# Patient Record
Sex: Female | Born: 1995
Health system: Southern US, Community
[De-identification: ages and names within clinical notes are randomized; demographics above are authoritative.]

## PROBLEM LIST (undated history)

## (undated) DIAGNOSIS — O24419 Gestational diabetes mellitus in pregnancy, unspecified control: Secondary | ICD-10-CM

## (undated) DIAGNOSIS — E7800 Pure hypercholesterolemia, unspecified: Secondary | ICD-10-CM

## (undated) DIAGNOSIS — I499 Cardiac arrhythmia, unspecified: Secondary | ICD-10-CM

## (undated) DIAGNOSIS — F419 Anxiety disorder, unspecified: Secondary | ICD-10-CM

## (undated) DIAGNOSIS — Z86718 Personal history of other venous thrombosis and embolism: Secondary | ICD-10-CM

## (undated) DIAGNOSIS — E559 Vitamin D deficiency, unspecified: Secondary | ICD-10-CM

## (undated) HISTORY — DX: Personal history of other venous thrombosis and embolism: Z86.718

## (undated) HISTORY — DX: Vitamin D deficiency, unspecified: E55.9

## (undated) HISTORY — DX: Pure hypercholesterolemia, unspecified: E78.00

## (undated) HISTORY — PX: NO PAST SURGERIES: SHX2092

---
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
## 2015-06-24 ENCOUNTER — Emergency Department: Payer: BLUE CROSS/BLUE SHIELD

## 2015-06-24 ENCOUNTER — Encounter: Payer: Self-pay | Admitting: Medical Oncology

## 2015-06-24 ENCOUNTER — Emergency Department
Admission: EM | Admit: 2015-06-24 | Discharge: 2015-06-24 | Disposition: A | Discharge status: Home / Self Care | Payer: BLUE CROSS/BLUE SHIELD | Attending: Emergency Medicine | Admitting: Emergency Medicine

## 2015-06-24 DIAGNOSIS — R0789 Other chest pain: Secondary | ICD-10-CM | POA: Diagnosis present

## 2015-06-24 DIAGNOSIS — F419 Anxiety disorder, unspecified: Secondary | ICD-10-CM | POA: Insufficient documentation

## 2015-06-24 HISTORY — DX: Anxiety disorder, unspecified: F41.9

## 2015-06-24 LAB — BASIC METABOLIC PANEL
Anion gap: 8 (ref 5–15)
BUN: 13 mg/dL (ref 6–20)
CALCIUM: 9.5 mg/dL (ref 8.9–10.3)
CO2: 25 mmol/L (ref 22–32)
CREATININE: 0.67 mg/dL (ref 0.44–1.00)
Chloride: 106 mmol/L (ref 101–111)
GFR calc Af Amer: >60 mL/min (ref >60)
GFR calc non Af Amer: >60 mL/min (ref >60)
GLUCOSE: 98 mg/dL (ref 65–99)
Potassium: 4 mmol/L (ref 3.5–5.1)
Sodium: 139 mmol/L (ref 135–145)

## 2015-06-24 LAB — CBC WITH DIFFERENTIAL/PLATELET
BASOS PCT: 1 %
Basophils Absolute: 0.1 10*3/uL (ref 0–0.1)
EOS ABS: 0 10*3/uL (ref 0–0.7)
Eosinophils Relative: 0 %
HEMATOCRIT: 38.5 % (ref 35.0–47.0)
Hemoglobin: 13.2 g/dL (ref 12.0–16.0)
Lymphocytes Relative: 12 %
Lymphs Abs: 1.2 10*3/uL (ref 1.0–3.6)
MCH: 30.1 pg (ref 26.0–34.0)
MCHC: 34.2 g/dL (ref 32.0–36.0)
MCV: 88.1 fL (ref 80.0–100.0)
MONO ABS: 0.7 10*3/uL (ref 0.2–0.9)
MONOS PCT: 7 %
NEUTROS ABS: 8.4 10*3/uL — AB (ref 1.4–6.5)
Neutrophils Relative %: 80 %
Platelets: 271 10*3/uL (ref 150–440)
RBC: 4.37 MIL/uL (ref 3.80–5.20)
RDW: 13.2 % (ref 11.5–14.5)
WBC: 10.5 10*3/uL (ref 3.6–11.0)

## 2015-06-24 LAB — TSH: TSH: 3.573 u[IU]/mL (ref 0.350–4.500)

## 2015-06-24 LAB — TROPONIN I: Troponin I: <0.03 ng/mL (ref <0.031)

## 2015-06-24 LAB — FIBRIN DERIVATIVES D-DIMER (ARMC ONLY): Fibrin derivatives D-dimer (ARMC): 207 (ref 0–499)

## 2015-06-24 MED ORDER — LORAZEPAM 2 MG/ML IJ SOLN
INTRAMUSCULAR | Status: AC
  Filled 2015-06-24: qty 1

## 2015-06-24 MED ORDER — LORAZEPAM 1 MG PO TABS
1.0000 mg | ORAL_TABLET | Freq: Once | ORAL | Status: AC
  Administered 2015-06-24: 1 mg via ORAL
  Filled 2015-06-24: qty 1

## 2015-06-24 MED ORDER — LORAZEPAM 1 MG PO TABS: 1.0000 mg | ORAL_TABLET | Freq: Three times a day (TID) | ORAL | Status: AC | PRN

## 2015-06-24 NOTE — ED Notes (Signed)
3 RN's attempted to draw blood from pt, unsuccessful, lab notified and asked to come to the bedside from lab draw

## 2015-06-24 NOTE — ED Notes (Signed)
Pt states she started zoloft yesterday, states a lot of anxiety with planning a wedding, a new job, and trying to buy a house, states some SOB, dizziness, and trouble swallowing, pt awake and alert in no acute distress, speaking in full clear sentances

## 2015-06-24 NOTE — ED Provider Notes (Signed)
6:06 PM on 06/24/2015 -----------------------------------------   I have reviewed the triage notes  Chief Complaint: Shortness of Breath and Chest Pain   History of Present Illness: Stephanie Johns is a 19 y.o. female who presents with feelings of anxiety while she was driving today. Patient states she started breathing very fast felt lightheaded, difficulty swallowing she's been seen by her primary physician is started on Zoloft and Nexium for possible reflux. Patient states she's only had 1 dose of each of the medications at this time. She denies any pulmonary emboli risk factors such as recent travel, birth control pills, history of deep venous thrombosis, etc. Patient denies any family history of early cardiovascular disease. She states she had some mild chest discomfort over the past week intermittently, but denies any persistent pain at this time.   Past Medical History  Diagnosis Date  . Anxiety     There are no active problems to display for this patient.   History reviewed. No pertinent past surgical history.  History reviewed. No pertinent past surgical history.  No current outpatient prescriptions on file.  Allergies:  Review of patient's allergies indicates no known allergies.  Family History: No family history on file.  Social History: Social History  Substance Use Topics  . Smoking status: Never Smoker   . Smokeless tobacco: None  . Alcohol Use: No     Review of Systems:   10 point review of systems was performed and was otherwise negative:  Constitutional: No fever Eyes: No visual disturbances ENT: No sore throat, ear pain Cardiac: No chest pain Respiratory: No shortness of breath, wheezing, or stridor Abdomen: No abdominal pain, no vomiting, No diarrhea Endocrine: No weight loss, No night sweats Extremities: No peripheral edema, cyanosis Skin: No rashes, easy bruising Neurologic: No focal weakness, trouble with speech or  swollowing Urologic: No dysuria, Hematuria, or urinary frequency   Physical Exam:  ED Triage Vitals  Enc Vitals Group     BP 06/24/15 1719 140/84 mmHg     Pulse Rate 06/24/15 1719 96     Resp 06/24/15 1719 20     Temp 06/24/15 1719 98.4 F (36.9 C)     Temp Source 06/24/15 1719 Oral     SpO2 06/24/15 1719 98 %     Weight 06/24/15 1719 205 lb (92.987 kg)     Height 06/24/15 1719 5\' 7"  (1.702 m)     Head Cir --      Peak Flow --      Pain Score 06/24/15 1720 0     Pain Loc --      Pain Edu? --      Excl. in GC? --     General: Awake , Alert , and Oriented times 3; GCS 15 Head: Normal cephalic , atraumatic Eyes: Pupils equal , round, reactive to light Nose/Throat: No nasal drainage, patent upper airway without erythema or exudate.  Neck: Supple, Full range of motion, No anterior adenopathy or palpable thyroid masses Lungs: Clear to ascultation without wheezes , rhonchi, or rales Heart: Regular rate, regular rhythm without murmurs , gallops , or rubs Abdomen: Soft, non tender without rebound, guarding , or rigidity; bowel sounds positive and symmetric in all 4 quadrants. No organomegaly .        Extremities: 2 plus symmetric pulses. No edema, clubbing or cyanosis Neurologic: normal ambulation, Motor symmetric without deficits, sensory intact Skin: warm, dry, no rashes   Labs:   All laboratory work was reviewed including  any pertinent negatives or positives listed below:  Labs Reviewed  BASIC METABOLIC PANEL  CBC WITH DIFFERENTIAL/PLATELET  TROPONIN I  TSH  FIBRIN DERIVATIVES D-DIMER ([facility] ONLY)   laboratory work was reviewed with no significant findings  EKG:  ED ECG REPORT I, Jennye Moccasin, the attending physician, personally viewed and interpreted this ECG.  Date: 06/24/2015 EKG Time: 1723 Rate: 79 Rhythm: normal sinus rhythm QRS Axis: normal Intervals: normal ST/T Wave abnormalities: normal Conduction Disutrbances: none Narrative Interpretation:  unremarkable Normal EKG   Radiology:  Narrative:    CLINICAL DATA: One week history of mid to right-sided chest pain.  EXAM: CHEST 2 VIEW  COMPARISON: None.  FINDINGS: The heart size and mediastinal contours are within normal limits. Both lungs are clear. The visualized skeletal structures are unremarkable.  IMPRESSION: Normal chest x-ray.           I personally reviewed the radiologic studies     ED Course:  Patient's stay here was uneventful and did not appear to have any clinical or objective findings of a immediate life threat causing her chest discomfort and dyspnea. Patient I felt most likely was having anxiety/panic attacks. Patient is currently on Zoloft and also Nexium for possible reflux and I added Ativan for the time being until her Zoloft becomes therapeutic. She was also given bedside consultation concerning abilities to calm herself down on her own without medication. She was advised to return here if she had any new concerns or any suicidal thoughts, homicidal thoughts, hallucinations.  Assessment:  Anxiety/panic attacks     Plan:  Outpatient management Patient was advised to return immediately if condition worsens. Patient was advised to follow up with her primary care physician or other specialized physicians involved and in their current assessment.             Jennye Moccasin, MD 06/24/15 2020

## 2015-06-24 NOTE — Discharge Instructions (Signed)
Panic Attacks Panic attacks are sudden, short feelings of great fear or discomfort. You may have them for no reason when you are relaxed, when you are uneasy (anxious), or when you are sleeping.  HOME CARE  Take all your medicines as told.  Check with your doctor before starting new medicines.  Keep all doctor visits. GET HELP IF:  You are not able to take your medicines as told.  Your symptoms do not get better.  Your symptoms get worse. GET HELP RIGHT AWAY IF:  Your attacks seem different than your normal attacks.  You have thoughts about hurting yourself or others.  You take panic attack medicine and you have a side effect. MAKE SURE YOU:  Understand these instructions.  Will watch your condition.  Will get help right away if you are not doing well or get worse.   This information is not intended to replace advice given to you by your health care provider. Make sure you discuss any questions you have with your health care provider.   Document Released: 09/01/2010 Document Revised: 05/20/2013 Document Reviewed: 03/13/2013 Elsevier Interactive Patient Education [employer]2016 [employer].  Please return immediately if condition worsens. Please contact her primary physician or the physician you were given for referral. If you have any specialist physicians involved in her treatment and plan please also contact them. Thank you for using [facility].

## 2015-06-24 NOTE — ED Notes (Signed)
Labs drawn by lab.

## 2015-06-24 NOTE — ED Notes (Signed)
Pt tearful in triage with reports that she has been having chest pain and sob x 1 week, went to PCP a few days ago and was started on zoloft. Pt reports that she feels anxious.

## 2016-01-06 ENCOUNTER — Encounter: Payer: Self-pay | Admitting: Emergency Medicine

## 2016-01-06 ENCOUNTER — Emergency Department: Payer: BLUE CROSS/BLUE SHIELD

## 2016-01-06 ENCOUNTER — Emergency Department
Admission: EM | Admit: 2016-01-06 | Discharge: 2016-01-06 | Disposition: A | Discharge status: Home / Self Care | Payer: BLUE CROSS/BLUE SHIELD | Attending: Emergency Medicine | Admitting: Emergency Medicine

## 2016-01-06 DIAGNOSIS — Z79899 Other long term (current) drug therapy: Secondary | ICD-10-CM | POA: Insufficient documentation

## 2016-01-06 DIAGNOSIS — O209 Hemorrhage in early pregnancy, unspecified: Secondary | ICD-10-CM | POA: Diagnosis present

## 2016-01-06 DIAGNOSIS — Z3A01 Less than 8 weeks gestation of pregnancy: Secondary | ICD-10-CM | POA: Diagnosis not present

## 2016-01-06 DIAGNOSIS — O200 Threatened abortion: Secondary | ICD-10-CM | POA: Diagnosis not present

## 2016-01-06 DIAGNOSIS — R58 Hemorrhage, not elsewhere classified: Secondary | ICD-10-CM

## 2016-01-06 LAB — CBC WITH DIFFERENTIAL/PLATELET
BASOS ABS: 0 10*3/uL (ref 0–0.1)
Basophils Relative: 1 %
Eosinophils Absolute: 0 10*3/uL (ref 0–0.7)
Eosinophils Relative: 0 %
HEMATOCRIT: 36.9 % (ref 35.0–47.0)
HEMOGLOBIN: 12.6 g/dL (ref 12.0–16.0)
Lymphs Abs: 1.8 10*3/uL (ref 1.0–3.6)
MCH: 29.5 pg (ref 26.0–34.0)
MCHC: 34.1 g/dL (ref 32.0–36.0)
MCV: 86.7 fL (ref 80.0–100.0)
MONO ABS: 0.8 10*3/uL (ref 0.2–0.9)
Monocytes Relative: 10 %
NEUTROS ABS: 5.4 10*3/uL (ref 1.4–6.5)
Platelets: 276 10*3/uL (ref 150–440)
RBC: 4.26 MIL/uL (ref 3.80–5.20)
RDW: 13.6 % (ref 11.5–14.5)
WBC: 8 10*3/uL (ref 3.6–11.0)

## 2016-01-06 LAB — HCG, QUANTITATIVE, PREGNANCY: HCG, BETA CHAIN, QUANT, S: 4 m[IU]/mL (ref <5)

## 2016-01-06 NOTE — ED Provider Notes (Signed)
[facility] Emergency Department Provider Note  ____________________________________________  Time seen: Approximately 3:32 PM  I have reviewed the triage vital signs and the nursing notes.   HISTORY  Chief Complaint Vaginal Bleeding    HPI Stephanie Johns is a 20 y.o. female who presents to the emergency department for evaluation of vaginal bleeding. She reports that she has had a positive pregnancy test. Her last menstrual cycle was 11/13/2015. She states that she has been spotting for the past 2 weeks with a cramping in her lower abdomen. Today she's had an increase in bleeding.She was started on Xarelto 3 months ago due to DVT. PCP consulted with Dr. Jean RosenthalJackson and she was started on Lovenox and stopped the Xarelto. Results from labs drawn earlier were HCG of 13.1.   Past Medical History  Diagnosis Date  . Anxiety     There are no active problems to display for this patient.   History reviewed. No pertinent past surgical history.  Current Outpatient Rx  Name  Route  Sig  Dispense  Refill  . enoxaparin (LOVENOX) 100 MG/ML injection   Subcutaneous   Inject 100 mg into the skin.         Marland Kitchen. LORazepam (ATIVAN) 1 MG tablet   Oral   Take 1 tablet (1 mg total) by mouth every 8 (eight) hours as needed for anxiety.   30 tablet   0     Allergies Review of patient's allergies indicates no known allergies.  No family history on file.  Social History Social History  Substance Use Topics  . Smoking status: Never Smoker   . Smokeless tobacco: None  . Alcohol Use: No    Review of Systems Constitutional: Negative for fever. Respiratory: Negative for shortness of breath or cough. Gastrointestinal: Positive for abdominal pain; negative for nausea , negative for vomiting. Genitourinary: Negative for dysuria , positive for vaginal bleeding. Musculoskeletal: Negative for back pain. Skin:  Atraumatic. ____________________________________________   PHYSICAL EXAM:  VITAL SIGNS: ED Triage Vitals  Enc Vitals Group     BP 01/06/16 1411 135/78 mmHg     Pulse Rate 01/06/16 1411 103     Resp 01/06/16 1411 18     Temp 01/06/16 1411 98.4 F (36.9 C)     Temp Source 01/06/16 1411 Oral     SpO2 01/06/16 1411 100 %     Weight 01/06/16 1411 220 lb (99.791 kg)     Height 01/06/16 1411 5\' 7"  (1.702 m)     Head Cir --      Peak Flow --      Pain Score 01/06/16 1412 5     Pain Loc --      Pain Edu? --      Excl. in GC? --     Constitutional: Alert and oriented. Well appearing and in no acute distress. Eyes: Conjunctivae are normal. PERRL. EOMI. Head: Atraumatic. Nose: No congestion/rhinnorhea. Mouth/Throat: Mucous membranes are moist. Respiratory: Normal respiratory effort.  No retractions. Gastrointestinal: Soft, no guarding or rebound tenderness. Mild suprapubic tenderness on exam. Genitourinary: Pelvic exam: Deferred Musculoskeletal: No extremity tenderness nor edema.  Neurologic:  Normal speech and language. No gross focal neurologic deficits are appreciated. Speech is normal. No gait instability. Skin:  Skin is warm, dry and intact. No rash noted. Psychiatric: Mood and affect are normal. Speech and behavior are normal.  ____________________________________________   LABS (all labs ordered are listed, but only abnormal results are displayed)  Labs Reviewed  HCG, QUANTITATIVE,  PREGNANCY  CBC WITH DIFFERENTIAL/PLATELET   ____________________________________________  RADIOLOGY  No IUP or ectopic pregnancy identified today. Per radiology. ____________________________________________   PROCEDURES  Procedure(s) performed: None  ____________________________________________   INITIAL IMPRESSION / ASSESSMENT AND PLAN / ED COURSE  Pertinent labs & imaging results that were available during my care of the patient were reviewed by me and considered in my medical  decision making (see chart for details).  She was advised to follow up with Dr. Jean Rosenthal. She was advised that she will need to have a repeat HCG and [place]. She was advised to adhere to pelvic rest while bleeding and/or cramping.  She was given strict return precautions to return to the ER for increase in bleeding or abdominal pain. ____________________________________________   FINAL CLINICAL IMPRESSION(S) / ED DIAGNOSES  Final diagnoses:  Threatened miscarriage in early pregnancy    Note:  This document was prepared using Dragon voice recognition software and may include unintentional dictation errors.   Chinita Pester, FNP 01/06/16 1727  Richardean Canal, MD 01/10/16 [id number]

## 2016-01-06 NOTE — Discharge Instructions (Signed)

## 2016-01-06 NOTE — ED Notes (Signed)
States she has had positive pregnancy test   Then developed some vaginal bleeding  States she did pass some clots  But also is currently on blood thinner

## 2016-01-06 NOTE — ED Notes (Addendum)
LMP April 2nd, Blood test to confirm pregnancy , x2 weeks spotting with cramping feeling, today increased bleeding "not a pad an hour, but close". Nausea x2 days. Was dx with DVT in left leg with current treatment with SQ Enoxaparin

## 2016-01-13 ENCOUNTER — Encounter: Payer: Self-pay | Admitting: Internal Medicine

## 2016-01-13 ENCOUNTER — Inpatient Hospital Stay: Payer: BLUE CROSS/BLUE SHIELD

## 2016-01-13 ENCOUNTER — Inpatient Hospital Stay: Payer: BLUE CROSS/BLUE SHIELD | Attending: Internal Medicine | Admitting: Internal Medicine

## 2016-01-13 VITALS — BP 123/85 | HR 96 | Temp 99.1°F | Resp 18 | Wt 221.8 lb

## 2016-01-13 DIAGNOSIS — O039 Complete or unspecified spontaneous abortion without complication: Secondary | ICD-10-CM

## 2016-01-13 DIAGNOSIS — F419 Anxiety disorder, unspecified: Secondary | ICD-10-CM | POA: Insufficient documentation

## 2016-01-13 DIAGNOSIS — D6859 Other primary thrombophilia: Secondary | ICD-10-CM

## 2016-01-13 DIAGNOSIS — Z86718 Personal history of other venous thrombosis and embolism: Secondary | ICD-10-CM | POA: Insufficient documentation

## 2016-01-13 DIAGNOSIS — Z7901 Long term (current) use of anticoagulants: Secondary | ICD-10-CM | POA: Insufficient documentation

## 2016-01-13 DIAGNOSIS — Z79899 Other long term (current) drug therapy: Secondary | ICD-10-CM

## 2016-01-13 LAB — COMPREHENSIVE METABOLIC PANEL
ALT: 67 U/L — AB (ref 14–54)
AST: 35 U/L (ref 15–41)
Albumin: 4.7 g/dL (ref 3.5–5.0)
Alkaline Phosphatase: 66 U/L (ref 38–126)
Anion gap: 8 (ref 5–15)
BILIRUBIN TOTAL: 0.4 mg/dL (ref 0.3–1.2)
BUN: 14 mg/dL (ref 6–20)
CO2: 24 mmol/L (ref 22–32)
CREATININE: 0.58 mg/dL (ref 0.44–1.00)
Calcium: 9.3 mg/dL (ref 8.9–10.3)
Chloride: 104 mmol/L (ref 101–111)
GFR calc Af Amer: >60 mL/min (ref >60)
Glucose, Bld: 102 mg/dL — ABNORMAL HIGH (ref 65–99)
POTASSIUM: 3.7 mmol/L (ref 3.5–5.1)
Sodium: 136 mmol/L (ref 135–145)
TOTAL PROTEIN: 8.4 g/dL — AB (ref 6.5–8.1)

## 2016-01-13 LAB — CBC WITH DIFFERENTIAL/PLATELET
BASOS ABS: 0.1 10*3/uL (ref 0–0.1)
Basophils Relative: 1 %
Eosinophils Absolute: 0 10*3/uL (ref 0–0.7)
Eosinophils Relative: 0 %
HEMATOCRIT: 36.5 % (ref 35.0–47.0)
Hemoglobin: 12.8 g/dL (ref 12.0–16.0)
LYMPHS ABS: 2.5 10*3/uL (ref 1.0–3.6)
LYMPHS PCT: 25 %
MCH: 30.2 pg (ref 26.0–34.0)
MCHC: 35 g/dL (ref 32.0–36.0)
MCV: 86.1 fL (ref 80.0–100.0)
MONO ABS: 0.8 10*3/uL (ref 0.2–0.9)
Monocytes Relative: 8 %
NEUTROS ABS: 6.4 10*3/uL (ref 1.4–6.5)
Neutrophils Relative %: 66 %
Platelets: 282 10*3/uL (ref 150–440)
RBC: 4.24 MIL/uL (ref 3.80–5.20)
RDW: 13.6 % (ref 11.5–14.5)
WBC: 9.9 10*3/uL (ref 3.6–11.0)

## 2016-01-13 LAB — LACTATE DEHYDROGENASE: LDH: 173 U/L (ref 98–192)

## 2016-01-13 NOTE — Progress Notes (Signed)
[facility] CONSULT NOTE  Patient Care Team: Imelda Pillow, NP as PCP - General (Nurse Practitioner)  CHIEF COMPLAINTS/PURPOSE OF CONSULTATION:   # LEFT LE DVT [March 31st 2017]- small segmental thrombus in GSV/ partial thrombus in distal segment of SFV; May 2017- chronic thrombus in SFV  HISTORY OF PRESENTING ILLNESS:  Stephanie Johns 20 y.o.  female pleasant patient referred to [place] for further evaluation and recommendations for DVT of the left lower extremity.  Patient noted to have cramping in her left lower extremity- which led to an ultrasound as described above- noted to have DVT. Patient was placed on Xarelto. She had been tolerating it fairly well.  Approximate 2 weeks ago patient was confirmed pregnant; when Xarelto was switched over to Lovenox. However more recently patient had spontaneous abortion. She is currently on Lovenox.  Patient denies otherwise any red stools black stools. Denies any previous blood clots. Denies any family history of blood clots.  ROS: A complete 10 point review of system is done which is negative except mentioned above in history of present illness  MEDICAL HISTORY:  Past Medical History  Diagnosis Date  . Anxiety     SURGICAL HISTORY: No past surgical history on file.  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 No alcohol. Social History   Social History  . Marital Status: Single    Spouse Name: N/A  . Number of Children: N/A  . Years of Education: N/A   Occupational History  . Not on file.   Social History Main Topics  . Smoking status: Never Smoker   . Smokeless tobacco: Not on file  . Alcohol Use: No  . Drug Use: No  . Sexual Activity: Not on file   Other Topics Concern  . Not on file   Social History Narrative    FAMILY HISTORY:No blood clots in the family. No family history on file.  ALLERGIES:  has No Known Allergies.  MEDICATIONS:  Current Outpatient Prescriptions  Medication Sig Dispense  Refill  . enoxaparin (LOVENOX) 100 MG/ML injection Inject 100 mg into the skin.    Marland Kitchen LORazepam (ATIVAN) 1 MG tablet Take 1 tablet (1 mg total) by mouth every 8 (eight) hours as needed for anxiety. (Patient not taking: Reported on 01/13/2016) 30 tablet 0  . XARELTO 20 MG TABS tablet Reported on 01/13/2016  1   No current facility-administered medications for this visit.      Marland Kitchen  PHYSICAL EXAMINATION:   Filed Vitals:   01/13/16 1343  BP: 123/85  Pulse: 96  Temp: 99.1 F (37.3 C)  Resp: 18   Filed Weights   01/13/16 1343  Weight: 221 lb 12.5 oz (100.6 kg)    GENERAL: Well-nourished well-developed; Alert, no distress and comfortable. With family.  EYES: no pallor or icterus OROPHARYNX: no thrush or ulceration; good dentition  NECK: supple, no masses felt LYMPH:  no palpable lymphadenopathy in the cervical, axillary or inguinal regions LUNGS: clear to auscultation and  No wheeze or crackles HEART/CVS: regular rate & rhythm and no murmurs; No lower extremity edema ABDOMEN: abdomen soft, non-tender and normal bowel sounds Musculoskeletal:no cyanosis of digits and no clubbing  PSYCH: alert & oriented x 3 with fluent speech NEURO: no focal motor/sensory deficits SKIN:  no rashes or significant lesions  LABORATORY DATA:  I have reviewed the data as listed Lab Results  Component Value Date   WBC 9.9 01/13/2016   HGB 12.8 01/13/2016   HCT 36.5 01/13/2016   MCV  86.1 01/13/2016   PLT 282 01/13/2016    Recent Labs  06/24/15 1852 01/13/16 1410  NA 139 136  K 4.0 3.7  CL 106 104  CO2 25 24  GLUCOSE 98 102*  BUN 13 14  CREATININE 0.67 0.58  CALCIUM 9.5 9.3  GFRNONAA >60 >60  GFRAA >60 >60  PROT  --  8.4*  ALBUMIN  --  4.7  AST  --  35  ALT  --  67*  ALKPHOS  --  66  BILITOT  --  0.4    RADIOGRAPHIC STUDIES: I have personally reviewed the radiological images as listed and agreed with the findings in the report. US Ob Comp Less 14 Wks  01/06/2016  CLINICAL DATA:   Cramping and bleeding. Nausea for 2 days. Quantitative beta HCG is 4.0. LMP was 11/21/2015. Gestational age by LMP is6 weeks 4 days. EDC by LMP is01/15/2018 the. EXAM: OBSTETRIC <14 WK [place] AND TRANSVAGINAL OB US TECHNIQUE: Both transabdominal and transvaginal ultrasound examinations were performed for complete evaluation of the gestation as well as the maternal uterus, adnexal regions, and pelvic cul-de-sac. Transvaginal technique was performed to assess early pregnancy. COMPARISON:  None. FINDINGS: Intrauterine gestational sac: Not seen Yolk sac:  Not seen Embryo:  Not seen Cardiac Activity: Not seen Subchorionic hemorrhage:  None visualized. Maternal uterus/adnexae: Ovaries have a normal appearance. Endometrial thickening is noted. IMPRESSION: 1. No intrauterine or ectopic pregnancy identified. 2. Considerations include recent spontaneous abortion, intrauterine pregnancy too small to be visualized, or early ectopic pregnancy too small to visualized. 3. Serial quantitative beta HCG values and follow-up ultrasound are recommended as appropriate to document progression of and location of pregnancy. Ectopic pregnancy has not been excluded. Electronically Signed   By: Norva Pavlov M.D.   On: 01/06/2016 17:04   US Ob Transvaginal  01/06/2016  CLINICAL DATA:  Cramping and bleeding. Nausea for 2 days. Quantitative beta HCG is 4.0. LMP was 11/21/2015. Gestational age by LMP is6 weeks 4 days. EDC by LMP is01/15/2018 the. EXAM: OBSTETRIC <14 WK [place] AND TRANSVAGINAL OB US TECHNIQUE: Both transabdominal and transvaginal ultrasound examinations were performed for complete evaluation of the gestation as well as the maternal uterus, adnexal regions, and pelvic cul-de-sac. Transvaginal technique was performed to assess early pregnancy. COMPARISON:  None. FINDINGS: Intrauterine gestational sac: Not seen Yolk sac:  Not seen Embryo:  Not seen Cardiac Activity: Not seen Subchorionic hemorrhage:  None visualized. Maternal  uterus/adnexae: Ovaries have a normal appearance. Endometrial thickening is noted. IMPRESSION: 1. No intrauterine or ectopic pregnancy identified. 2. Considerations include recent spontaneous abortion, intrauterine pregnancy too small to be visualized, or early ectopic pregnancy too small to visualized. 3. Serial quantitative beta HCG values and follow-up ultrasound are recommended as appropriate to document progression of and location of pregnancy. Ectopic pregnancy has not been excluded. Electronically Signed   By: Norva Pavlov M.D.   On: 01/06/2016 17:04    ASSESSMENT & PLAN:   # Unprovoked DVT below the knee left lower extremity- on Xarelto; most recently on Lovenox [recent pregnancy diagnosis]. The etiology is unclear. Recommend antiphospholipid antibody panel prothrombin gene mutation factor V Leiden. Discussed with the patient that in spite of the workup- still might be idiopathic. However I would recommend only a total of 3 months of anticoagulation. We will discussed regarding stopping anticoagulation at next visit.  # Recent pregnancy/ spontaneous abortion. Clinically does not appear to be related to antiphospholipid antibody syndrome. Await above workup. Check CBC CMP and LDH.  All questions were answered. The patient knows to call the clinic with any problems, questions or concerns. Patient will follow-up in approximately 2 weeks to review the results.  Thank you Dr.Khan for allowing me to participate in the care of your pleasant patient. Please do not hesitate to contact me with questions or concerns in the interim.  # 30 minutes face-to-face with the patient discussing the above plan of care; more than 50% of time spent on counseling and coordination.    Earna CoderGovinda R Delara Shepheard, MD 01/13/2016 4:22 PM

## 2016-01-13 NOTE — Progress Notes (Signed)
Patient here today as new evaluation for DVT.  Referred by Rockney Gheehelsea Holland, NP @ [facility].  Patient states she had a miscarriage last Friday.  She had a a DVT behind left knee since March. Patient on 100 mg Lovenox twice daily. Has script for Xarelto but has not filled it due to being on Lovenox.

## 2016-01-18 LAB — FACTOR 5 LEIDEN

## 2016-01-18 LAB — HEXAGONAL PHASE PHOSPHOLIPID: HEXAGONAL PHASE PHOSPHOLIPID: 2 s (ref 0–11)

## 2016-01-18 LAB — ANTIPHOSPHOLIPID SYNDROME PROF
DRVVT: 32.8 s (ref 0.0–47.0)
PTT LA: 46.4 s — AB (ref 0.0–43.6)

## 2016-01-18 LAB — PROTHROMBIN GENE MUTATION

## 2016-01-18 LAB — PTT-LA MIX: PTT-LA Mix: 44.5 s — ABNORMAL HIGH (ref 0.0–40.6)

## 2016-02-02 ENCOUNTER — Inpatient Hospital Stay (HOSPITAL_BASED_OUTPATIENT_CLINIC_OR_DEPARTMENT_OTHER): Payer: BLUE CROSS/BLUE SHIELD | Admitting: Internal Medicine

## 2016-02-02 VITALS — BP 141/87 | HR 113 | Temp 98.1°F | Resp 18 | Wt 223.1 lb

## 2016-02-02 DIAGNOSIS — Z86718 Personal history of other venous thrombosis and embolism: Secondary | ICD-10-CM

## 2016-02-02 DIAGNOSIS — Z79899 Other long term (current) drug therapy: Secondary | ICD-10-CM | POA: Diagnosis not present

## 2016-02-02 DIAGNOSIS — I824Z2 Acute embolism and thrombosis of unspecified deep veins of left distal lower extremity: Secondary | ICD-10-CM | POA: Insufficient documentation

## 2016-02-02 DIAGNOSIS — Z7901 Long term (current) use of anticoagulants: Secondary | ICD-10-CM

## 2016-02-02 NOTE — Progress Notes (Signed)
[facility] CONSULT NOTE  Patient Care Team: Imelda Pillowhelsa Holland, NP as PCP - General (Nurse Practitioner)  CHIEF COMPLAINTS/PURPOSE OF CONSULTATION:   # LEFT LE DVT [March 31st 2017]- small segmental thrombus in GSV/ partial thrombus in distal segment of SFV; May 2017- chronic thrombus in SFV  HISTORY OF PRESENTING ILLNESS:  Burton ApleyKristen N Conklin 20 y.o.  female pleasant patient with history for DVT of the left lower extremity/review the results of her blood work. Patient had initially been on Xarelto; given the early pregnancy she was switched over to Lovenox. Patient continues to be on Lovenox at this time. Patient had an abortion.   Patient denies otherwise any red stools black stools. Denies any previous blood clots. Denies any family history of blood clots.  ROS: A complete 10 point review of system is done which is negative except mentioned above in history of present illness  MEDICAL HISTORY:  Past Medical History  Diagnosis Date  . Anxiety     SURGICAL HISTORY: No past surgical history on file.  SOCIAL HISTORY: Works Industrial/product designermedical office. No smoking. No alcohol. Social History   Social History  . Marital Status: Single    Spouse Name: N/A  . Number of Children: N/A  . Years of Education: N/A   Occupational History  . Not on file.   Social History Main Topics  . Smoking status: Never Smoker   . Smokeless tobacco: Not on file  . Alcohol Use: No  . Drug Use: No  . Sexual Activity: Not on file   Other Topics Concern  . Not on file   Social History Narrative    FAMILY HISTORY:No blood clots in the family. No family history on file.  ALLERGIES:  has No Known Allergies.  MEDICATIONS:  Current Outpatient Prescriptions  Medication Sig Dispense Refill  . enoxaparin (LOVENOX) 100 MG/ML injection Inject 100 mg into the skin.    Marland Kitchen. LORazepam (ATIVAN) 1 MG tablet Take 1 tablet (1 mg total) by mouth every 8 (eight) hours as needed for anxiety. 30 tablet 0  .  XARELTO 20 MG TABS tablet Reported on 01/13/2016  1   No current facility-administered medications for this visit.      Marland Kitchen.  PHYSICAL EXAMINATION:   Filed Vitals:   02/02/16 1531  BP: 141/87  Pulse: 113  Temp: 98.1 F (36.7 C)  Resp: 18   Filed Weights   02/02/16 1531  Weight: 223 lb 1.7 oz (101.2 kg)    GENERAL: Well-nourished well-developed; Alert, no distress and comfortable. With family.    LABORATORY DATA:  I have reviewed the data as listed Lab Results  Component Value Date   WBC 9.9 01/13/2016   HGB 12.8 01/13/2016   HCT 36.5 01/13/2016   MCV 86.1 01/13/2016   PLT 282 01/13/2016    Recent Labs  06/24/15 1852 01/13/16 1410  NA 139 136  K 4.0 3.7  CL 106 104  CO2 25 24  GLUCOSE 98 102*  BUN 13 14  CREATININE 0.67 0.58  CALCIUM 9.5 9.3  GFRNONAA >60 >60  GFRAA >60 >60  PROT  --  8.4*  ALBUMIN  --  4.7  AST  --  35  ALT  --  67*  ALKPHOS  --  66  BILITOT  --  0.4    RADIOGRAPHIC STUDIES: I have personally reviewed the radiological images as listed and agreed with the findings in the report. [place] Ob Comp Less 14 Wks  01/06/2016  CLINICAL DATA:  Cramping and bleeding. Nausea for 2 days. Quantitative beta HCG is 4.0. LMP was 11/21/2015. Gestational age by LMP is6 weeks 4 days. EDC by LMP is01/15/2018 the. EXAM: OBSTETRIC <14 WK US AND TRANSVAGINAL OB US TECHNIQUE: Both transabdominal and transvaginal ultrasound examinations were performed for complete evaluation of the gestation as well as the maternal uterus, adnexal regions, and pelvic cul-de-sac. Transvaginal technique was performed to assess early pregnancy. COMPARISON:  None. FINDINGS: Intrauterine gestational sac: Not seen Yolk sac:  Not seen Embryo:  Not seen Cardiac Activity: Not seen Subchorionic hemorrhage:  None visualized. Maternal uterus/adnexae: Ovaries have a normal appearance. Endometrial thickening is noted. IMPRESSION: 1. No intrauterine or ectopic pregnancy identified. 2. Considerations  include recent spontaneous abortion, intrauterine pregnancy too small to be visualized, or early ectopic pregnancy too small to visualized. 3. Serial quantitative beta HCG values and follow-up ultrasound are recommended as appropriate to document progression of and location of pregnancy. Ectopic pregnancy has not been excluded. Electronically Signed   By: Norva PavlovElizabeth  Brown M.D.   On: 01/06/2016 17:04   [place] Ob Transvaginal  01/06/2016  CLINICAL DATA:  Cramping and bleeding. Nausea for 2 days. Quantitative beta HCG is 4.0. LMP was 11/21/2015. Gestational age by LMP is6 weeks 4 days. EDC by LMP is01/15/2018 the. EXAM: OBSTETRIC <14 WK US AND TRANSVAGINAL OB US TECHNIQUE: Both transabdominal and transvaginal ultrasound examinations were performed for complete evaluation of the gestation as well as the maternal uterus, adnexal regions, and pelvic cul-de-sac. Transvaginal technique was performed to assess early pregnancy. COMPARISON:  None. FINDINGS: Intrauterine gestational sac: Not seen Yolk sac:  Not seen Embryo:  Not seen Cardiac Activity: Not seen Subchorionic hemorrhage:  None visualized. Maternal uterus/adnexae: Ovaries have a normal appearance. Endometrial thickening is noted. IMPRESSION: 1. No intrauterine or ectopic pregnancy identified. 2. Considerations include recent spontaneous abortion, intrauterine pregnancy too small to be visualized, or early ectopic pregnancy too small to visualized. 3. Serial quantitative beta HCG values and follow-up ultrasound are recommended as appropriate to document progression of and location of pregnancy. Ectopic pregnancy has not been excluded. Electronically Signed   By: Norva PavlovElizabeth  Brown M.D.   On: 01/06/2016 17:04    ASSESSMENT & PLAN:   Acute deep vein thrombosis (DVT) of distal vein of left lower extremity (HCC) Unclear etiology hypercoagulable workup- prothrombin gene mutation factor V Leiden; antiphospholipid antibody panel negative. Discussed at length with the  patient and family. Patient was given a copy of her labs.  # Given the distal/below knee DVT- I recommend anticoagulation approximately 3-4 months. Patient has finished anticoagulation for 4 months now. Recommend stopping anticoagulation at this time.  # With regards to birth control pills- I would recommend against estrogen-based pills. However, given the risk versus benefit- progestin based BCPs could be prescribed. Patient is again aware that side high-risk of blood clots on any hormone-based therapy. I would recommend if started 6 months from now.     # 15 minutes face-to-face with the patient discussing the above plan of care; more than 50% of time spent on natural history; counseling and coordination.     Earna CoderGovinda R Nicklaus Alviar, MD 02/02/2016 3:36 PM

## 2016-02-02 NOTE — Assessment & Plan Note (Addendum)
Unclear etiology hypercoagulable workup- prothrombin gene mutation factor V Leiden; antiphospholipid antibody panel negative. Discussed at length with the patient and family. Patient was given a copy of her labs.  # Given the distal/below knee DVT- I recommend anticoagulation approximately 3-4 months. Patient has finished anticoagulation for 4 months now. Recommend stopping anticoagulation at this time. Also reviewed the ultrasound done in June 2017 lower leg that she has chronic blood clot.  # With regards to birth control pills- I would recommend against estrogen-based pills. However, given the risk versus benefit- progestin based BCPs could be prescribed. Patient is again aware that side high-risk of blood clots on any hormone-based therapy. I would recommend if started 6 months from now.

## 2016-09-14 ENCOUNTER — Ambulatory Visit (INDEPENDENT_AMBULATORY_CARE_PROVIDER_SITE_OTHER): Payer: 59 | Admitting: Adult Health

## 2016-09-14 ENCOUNTER — Encounter: Payer: Self-pay | Admitting: Adult Health

## 2016-09-14 VITALS — BP 108/70 | HR 86 | Ht 67.0 in | Wt 215.5 lb

## 2016-09-14 DIAGNOSIS — Z86718 Personal history of other venous thrombosis and embolism: Secondary | ICD-10-CM | POA: Insufficient documentation

## 2016-09-14 DIAGNOSIS — N926 Irregular menstruation, unspecified: Secondary | ICD-10-CM | POA: Insufficient documentation

## 2016-09-14 DIAGNOSIS — N946 Dysmenorrhea, unspecified: Secondary | ICD-10-CM | POA: Diagnosis not present

## 2016-09-14 DIAGNOSIS — Z319 Encounter for procreative management, unspecified: Secondary | ICD-10-CM | POA: Insufficient documentation

## 2016-09-14 DIAGNOSIS — Z3202 Encounter for pregnancy test, result negative: Secondary | ICD-10-CM

## 2016-09-14 LAB — POCT URINE PREGNANCY: Preg Test, Ur: NEGATIVE

## 2016-09-14 MED ORDER — PRENATAL PLUS/IRON 27-1 MG PO TABS: ORAL_TABLET | ORAL | 12 refills | Status: DC

## 2016-09-14 NOTE — Progress Notes (Signed)
Subjective:     Patient ID: Stephanie Johns, female   DOB: [birth year], 21 y.o.   MRN: [id number]  HPI Stephanie Johns is a 21 year old white female,married in because period in 3 weeks late, but spotting brown started 1/30. Periods painful at times and LLQ pain at times.No pain with sex.She had DVT last year.Has been married for a year and got pregnant on honeymoon and miscarried. Her mom is worried she may have ovarian cyst.  PCP is Orson Evahelsa Boswell, [place] in [place].  Review of Systems Period late Has pain with periods and some LLQ at times No pain with sex Reviewed past medical,surgical, social and family history. Reviewed medications and allergies.     Objective:   Physical Exam BP 108/70 (BP Location: Left Arm, Patient Position: Sitting, Cuff Size: Normal)   Pulse 86   Ht 5\' 7"  (1.702 m)   Wt 215 lb 8 oz (97.8 kg)   LMP 09/11/2016 Comment: spotting  BMI 33.75 kg/m UPT negative. PHQ 2 score 1. Skin warm and dry.Pelvic: external genitalia is normal in appearance no lesions, vagina: brown blood,urethra has no lesions or masses noted, cervix:smooth and nulliparous, uterus: normal size, shape and contour, non tender, no masses felt, adnexa: no masses or tenderness noted. Bladder is non tender and no masses felt.    Discussed timing of sex, and ovulation, stop lipitor and can take fish oil, will rx prenatal vitamins, she is aware will have to be on lovenox when pregnant due to prior DVT. Face time 30 minutes with 50% counseling, see above.  Assessment:     1. Menstrual period late   2. Pregnancy examination or test, negative result   3. Dysmenorrhea   4. History of DVT (deep vein thrombosis)   5. Patient desires pregnancy       Plan:     Rx prenatal plus #30 take 1 daily with 12 refills Stop Lipitor,can take fish oil Keep period calendar Call me when period starts,will check progesterone level day 21 of cycle Discussed timing of sex  Review handout on preparing for pregnancy Follow  up in 3 months

## 2016-09-14 NOTE — Patient Instructions (Addendum)
Keep period calendar Call me when period starts,will check progesterone level day 21 of cycle Discussed timing of sex  Stop lipitor Fish oil is fine  Preparing for Pregnancy If you are considering becoming pregnant, make an appointment to see your regular health care provider to learn how to prepare for a safe and healthy pregnancy (preconception care). During a preconception care visit, your health care provider will:  Do a complete physical exam, including a Pap test.  Take a complete medical history.  Give you information, answer your questions, and help you resolve problems. Preconception checklist Medical history  Tell your health care provider about any current or past medical conditions. Your pregnancy or your ability to become pregnant may be affected by chronic conditions, such as diabetes, chronic hypertension, and thyroid problems.  Include your family's medical history as well as your partner's medical history.  Tell your health care provider about any history of STIs (sexually transmitted infections).These can affect your pregnancy. In some cases, they can be passed to your baby. Discuss any concerns that you have about STIs.  If indicated, discuss the benefits of genetic testing. This testing will show whether there are any genetic conditions that may be passed from you or your partner to your baby.  Tell your health care provider about:  Any problems you have had with conception or pregnancy.  Any medicines you take. These include vitamins, herbal supplements, and over-the-counter medicines.  Your history of immunizations. Discuss any vaccinations that you may need. Diet  Ask your health care provider what to include in a healthy diet that has a balance of nutrients. This is especially important when you are pregnant or preparing to become pregnant.  Ask your health care provider to help you reach a healthy weight before pregnancy.  If you are overweight, you may  be at higher risk for certain complications, such as high blood pressure, diabetes, and preterm birth.  If you are underweight, you are more likely to have a baby who has a low birth weight. Lifestyle, work, and home  Let your health care provider know:  About any lifestyle habits that you have, such as alcohol use, drug use, or smoking.  About recreational activities that may put you at risk during pregnancy, such as downhill skiing and certain exercise programs.  Tell your health care provider about any international travel, especially any travel to places with an active [place] virus outbreak.  About harmful substances that you may be exposed to at work or at home. These include chemicals, pesticides, radiation, or even litter boxes.  If you do not feel safe at home. Mental health  Tell your health care provider about:  Any history of mental health conditions, including feelings of depression, sadness, or anxiety.  Any medicines that you take for a mental health condition. These include herbs and supplements. Home instructions to prepare for pregnancy  Lifestyle  Eat a balanced diet. This includes fresh fruits and vegetables, whole grains, lean meats, low-fat dairy products, healthy fats, and foods that are high in fiber. Ask to meet with a nutritionist or registered dietitian for assistance with meal planning and goals.  Get regular exercise. Try to be active for at least 30 minutes a day on most days of the week. Ask your health care provider which activities are safe during pregnancy.  Do not use any products that contain nicotine or tobacco, such as cigarettes and e-cigarettes. If you need help quitting, ask your health care provider.  Do  not drink alcohol.  Do not take illegal drugs.  Maintain a healthy weight. Ask your health care provider what weight range is right for you. General instructions  Keep an accurate record of your menstrual periods. This makes it easier for  your health care provider to determine your baby's due date.  Begin taking prenatal vitamins and folic acid supplements daily as directed by your health care provider.  Manage any chronic conditions, such as high blood pressure and diabetes, as told by your health care provider. This is important. How do I know that I am pregnant? You may be pregnant if you have been sexually active and you miss your period. Symptoms of early pregnancy include:  Mild cramping.  Very light vaginal bleeding (spotting).  Feeling unusually tired.  Nausea and vomiting (morning sickness). If you have any of these symptoms and you suspect that you might be pregnant, you can take a home pregnancy test. These tests check for a hormone in your urine (human chorionic gonadotropin, or hCG). A woman's body begins to make this hormone during early pregnancy. These tests are very accurate. Wait until at least the first day after you miss your period to take one. If the test shows that you are pregnant (you get a positive result), call your health care provider to make an appointment for prenatal care. What should I do if I become pregnant?  Make an appointment with your health care provider as soon as you suspect you are pregnant.  Do not use any products that contain nicotine, such as cigarettes, chewing tobacco, and e-cigarettes. If you need help quitting, ask your health care provider.  Do not drink alcoholic beverages. Alcohol is related to a number of birth defects.  Avoid toxic odors and chemicals.  You may continue to have sexual intercourse if it does not cause pain or other problems, such as vaginal bleeding. This information is not intended to replace advice given to you by your health care provider. Make sure you discuss any questions you have with your health care provider. Document Released: 07/12/2008 Document Revised: 03/27/2016 Document Reviewed: 02/19/2016 Elsevier Interactive Patient Education  2017  [employer]

## 2016-12-10 ENCOUNTER — Ambulatory Visit: Payer: 59 | Admitting: Adult Health

## 2016-12-12 ENCOUNTER — Ambulatory Visit: Payer: 59 | Admitting: Adult Health

## 2017-04-24 ENCOUNTER — Telehealth: Payer: Self-pay | Admitting: Adult Health

## 2017-04-24 NOTE — Telephone Encounter (Signed)
Left message that pt needs to schedule appt. JSY

## 2017-04-24 NOTE — Telephone Encounter (Signed)
Spoke with pt. Pt was last seen in Feb and states you mentioned her having some labs checked. Does she need another appt since it's been a while since she was seen? And if she don't need to be seen, she would like STD screening labs also. Thanks!! JSY

## 2017-04-24 NOTE — Telephone Encounter (Signed)
Patient called stating that she needs Victorino DikeJennifer to place order to the labs. Pt wants them done at her job. Pt also would like an STD screening as well. Please contact pt

## 2017-04-24 NOTE — Telephone Encounter (Signed)
Left message x 1. JSY 

## 2017-04-30 ENCOUNTER — Ambulatory Visit (INDEPENDENT_AMBULATORY_CARE_PROVIDER_SITE_OTHER): Payer: 59 | Admitting: Adult Health

## 2017-04-30 ENCOUNTER — Encounter: Payer: Self-pay | Admitting: Adult Health

## 2017-04-30 VITALS — BP 100/60 | HR 76 | Ht 66.0 in | Wt 206.0 lb

## 2017-04-30 DIAGNOSIS — Z319 Encounter for procreative management, unspecified: Secondary | ICD-10-CM

## 2017-04-30 DIAGNOSIS — Z113 Encounter for screening for infections with a predominantly sexual mode of transmission: Secondary | ICD-10-CM

## 2017-04-30 NOTE — Progress Notes (Signed)
Subjective:     Patient ID: Stephanie Johns, female   DOB: 07-10-1996, 21 y.o.   MRN: [id number]  HPI Stephanie Johns is a 21 year old white female, married, who wants to get pregnant in near future, but afraid since had miscarriage last time. And she wants STD testing.   Review of Systems Periods regular Reviewed past medical,surgical, social and family history. Reviewed medications and allergies.     Objective:   Physical Exam BP 100/60 (BP Location: Left Arm, Patient Position: Sitting, Cuff Size: Small)   Pulse 76   Ht  (1.676 m)   Wt 206 lb (93.4 kg)   LMP 04/18/2017   BMI 33.25 kg/m   PHQ 2 score 0. Talk only, periods every month, still wants to be pregnant. Will check progesterone level 9/26, take regular sex as discussed at prior viist.    Assessment:     1. Patient desires pregnancy   2. Screening examination for STD (sexually transmitted disease)       Plan:    GC/CHL sent on urine Check HIV,RPR and progesterone level 9/26   Return in 4 weeks for pap and physical

## 2017-05-01 ENCOUNTER — Telehealth: Payer: Self-pay | Admitting: Adult Health

## 2017-05-01 LAB — GC/CHLAMYDIA PROBE AMP
Chlamydia trachomatis, NAA: NEGATIVE
Neisseria gonorrhoeae by PCR: NEGATIVE

## 2017-05-01 NOTE — Telephone Encounter (Signed)
Pt aware that GC/CHL both negative

## 2017-05-10 LAB — RPR: RPR: NONREACTIVE

## 2017-05-10 LAB — PROGESTERONE: PROGESTERONE: 0.8 ng/mL

## 2017-05-10 LAB — HIV ANTIBODY (ROUTINE TESTING W REFLEX): HIV Screen 4th Generation wRfx: NONREACTIVE

## 2017-05-13 ENCOUNTER — Telehealth: Payer: Self-pay | Admitting: Adult Health

## 2017-05-13 NOTE — Telephone Encounter (Signed)
No voice mail, if she calls, she did not ovulate and HIV and syphilis both negative.

## 2017-05-21 ENCOUNTER — Telehealth: Payer: Self-pay | Admitting: Adult Health

## 2017-05-21 NOTE — Telephone Encounter (Signed)
Informed patient of negative results and that she did not ovulate. Patient states she has appointment on 10/18 and wanted to discuss in more detail at that appointment.

## 2017-05-21 NOTE — Telephone Encounter (Signed)
patient called stating that she needs to speak with a nurse, pt did not state the reason why. Please contact pt

## 2017-05-29 ENCOUNTER — Other Ambulatory Visit: Payer: 59 | Admitting: Adult Health

## 2017-05-30 ENCOUNTER — Ambulatory Visit (INDEPENDENT_AMBULATORY_CARE_PROVIDER_SITE_OTHER): Payer: 59 | Admitting: Adult Health

## 2017-05-30 ENCOUNTER — Encounter: Payer: Self-pay | Admitting: Adult Health

## 2017-05-30 ENCOUNTER — Other Ambulatory Visit (HOSPITAL_COMMUNITY)
Admission: RE | Admit: 2017-05-30 | Discharge: 2017-05-30 | Disposition: A | Discharge status: Home / Self Care | Payer: 59 | Source: Ambulatory Visit | Attending: Adult Health | Admitting: Adult Health

## 2017-05-30 VITALS — BP 120/50 | HR 87 | Ht 66.5 in | Wt 205.0 lb

## 2017-05-30 DIAGNOSIS — Z01419 Encounter for gynecological examination (general) (routine) without abnormal findings: Secondary | ICD-10-CM | POA: Diagnosis not present

## 2017-05-30 DIAGNOSIS — N946 Dysmenorrhea, unspecified: Secondary | ICD-10-CM | POA: Diagnosis not present

## 2017-05-30 DIAGNOSIS — Z01411 Encounter for gynecological examination (general) (routine) with abnormal findings: Secondary | ICD-10-CM | POA: Diagnosis not present

## 2017-05-30 DIAGNOSIS — Z319 Encounter for procreative management, unspecified: Secondary | ICD-10-CM | POA: Diagnosis not present

## 2017-05-30 DIAGNOSIS — Z86718 Personal history of other venous thrombosis and embolism: Secondary | ICD-10-CM

## 2017-05-30 NOTE — Progress Notes (Signed)
Patient ID: Stephanie Johns, female   DOB: Feb 20, 1996, 21 y.o.   MRN: [id number] History of Present Illness: Stephanie Johns is a 21 year old white female in of well woman gyn exam and pap.    Current Medications, Allergies, Past Medical History, Past Surgical History, Family History and Social History were reviewed in [employer] record.     Review of Systems:  Patient denies any headaches, hearing loss, fatigue, blurred vision, shortness of breath, chest pain, abdominal pain, problems with bowel movements, urination(being treated for UTI), or intercourse. No joint pain or mood swings. Periods last about 7 days and 4 days may be heavy and changes pads every 3-4 hours, clots are small, but has bad cramps, range from 5-9/10.   Physical Exam:BP (!) 120/50 (BP Location: Left Arm, Patient Position: Sitting, Cuff Size: Large)   Pulse 87   Ht 5' 6.5" (1.689 m)   Wt 205 lb (93 kg)   LMP 05/21/2017 (Approximate)   BMI 32.59 kg/m  General:  Well developed, well nourished, no acute distress Skin:  Warm and dry Neck:  Midline trachea, normal thyroid, good ROM, no lymphadenopathy Lungs; Clear to auscultation bilaterally Breast:  No dominant palpable mass, retraction, or nipple discharge Cardiovascular: Regular rate and rhythm Abdomen:  Soft, non tender, no hepatosplenomegaly Pelvic:  External genitalia is normal in appearance, no lesions.  The vagina is normal in appearance. Urethra has no lesions or masses. The cervix is smooth, pap with GC/CHL performed.  Uterus is felt to be normal size, shape, and contour.  No adnexal masses or tenderness noted.Bladder is non tender, no masses felt. Extremities/musculoskeletal:  No swelling or varicosities noted, no clubbing or cyanosis Psych:  No mood changes, alert and cooperative,seems happy PHQ 2 score 0.  Impression: 1. Encounter for gynecological examination with Papanicolaou smear of cervix   2. Patient desires pregnancy   3. History  of DVT (deep vein thrombosis)   4.      Dysmenorrhea     Plan: Start back on PNV Have regular sex, do ovulation detector kit Physical in  1 year Pap in 3 if normal

## 2017-06-03 LAB — CYTOLOGY - PAP
CHLAMYDIA, DNA PROBE: NEGATIVE
DIAGNOSIS: NEGATIVE
Neisseria Gonorrhea: NEGATIVE

## 2017-07-17 ENCOUNTER — Telehealth: Payer: Self-pay | Admitting: *Deleted

## 2017-07-18 NOTE — Telephone Encounter (Signed)
Informed patient that per Victorino DikeJennifer, she needed an appointment to discuss Clomid if that was what she wanted to do. Patient stated her copay has gone up with us to $100 and she can't afford that. Informed patient that in order for the medication to be prescribed, she needed to be counseled on what the medication is, how and when to take it. Patient stated she would call back to make appointment.

## 2017-07-31 ENCOUNTER — Encounter: Payer: Self-pay | Admitting: Adult Health

## 2017-07-31 ENCOUNTER — Ambulatory Visit (INDEPENDENT_AMBULATORY_CARE_PROVIDER_SITE_OTHER): Payer: 59 | Admitting: Adult Health

## 2017-07-31 ENCOUNTER — Encounter (INDEPENDENT_AMBULATORY_CARE_PROVIDER_SITE_OTHER): Payer: Self-pay

## 2017-07-31 VITALS — BP 128/70 | HR 81 | Resp 14 | Ht 67.0 in | Wt 205.0 lb

## 2017-07-31 DIAGNOSIS — Z319 Encounter for procreative management, unspecified: Secondary | ICD-10-CM

## 2017-07-31 DIAGNOSIS — Z3169 Encounter for other general counseling and advice on procreation: Secondary | ICD-10-CM

## 2017-07-31 DIAGNOSIS — Z86718 Personal history of other venous thrombosis and embolism: Secondary | ICD-10-CM

## 2017-07-31 NOTE — Progress Notes (Signed)
Subjective:     Patient ID: Vista Deck, female   DOB: 1995/09/18, 21 y.o.   MRN: [id number]  HPI Analleli is a 21 year old white female, married in to discuss getting pregnant.   Review of Systems Patient denies any headaches, hearing loss, fatigue, blurred vision, shortness of breath, chest pain, abdominal pain, problems with bowel movements, urination, or intercourse. No joint pain or mood swings.Has had fever for 2 weeks with negative work up.  Reviewed past medical,surgical, social and family history. Reviewed medications and allergies.     Objective:   Physical Exam BP 128/70 (BP Location: Left Arm, Patient Position: Sitting, Cuff Size: Normal)   Pulse 81   Resp 14   Ht '5\' 7"'  (1.702 m)   Wt 205 lb (93 kg)   LMP 07/25/2017 (Exact Date)   SpO2 99%   BMI 32.11 kg/m PHQ 2 score 0.Talk only, she has done ovulation kit and has not ovulated, had CT 32/67 and had follicular cyst left ovary, other wise normal scan.will check progesterone level 1/2 if not ovulating, will consider clomid.      Assessment:     1. Patient desires pregnancy   2. History of DVT (deep vein thrombosis)       Plan:       Take PNV has at home Check progesterone level 08/14/17 Have sex every other day day 7-24  F/U prn Review handout on clomid

## 2017-07-31 NOTE — Patient Instructions (Signed)
Clomiphene tablets What is this medicine? CLOMIPHENE (KLOE mi feen) is a fertility drug that increases the chance of pregnancy. It helps women ovulate (produce a mature egg) during their cycle. This medicine may be used for other purposes; ask your health care provider or pharmacist if you have questions. COMMON BRAND NAME(S): Clomid, Serophene What should I tell my health care provider before I take this medicine? They need to know if you have any of these conditions: -adrenal gland disease -blood vessel disease or blood clots -cyst on the ovary -endometriosis -liver disease -ovarian cancer -pituitary gland disease -vaginal bleeding that has not been evaluated -an unusual or allergic reaction to clomiphene, other medicines, foods, dyes, or preservatives -pregnant (should not be used if you are already pregnant) -breast-feeding How should I use this medicine? Take this medicine by mouth with a glass of water. Follow the directions on the prescription label. Take exactly as directed for the exact number of days prescribed. Take your doses at regular intervals. Most women take this medicine for a 5 day period, but the length of treatment may be adjusted. Your doctor will give you a start date for this medication and will give you instructions on proper use. Do not take your medicine more often than directed. Talk to your pediatrician regarding the use of this medicine in children. Special care may be needed. Overdosage: If you think you have taken too much of this medicine contact a poison control center or emergency room at once. NOTE: This medicine is only for you. Do not share this medicine with others. What if I miss a dose? If you miss a dose, take it as soon as you can. If it is almost time for your next dose, take only that dose. Do not take double or extra doses. What may interact with this medicine? -herbal or dietary supplements, like blue cohosh, black cohosh, chasteberry, or  DHEA -prasterone This list may not describe all possible interactions. Give your health care provider a list of all the medicines, herbs, non-prescription drugs, or dietary supplements you use. Also tell them if you smoke, drink alcohol, or use illegal drugs. Some items may interact with your medicine. What should I watch for while using this medicine? Make sure you understand how and when to use this medicine. You need to know when you are ovulating and when to have sexual intercourse. This will increase the chance of a pregnancy. Visit your doctor or health care professional for regular checks on your progress. You may need tests to check the hormone levels in your blood or you may have to use home-urine tests to check for ovulation. Try to keep any appointments. Compared to other fertility treatments, this medicine does not greatly increase your chances of having multiple babies. An increased chance of having twins may occur in roughly 5 out of every 100 women who take this medication. Stop taking this medicine at once and contact your doctor or health care professional if you think you are pregnant. This medicine is not for long-term use. Most women that benefit from this medicine do so within the first three cycles (months). Your doctor or health care professional will monitor your condition. This medicine is usually used for a total of 6 cycles of treatment. You may get drowsy or dizzy. Do not drive, use machinery, or do anything that needs mental alertness until you know how this drug affects you. Do not stand or sit up quickly. This reduces the risk of dizzy or   fainting spells. Drinking alcoholic beverages or smoking tobacco may decrease your chance of becoming pregnant. Limit or stop alcohol and tobacco use during your fertility treatments. What side effects may I notice from receiving this medicine? Side effects that you should report to your doctor or health care professional as soon as  possible: -allergic reactions like skin rash, itching or hives, swelling of the face, lips, or tongue -breathing problems -changes in vision -fluid retention -nausea, vomiting -pelvic pain or bloating -severe abdominal pain -sudden weight gain Side effects that usually do not require medical attention (report to your doctor or health care professional if they continue or are bothersome): -breast discomfort -hot flashes -mild pelvic discomfort -mild nausea This list may not describe all possible side effects. Call your doctor for medical advice about side effects. You may report side effects to FDA at 1-800-FDA-1088. Where should I keep my medicine? Keep out of the reach of children. Store at room temperature between 15 and 30 degrees C (59 and 86 degrees F). Protect from heat, light, and moisture. Throw away any unused medicine after the expiration date. NOTE: This sheet is a summary. It may not cover all possible information. If you have questions about this medicine, talk to your doctor, pharmacist, or health care provider.  2018 Elsevier/Gold Standard (2007-11-10 22:21:06)  

## 2017-08-19 ENCOUNTER — Telehealth: Payer: Self-pay | Admitting: Obstetrics & Gynecology

## 2017-08-19 NOTE — Telephone Encounter (Signed)
Informed patient I did get the lab result. Will send to Cyril MourningJennifer Griffin for review.

## 2017-08-21 ENCOUNTER — Telehealth: Payer: Self-pay | Admitting: Adult Health

## 2017-08-21 MED ORDER — CLOMIPHENE CITRATE 50 MG PO TABS: ORAL_TABLET | ORAL | 2 refills | Status: DC

## 2017-08-21 NOTE — Telephone Encounter (Signed)
Left message that progesterone level <0.1 and will begin clomid 50 mg #5 days 3-7 of cycle, for 3 rounds check progesterone level day 21 of cycle to see if ovulating and if not can increase to 100 mg, have sex every other day, day 7-24 of cycle, and pee before sex and lay there after sex

## 2017-09-09 ENCOUNTER — Telehealth: Payer: Self-pay | Admitting: Adult Health

## 2017-09-09 NOTE — Telephone Encounter (Signed)
Pt reports that she is experiencing hot flashes and feeling like she needs to urinate again after finishing voiding. She wanted to know if this could be related to the Clomid she just started.

## 2017-09-09 NOTE — Telephone Encounter (Signed)
Could have some hot flashes but with negative urine at work, not to worry

## 2017-09-30 ENCOUNTER — Telehealth: Payer: Self-pay | Admitting: Obstetrics & Gynecology

## 2017-09-30 NOTE — Telephone Encounter (Signed)
Clomid had refills, start clomid to day

## 2017-09-30 NOTE — Telephone Encounter (Signed)
Spoke with pt. Pt needs refill on Clomid. Started spotting on Friday but actual flow on Saturday. When should she start Clomid? Thanks!! JSY

## 2017-10-02 ENCOUNTER — Telehealth: Payer: Self-pay | Admitting: Adult Health

## 2017-10-02 NOTE — Telephone Encounter (Signed)
Period stopped, is on clomid, check progesterone level day 21, and have sex every other day 7-24 of cycle

## 2017-10-02 NOTE — Telephone Encounter (Signed)
Patient called stating that she is returning Jennifer's phone call

## 2017-10-02 NOTE — Telephone Encounter (Signed)
Left message I called 

## 2017-11-30 ENCOUNTER — Other Ambulatory Visit: Payer: Self-pay | Admitting: Adult Health

## 2017-12-02 ENCOUNTER — Telehealth: Payer: Self-pay | Admitting: Adult Health

## 2017-12-02 MED ORDER — CLOMIPHENE CITRATE 50 MG PO TABS: ORAL_TABLET | ORAL | 2 refills | Status: DC

## 2017-12-02 NOTE — Telephone Encounter (Signed)
Left message that rx'd clomid 100 mg days 3-7 of cycle, check progesterone day 21 of cycle

## 2017-12-02 NOTE — Telephone Encounter (Signed)
[facility] mg, please contact pt

## 2017-12-05 ENCOUNTER — Telehealth: Payer: Self-pay | Admitting: Adult Health

## 2017-12-05 NOTE — Telephone Encounter (Signed)
Take clomid for 5 days, and have sex every other day 7-24 of cycle, can check progesterone day 21 of cycle, or wait til next month

## 2018-01-10 ENCOUNTER — Encounter: Payer: Self-pay | Admitting: Women's Health

## 2018-01-10 ENCOUNTER — Ambulatory Visit: Payer: 59 | Admitting: Women's Health

## 2018-01-10 VITALS — BP 120/80 | HR 97 | Ht 67.0 in | Wt 199.0 lb

## 2018-01-10 DIAGNOSIS — Z8759 Personal history of other complications of pregnancy, childbirth and the puerperium: Secondary | ICD-10-CM

## 2018-01-10 DIAGNOSIS — Z3491 Encounter for supervision of normal pregnancy, unspecified, first trimester: Secondary | ICD-10-CM

## 2018-01-10 DIAGNOSIS — Z331 Pregnant state, incidental: Secondary | ICD-10-CM

## 2018-01-10 DIAGNOSIS — Z3A01 Less than 8 weeks gestation of pregnancy: Secondary | ICD-10-CM

## 2018-01-10 DIAGNOSIS — Z86718 Personal history of other venous thrombosis and embolism: Secondary | ICD-10-CM

## 2018-01-10 MED ORDER — ENOXAPARIN SODIUM 80 MG/0.8ML ~~LOC~~ SOLN: 80.0000 mg | SUBCUTANEOUS | 11 refills | Status: DC

## 2018-01-10 NOTE — Patient Instructions (Signed)
Stephanie Johns, I greatly value your feedback.  If you receive a survey following your visit with [place] today, we appreciate you taking the time to fill it out.  Thanks, [name], CNM, WHNP-BC   Nausea & Vomiting  Have saltine crackers or pretzels by your bed and eat a few bites before you raise your head out of bed in the morning  Eat small frequent meals throughout the day instead of large meals  Drink plenty of fluids throughout the day to stay hydrated, just don't drink a lot of fluids with your meals.  This can make your stomach fill up faster making you feel sick  Do not brush your teeth right after you eat  Products with real ginger are good for nausea, like ginger ale and ginger hard candy Make sure it says made with real ginger!  Sucking on sour candy like lemon heads is also good for nausea  If your prenatal vitamins make you nauseated, take them at night so you will sleep through the nausea  Sea Bands  If you feel like you need medicine for the nausea & vomiting please let us know  If you are unable to keep any fluids or food down please let us know   Constipation  Drink plenty of fluid, preferably water, throughout the day  Eat foods high in fiber such as fruits, vegetables, and grains  Exercise, such as walking, is a good way to keep your bowels regular  Drink warm fluids, especially warm prune juice, or decaf coffee  Eat a 1/2 cup of real oatmeal (not instant), 1/2 cup applesauce, and 1/2-1 cup warm prune juice every day  If needed, you may take Colace (docusate sodium) stool softener once or twice a day to help keep the stool soft. If you are pregnant, wait until you are out of your first trimester (12-14 weeks of pregnancy)  If you still are having problems with constipation, you may take Miralax once daily as needed to help keep your bowels regular.  If you are pregnant, wait until you are out of your first trimester (12-14 weeks of pregnancy)   First  Trimester of Pregnancy The first trimester of pregnancy is from week 1 until the end of week 12 (months 1 through 3). A week after a sperm fertilizes an egg, the egg will implant on the wall of the uterus. This embryo will begin to develop into a baby. Genes from you and your partner are forming the baby. The female genes determine whether the baby is a boy or a girl. At 6-8 weeks, the eyes and face are formed, and the heartbeat can be seen on ultrasound. At the end of 12 weeks, all the baby's organs are formed.  Now that you are pregnant, you will want to do everything you can to have a healthy baby. Two of the most important things are to get good prenatal care and to follow your health care provider's instructions. Prenatal care is all the medical care you receive before the baby's birth. This care will help prevent, find, and treat any problems during the pregnancy and childbirth. BODY CHANGES Your body goes through many changes during pregnancy. The changes vary from woman to woman.   You may gain or lose a couple of pounds at first.  You may feel sick to your stomach (nauseous) and throw up (vomit). If the vomiting is uncontrollable, call your health care provider.  You may tire easily.  You may develop headaches  that can be relieved by medicines approved by your health care provider.  You may urinate more often. Painful urination may mean you have a bladder infection.  You may develop heartburn as a result of your pregnancy.  You may develop constipation because certain hormones are causing the muscles that push waste through your intestines to slow down.  You may develop hemorrhoids or swollen, bulging veins (varicose veins).  Your breasts may begin to grow larger and become tender. Your nipples may stick out more, and the tissue that surrounds them (areola) may become darker.  Your gums may bleed and may be sensitive to brushing and flossing.  Dark spots or blotches (chloasma, mask  of pregnancy) may develop on your face. This will likely fade after the baby is born.  Your menstrual periods will stop.  You may have a loss of appetite.  You may develop cravings for certain kinds of food.  You may have changes in your emotions from day to day, such as being excited to be pregnant or being concerned that something may go wrong with the pregnancy and baby.  You may have more vivid and strange dreams.  You may have changes in your hair. These can include thickening of your hair, rapid growth, and changes in texture. Some women also have hair loss during or after pregnancy, or hair that feels dry or thin. Your hair will most likely return to normal after your baby is born. WHAT TO EXPECT AT YOUR PRENATAL VISITS During a routine prenatal visit:  You will be weighed to make sure you and the baby are growing normally.  Your blood pressure will be taken.  Your abdomen will be measured to track your baby's growth.  The fetal heartbeat will be listened to starting around week 10 or 12 of your pregnancy.  Test results from any previous visits will be discussed. Your health care provider may ask you:  How you are feeling.  If you are feeling the baby move.  If you have had any abnormal symptoms, such as leaking fluid, bleeding, severe headaches, or abdominal cramping.  If you have any questions. Other tests that may be performed during your first trimester include:  Blood tests to find your blood type and to check for the presence of any previous infections. They will also be used to check for low iron levels (anemia) and Rh antibodies. Later in the pregnancy, blood tests for diabetes will be done along with other tests if problems develop.  Urine tests to check for infections, diabetes, or protein in the urine.  An ultrasound to confirm the proper growth and development of the baby.  An amniocentesis to check for possible genetic problems.  Fetal screens for spina  bifida and Down syndrome.  You may need other tests to make sure you and the baby are doing well. HOME CARE INSTRUCTIONS  Medicines  Follow your health care provider's instructions regarding medicine use. Specific medicines may be either safe or unsafe to take during pregnancy.  Take your prenatal vitamins as directed.  If you develop constipation, try taking a stool softener if your health care provider approves. Diet  Eat regular, well-balanced meals. Choose a variety of foods, such as meat or vegetable-based protein, fish, milk and low-fat dairy products, vegetables, fruits, and whole grain breads and cereals. Your health care provider will help you determine the amount of weight gain that is right for you.  Avoid raw meat and uncooked cheese. These carry germs that can  cause birth defects in the baby.  Eating four or five small meals rather than three large meals a day may help relieve nausea and vomiting. If you start to feel nauseous, eating a few soda crackers can be helpful. Drinking liquids between meals instead of during meals also seems to help nausea and vomiting.  If you develop constipation, eat more high-fiber foods, such as fresh vegetables or fruit and whole grains. Drink enough fluids to keep your urine clear or pale yellow. Activity and Exercise  Exercise only as directed by your health care provider. Exercising will help you:  Control your weight.  Stay in shape.  Be prepared for labor and delivery.  Experiencing pain or cramping in the lower abdomen or low back is a good sign that you should stop exercising. Check with your health care provider before continuing normal exercises.  Try to avoid standing for long periods of time. Move your legs often if you must stand in one place for a long time.  Avoid heavy lifting.  Wear low-heeled shoes, and practice good posture.  You may continue to have sex unless your health care provider directs you  otherwise. Relief of Pain or Discomfort  Wear a good support bra for breast tenderness.   Take warm sitz baths to soothe any pain or discomfort caused by hemorrhoids. Use hemorrhoid cream if your health care provider approves.   Rest with your legs elevated if you have leg cramps or low back pain.  If you develop varicose veins in your legs, wear support hose. Elevate your feet for 15 minutes, 3-4 times a day. Limit salt in your diet. Prenatal Care  Schedule your prenatal visits by the twelfth week of pregnancy. They are usually scheduled monthly at first, then more often in the last 2 months before delivery.  Write down your questions. Take them to your prenatal visits.  Keep all your prenatal visits as directed by your health care provider. Safety  Wear your seat belt at all times when driving.  Make a list of emergency phone numbers, including numbers for family, friends, the hospital, and police and fire departments. General Tips  Ask your health care provider for a referral to a local prenatal education class. Begin classes no later than at the beginning of month 6 of your pregnancy.  Ask for help if you have counseling or nutritional needs during pregnancy. Your health care provider can offer advice or refer you to specialists for help with various needs.  Do not use hot tubs, steam rooms, or saunas.  Do not douche or use tampons or scented sanitary pads.  Do not cross your legs for long periods of time.  Avoid cat litter boxes and soil used by cats. These carry germs that can cause birth defects in the baby and possibly loss of the fetus by miscarriage or stillbirth.  Avoid all smoking, herbs, alcohol, and medicines not prescribed by your health care provider. Chemicals in these affect the formation and growth of the baby.  Schedule a dentist appointment. At home, brush your teeth with a soft toothbrush and be gentle when you floss. SEEK MEDICAL CARE IF:   You have  dizziness.  You have mild pelvic cramps, pelvic pressure, or nagging pain in the abdominal area.  You have persistent nausea, vomiting, or diarrhea.  You have a bad smelling vaginal discharge.  You have pain with urination.  You notice increased swelling in your face, hands, legs, or ankles. SEEK IMMEDIATE MEDICAL CARE IF:  You have a fever.  You are leaking fluid from your vagina.  You have spotting or bleeding from your vagina.  You have severe abdominal cramping or pain.  You have rapid weight gain or loss.  You vomit blood or material that looks like coffee grounds.  You are exposed to [place] measles and have never had them.  You are exposed to fifth disease or chickenpox.  You develop a severe headache.  You have shortness of breath.  You have any kind of trauma, such as from a fall or a car accident. Document Released: 07/24/2001 Document Revised: 12/14/2013 Document Reviewed: 06/09/2013 [facility] Patient Information 2015 [place], [place]. This information is not intended to replace advice given to you by your health care provider. Make sure you discuss any questions you have with your health care provider.

## 2018-01-10 NOTE — Progress Notes (Signed)
   GYN VISIT Patient name: Stephanie Johns Guthrie MRN [id number]  Date of birth: [birth year] Chief Complaint:   Possible Pregnancy (serum hcg done)  History of Present Illness:   Stephanie Johns Einspahr is a 22 y.o. [id number] Caucasian female at 6921w6d by certain LMP of 4/20 being seen today for +BHCG done 5/28 @ [facility] : 4,586. Had been on clomid, just increased dosage to 100mg  and conceived! Taking pnv. Some Johns/v, breast tenderness, mild intermittent cramping. No vb. H/O SAB. H/O DVT, neg workup. Was on xarelto then switched to Lovenox w/ her previous pregnancy- and right after had SAB, concerned it could have been from Lovenox.   Patient's last menstrual period was 11/30/2017. Review of Systems:   Pertinent items are noted in HPI Denies fever/chills, dizziness, headaches, visual disturbances, fatigue, shortness of breath, chest pain, abdominal pain, vomiting, abnormal vaginal discharge/itching/odor/irritation, problems with periods, bowel movements, urination, or intercourse unless otherwise stated above.  Pertinent History Reviewed:  Reviewed past medical,surgical, social, obstetrical and family history.  Reviewed problem list, medications and allergies. Physical Assessment:   Vitals:   01/10/18 1322  BP: 120/80  Pulse: 97  Weight: 199 lb (90.3 kg)  Height: 5\' 7"  (1.702 m)  Body mass index is 31.17 kg/m.       Physical Examination:   General appearance: alert, well appearing, and in no distress  Mental status: alert, oriented to person, place, and time  Skin: warm & dry   Cardiovascular: normal heart rate noted  Respiratory: normal respiratory effort, no distress  Abdomen: soft, non-tender   Pelvic: examination not indicated  Extremities: no edema   No results found for this or any previous visit (from the past 24 hour(s)).  Assessment & Plan:  1) 8221w6d pregnant> by certain LMP, continue pnv, will get dating u/s in about 2wks. Will check progesterone d/t h/o SAB.   2) H/O DVT>  discussed w/ [facility], rx Lovenox 80mg  daily. Reassured Lovenox was not reason for prior SAB.   Meds:  Meds ordered this encounter  Medications  . enoxaparin (LOVENOX) 80 MG/0.8ML injection    Sig: Inject 0.8 mLs (80 mg total) into the skin daily.    Dispense:  30 Syringe    Refill:  11    Order Specific Question:   Supervising Provider    Answer:   Duane LopeEURE, LUTHER H [[id number]]    Orders Placed This Encounter  Procedures  . US OB Comp Less 14 Wks  . Progesterone    Return in about 2 weeks (around 01/24/2018) for dating u/s.  Cheral MarkerKimberly R Lizzeth Meder CNM, [facility]NP-BC 01/10/2018 2:12 PM

## 2018-01-11 LAB — PROGESTERONE: PROGESTERONE: 18.2 ng/mL

## 2018-01-13 ENCOUNTER — Encounter: Payer: Self-pay | Admitting: Women's Health

## 2018-01-21 ENCOUNTER — Encounter: Payer: Self-pay | Admitting: Women's Health

## 2018-01-30 ENCOUNTER — Other Ambulatory Visit: Payer: Self-pay | Admitting: Women's Health

## 2018-01-30 DIAGNOSIS — O3680X0 Pregnancy with inconclusive fetal viability, not applicable or unspecified: Secondary | ICD-10-CM

## 2018-01-30 DIAGNOSIS — Z3491 Encounter for supervision of normal pregnancy, unspecified, first trimester: Secondary | ICD-10-CM

## 2018-01-31 ENCOUNTER — Ambulatory Visit (INDEPENDENT_AMBULATORY_CARE_PROVIDER_SITE_OTHER): Payer: 59

## 2018-01-31 DIAGNOSIS — Z3491 Encounter for supervision of normal pregnancy, unspecified, first trimester: Secondary | ICD-10-CM

## 2018-01-31 DIAGNOSIS — O3680X0 Pregnancy with inconclusive fetal viability, not applicable or unspecified: Secondary | ICD-10-CM | POA: Diagnosis not present

## 2018-01-31 DIAGNOSIS — Z3A08 8 weeks gestation of pregnancy: Secondary | ICD-10-CM | POA: Diagnosis not present

## 2018-01-31 NOTE — Progress Notes (Signed)
US 8+6 wks,single IUP w/ys,positive fht 167 bpm,crl 20.10 mm,normal right ovary,left ovary not visualized

## 2018-02-07 ENCOUNTER — Other Ambulatory Visit: Payer: 59

## 2018-02-12 ENCOUNTER — Encounter: Payer: Self-pay | Admitting: Women's Health

## 2018-02-12 ENCOUNTER — Other Ambulatory Visit: Payer: Self-pay | Admitting: Advanced Practice Midwife

## 2018-02-12 ENCOUNTER — Encounter (INDEPENDENT_AMBULATORY_CARE_PROVIDER_SITE_OTHER): Payer: Self-pay

## 2018-02-12 MED ORDER — PROMETHAZINE HCL 12.5 MG PO TABS: 12.5000 mg | ORAL_TABLET | Freq: Four times a day (QID) | ORAL | 0 refills | Status: DC | PRN

## 2018-02-12 MED ORDER — DOXYLAMINE-PYRIDOXINE ER 20-20 MG PO TBCR: 1.0000 | EXTENDED_RELEASE_TABLET | Freq: Two times a day (BID) | ORAL | 6 refills | Status: DC

## 2018-02-12 NOTE — Progress Notes (Signed)
Bonjesta/phenergan for nausea

## 2018-02-19 ENCOUNTER — Ambulatory Visit: Payer: 59 | Admitting: *Deleted

## 2018-02-19 ENCOUNTER — Ambulatory Visit (INDEPENDENT_AMBULATORY_CARE_PROVIDER_SITE_OTHER): Payer: 59 | Admitting: Women's Health

## 2018-02-19 ENCOUNTER — Encounter: Payer: Self-pay | Admitting: Women's Health

## 2018-02-19 VITALS — BP 116/74 | HR 92 | Wt 199.0 lb

## 2018-02-19 DIAGNOSIS — Z3A11 11 weeks gestation of pregnancy: Secondary | ICD-10-CM

## 2018-02-19 DIAGNOSIS — Z3481 Encounter for supervision of other normal pregnancy, first trimester: Secondary | ICD-10-CM

## 2018-02-19 DIAGNOSIS — Z331 Pregnant state, incidental: Secondary | ICD-10-CM

## 2018-02-19 DIAGNOSIS — Z1389 Encounter for screening for other disorder: Secondary | ICD-10-CM

## 2018-02-19 DIAGNOSIS — Z86718 Personal history of other venous thrombosis and embolism: Secondary | ICD-10-CM

## 2018-02-19 DIAGNOSIS — Z3682 Encounter for antenatal screening for nuchal translucency: Secondary | ICD-10-CM

## 2018-02-19 DIAGNOSIS — Z3490 Encounter for supervision of normal pregnancy, unspecified, unspecified trimester: Secondary | ICD-10-CM | POA: Insufficient documentation

## 2018-02-19 LAB — POCT URINALYSIS DIPSTICK
Blood, UA: NEGATIVE
GLUCOSE UA: NEGATIVE
Ketones, UA: NEGATIVE
LEUKOCYTES UA: NEGATIVE
Nitrite, UA: NEGATIVE
Protein, UA: POSITIVE — AB

## 2018-02-19 MED ORDER — DOXYLAMINE-PYRIDOXINE ER 20-20 MG PO TBCR: 1.0000 | EXTENDED_RELEASE_TABLET | Freq: Every day | ORAL | 8 refills | Status: DC

## 2018-02-19 NOTE — Progress Notes (Signed)
INITIAL OBSTETRICAL VISIT Patient name: Stephanie MadeiraKristen N Eimer MRN [id number]  Date of birth: 1996/04/15 Chief Complaint:   Initial Prenatal Visit (nausea)  History of Present Illness:   Stephanie Johns is a 22 y.o. 772P0010 Caucasian female at 303w4d by LMP c/w 8wk u/s, with an Estimated Date of Delivery: 09/06/18 being seen today for her initial obstetrical visit.   Her obstetrical history is significant for early SAB x 1.   Today she reports nausea- [facility] was [facility] her pharmacy/not [facility]. Patient's last menstrual period was 11/30/2017 (exact date). Last pap 05/30/17. Results were: normal Review of Systems:   Pertinent items are noted in HPI Denies cramping/contractions, leakage of fluid, vaginal bleeding, abnormal vaginal discharge w/ itching/odor/irritation, headaches, visual changes, shortness of breath, chest pain, abdominal pain, severe nausea/vomiting, or problems with urination or bowel movements unless otherwise stated above.  Pertinent History Reviewed:  Reviewed past medical,surgical, social, obstetrical and family history.  Reviewed problem list, medications and allergies. OB History  Gravida Para Term Preterm AB Living  2       1    SAB TAB Ectopic Multiple Live Births  1            # Outcome Date GA Lbr Len/2nd Weight Sex Delivery Anes PTL Lv  2 Current           1 SAB            Physical Assessment:   Vitals:   02/19/18 1456  BP: 116/74  Pulse: 92  Weight: 199 lb (90.3 kg)  Body mass index is 31.17 kg/m.       Physical Examination:  General appearance - well appearing, and in no distress  Mental status - alert, oriented to person, place, and time  Psych:  She has a normal mood and affect  Skin - warm and dry, normal color, no suspicious lesions noted  Chest - effort normal, all lung fields clear to auscultation bilaterally  Heart - normal rate and regular rhythm  Abdomen - soft, nontender  Extremities:  No swelling or varicosities  noted  Thin prep pap is not done  Fetal Heart Rate (bpm): +u/s via informal transabdominal u/s  Results for orders placed or performed in visit on 02/19/18 (from the past 24 hour(s))  POCT urinalysis dipstick   Collection Time: 02/19/18  3:15 PM  Result Value Ref Range   Color, UA     Clarity, UA     Glucose, UA Negative Negative   Bilirubin, UA     Ketones, UA neg    Spec Grav, UA  1.010 - 1.025   Blood, UA neg    pH, UA  5.0 - 8.0   Protein, UA Positive (A) Negative   Urobilinogen, UA  0.2 or 1.0 E.U./dL   Nitrite, UA neg    Leukocytes, UA Negative Negative   Appearance     Odor      Assessment & Plan:  1) Low-Risk Pregnancy G2P0010 at 543w4d with an Estimated Date of Delivery: 09/06/18   2) Initial OB visit  3) H/O unprovoked DVT w/ neg work-up> continue Lovenox 80mg  daily  4) Nausea> rx Bonjesta to [facility], gave 3 samples  Meds:  Meds ordered this encounter  Medications  . Doxylamine-Pyridoxine ER (BONJESTA) 20-20 MG TBCR    Sig: Take 1 tablet by mouth at bedtime. Can add 1 tablet in the morning if needed for nausea and vomiting    Dispense:  60 tablet  Refill:  8    Order Specific Question:   Supervising Provider    Answer:   Lazaro Arms [[id number]]    Initial labs obtained Continue prenatal vitamins Reviewed n/v relief measures and warning s/s to report Reviewed recommended weight gain based on pre-gravid BMI Encouraged well-balanced diet Genetic Screening discussed Integrated Screen: requested Cystic fibrosis screening discussed declined Ultrasound discussed; fetal survey: requested CCNC completed>PCM not here, [place]  Follow-up: Return in about 2 weeks (around 03/05/2018) for LROB, US:NT+1stIT.   Orders Placed This Encounter  Procedures  . Urine Culture  . GC/Chlamydia Probe Amp  . US Fetal Nuchal Translucency Measurement  . Obstetric Panel, Including HIV  . Urinalysis, Routine w reflex microscopic  . Pain Management Screening Profile (10S)  .  POCT urinalysis dipstick    Cheral Marker CNM, [facility] 02/19/2018 3:39 PM

## 2018-02-19 NOTE — Patient Instructions (Signed)
Stephanie MadeiraKristen N Esh, I greatly value your feedback.  If you receive a survey following your visit with Stephanie Johns today, we appreciate you taking the time to fill it out.  Thanks, Joellyn HaffKim Shamus Desantis, CNM, WHNP-BC   Nausea & Vomiting  Have saltine crackers or pretzels by your bed and eat a few bites before you raise your head out of bed in the morning  Eat small frequent meals throughout the day instead of large meals  Drink plenty of fluids throughout the day to stay hydrated, just don't drink a lot of fluids with your meals.  This can make your stomach fill up faster making you feel sick  Do not brush your teeth right after you eat  Products with real ginger are good for nausea, like ginger ale and ginger hard candy Make sure it says made with real ginger!  Sucking on sour candy like lemon heads is also good for nausea  If your prenatal vitamins make you nauseated, take them at night so you will sleep through the nausea  Sea Bands  If you feel like you need medicine for the nausea & vomiting please let Stephanie Johns know  If you are unable to keep any fluids or food down please let Stephanie Johns know   Constipation  Drink plenty of fluid, preferably water, throughout the day  Eat foods high in fiber such as fruits, vegetables, and grains  Exercise, such as walking, is a good way to keep your bowels regular  Drink warm fluids, especially warm prune juice, or decaf coffee  Eat a 1/2 cup of real oatmeal (not instant), 1/2 cup applesauce, and 1/2-1 cup warm prune juice every day  If needed, you may take Colace (docusate sodium) stool softener once or twice a day to help keep the stool soft. If you are pregnant, wait until you are out of your first trimester (12-14 weeks of pregnancy)  If you still are having problems with constipation, you may take Miralax once daily as needed to help keep your bowels regular.  If you are pregnant, wait until you are out of your first trimester (12-14 weeks of pregnancy)   First  Trimester of Pregnancy The first trimester of pregnancy is from week 1 until the end of week 12 (months 1 through 3). A week after a sperm fertilizes an egg, the egg will implant on the wall of the uterus. This embryo will begin to develop into a baby. Genes from you and your partner are forming the baby. The female genes determine whether the baby is a boy or a girl. At 6-8 weeks, the eyes and face are formed, and the heartbeat can be seen on ultrasound. At the end of 12 weeks, all the baby's organs are formed.  Now that you are pregnant, you will want to do everything you can to have a healthy baby. Two of the most important things are to get good prenatal care and to follow your health care provider's instructions. Prenatal care is all the medical care you receive before the baby's birth. This care will help prevent, find, and treat any problems during the pregnancy and childbirth. BODY CHANGES Your body goes through many changes during pregnancy. The changes vary from woman to woman.   You may gain or lose a couple of pounds at first.  You may feel sick to your stomach (nauseous) and throw up (vomit). If the vomiting is uncontrollable, call your health care provider.  You may tire easily.  You may develop headaches  that can be relieved by medicines approved by your health care provider.  You may urinate more often. Painful urination may mean you have a bladder infection.  You may develop heartburn as a result of your pregnancy.  You may develop constipation because certain hormones are causing the muscles that push waste through your intestines to slow down.  You may develop hemorrhoids or swollen, bulging veins (varicose veins).  Your breasts may begin to grow larger and become tender. Your nipples may stick out more, and the tissue that surrounds them (areola) may become darker.  Your gums may bleed and may be sensitive to brushing and flossing.  Dark spots or blotches (chloasma, mask  of pregnancy) may develop on your face. This will likely fade after the baby is born.  Your menstrual periods will stop.  You may have a loss of appetite.  You may develop cravings for certain kinds of food.  You may have changes in your emotions from day to day, such as being excited to be pregnant or being concerned that something may go wrong with the pregnancy and baby.  You may have more vivid and strange dreams.  You may have changes in your hair. These can include thickening of your hair, rapid growth, and changes in texture. Some women also have hair loss during or after pregnancy, or hair that feels dry or thin. Your hair will most likely return to normal after your baby is born. WHAT TO EXPECT AT YOUR PRENATAL VISITS During a routine prenatal visit:  You will be weighed to make sure you and the baby are growing normally.  Your blood pressure will be taken.  Your abdomen will be measured to track your baby's growth.  The fetal heartbeat will be listened to starting around week 10 or 12 of your pregnancy.  Test results from any previous visits will be discussed. Your health care provider may ask you:  How you are feeling.  If you are feeling the baby move.  If you have had any abnormal symptoms, such as leaking fluid, bleeding, severe headaches, or abdominal cramping.  If you have any questions. Other tests that may be performed during your first trimester include:  Blood tests to find your blood type and to check for the presence of any previous infections. They will also be used to check for low iron levels (anemia) and Rh antibodies. Later in the pregnancy, blood tests for diabetes will be done along with other tests if problems develop.  Urine tests to check for infections, diabetes, or protein in the urine.  An ultrasound to confirm the proper growth and development of the baby.  An amniocentesis to check for possible genetic problems.  Fetal screens for spina  bifida and Down syndrome.  You may need other tests to make sure you and the baby are doing well. HOME CARE INSTRUCTIONS  Medicines  Follow your health care provider's instructions regarding medicine use. Specific medicines may be either safe or unsafe to take during pregnancy.  Take your prenatal vitamins as directed.  If you develop constipation, try taking a stool softener if your health care provider approves. Diet  Eat regular, well-balanced meals. Choose a variety of foods, such as meat or vegetable-based protein, fish, milk and low-fat dairy products, vegetables, fruits, and whole grain breads and cereals. Your health care provider will help you determine the amount of weight gain that is right for you.  Avoid raw meat and uncooked cheese. These carry germs that can  cause birth defects in the baby.  Eating four or five small meals rather than three large meals a day may help relieve nausea and vomiting. If you start to feel nauseous, eating a few soda crackers can be helpful. Drinking liquids between meals instead of during meals also seems to help nausea and vomiting.  If you develop constipation, eat more high-fiber foods, such as fresh vegetables or fruit and whole grains. Drink enough fluids to keep your urine clear or pale yellow. Activity and Exercise  Exercise only as directed by your health care provider. Exercising will help you:  Control your weight.  Stay in shape.  Be prepared for labor and delivery.  Experiencing pain or cramping in the lower abdomen or low back is a good sign that you should stop exercising. Check with your health care provider before continuing normal exercises.  Try to avoid standing for long periods of time. Move your legs often if you must stand in one place for a long time.  Avoid heavy lifting.  Wear low-heeled shoes, and practice good posture.  You may continue to have sex unless your health care provider directs you  otherwise. Relief of Pain or Discomfort  Wear a good support bra for breast tenderness.   Take warm sitz baths to soothe any pain or discomfort caused by hemorrhoids. Use hemorrhoid cream if your health care provider approves.   Rest with your legs elevated if you have leg cramps or low back pain.  If you develop varicose veins in your legs, wear support hose. Elevate your feet for 15 minutes, 3-4 times a day. Limit salt in your diet. Prenatal Care  Schedule your prenatal visits by the twelfth week of pregnancy. They are usually scheduled monthly at first, then more often in the last 2 months before delivery.  Write down your questions. Take them to your prenatal visits.  Keep all your prenatal visits as directed by your health care provider. Safety  Wear your seat belt at all times when driving.  Make a list of emergency phone numbers, including numbers for family, friends, the hospital, and police and fire departments. General Tips  Ask your health care provider for a referral to a local prenatal education class. Begin classes no later than at the beginning of month 6 of your pregnancy.  Ask for help if you have counseling or nutritional needs during pregnancy. Your health care provider can offer advice or refer you to specialists for help with various needs.  Do not use hot tubs, steam rooms, or saunas.  Do not douche or use tampons or scented sanitary pads.  Do not cross your legs for long periods of time.  Avoid cat litter boxes and soil used by cats. These carry germs that can cause birth defects in the baby and possibly loss of the fetus by miscarriage or stillbirth.  Avoid all smoking, herbs, alcohol, and medicines not prescribed by your health care provider. Chemicals in these affect the formation and growth of the baby.  Schedule a dentist appointment. At home, brush your teeth with a soft toothbrush and be gentle when you floss. SEEK MEDICAL CARE IF:   You have  dizziness.  You have mild pelvic cramps, pelvic pressure, or nagging pain in the abdominal area.  You have persistent nausea, vomiting, or diarrhea.  You have a bad smelling vaginal discharge.  You have pain with urination.  You notice increased swelling in your face, hands, legs, or ankles. SEEK IMMEDIATE MEDICAL CARE IF:  You have a fever.  You are leaking fluid from your vagina.  You have spotting or bleeding from your vagina.  You have severe abdominal cramping or pain.  You have rapid weight gain or loss.  You vomit blood or material that looks like coffee grounds.  You are exposed to [place] measles and have never had them.  You are exposed to fifth disease or chickenpox.  You develop a severe headache.  You have shortness of breath.  You have any kind of trauma, such as from a fall or a car accident. Document Released: 07/24/2001 Document Revised: 12/14/2013 Document Reviewed: 06/09/2013 [facility] Patient Information 2015 [place], [place]. This information is not intended to replace advice given to you by your health care provider. Make sure you discuss any questions you have with your health care provider.

## 2018-02-20 LAB — OBSTETRIC PANEL, INCLUDING HIV
Antibody Screen: NEGATIVE
BASOS ABS: 0 10*3/uL (ref 0.0–0.2)
Basos: 0 %
EOS (ABSOLUTE): 0 10*3/uL (ref 0.0–0.4)
Eos: 0 %
HEP B S AG: NEGATIVE
HIV Screen 4th Generation wRfx: NONREACTIVE
Hematocrit: 37.1 % (ref 34.0–46.6)
Hemoglobin: 12.3 g/dL (ref 11.1–15.9)
IMMATURE GRANULOCYTES: 1 %
Immature Grans (Abs): 0.1 10*3/uL (ref 0.0–0.1)
Lymphocytes Absolute: 1.8 10*3/uL (ref 0.7–3.1)
Lymphs: 15 %
MCH: 30.3 pg (ref 26.6–33.0)
MCHC: 33.2 g/dL (ref 31.5–35.7)
MCV: 91 fL (ref 79–97)
MONOCYTES: 7 %
Monocytes Absolute: 0.9 10*3/uL (ref 0.1–0.9)
NEUTROS PCT: 77 %
Neutrophils Absolute: 9.3 10*3/uL — ABNORMAL HIGH (ref 1.4–7.0)
Platelets: 284 10*3/uL (ref 150–450)
RBC: 4.06 x10E6/uL (ref 3.77–5.28)
RDW: 14.1 % (ref 12.3–15.4)
RPR: NONREACTIVE
Rh Factor: POSITIVE
Rubella Antibodies, IGG: 4.45 index (ref >0.99)
WBC: 12.1 10*3/uL — AB (ref 3.4–10.8)

## 2018-02-20 LAB — GC/CHLAMYDIA PROBE AMP
CHLAMYDIA, DNA PROBE: NEGATIVE
NEISSERIA GONORRHOEAE BY PCR: NEGATIVE

## 2018-02-20 LAB — URINALYSIS, ROUTINE W REFLEX MICROSCOPIC
BILIRUBIN UA: NEGATIVE
GLUCOSE, UA: NEGATIVE
Leukocytes, UA: NEGATIVE
Nitrite, UA: NEGATIVE
RBC UA: NEGATIVE
UUROB: 0.2 mg/dL (ref 0.2–1.0)
pH, UA: 5.5 (ref 5.0–7.5)

## 2018-02-20 LAB — PMP SCREEN PROFILE (10S), URINE
AMPHETAMINE SCREEN URINE: NEGATIVE ng/mL
BARBITURATE SCREEN URINE: NEGATIVE ng/mL
BENZODIAZEPINE SCREEN, URINE: NEGATIVE ng/mL
CANNABINOIDS UR QL SCN: NEGATIVE ng/mL
COCAINE(METAB.)SCREEN, URINE: NEGATIVE ng/mL
CREATININE(CRT), U: 291.3 mg/dL (ref 20.0–300.0)
METHADONE SCREEN, URINE: NEGATIVE ng/mL
OPIATE SCREEN URINE: NEGATIVE ng/mL
OXYCODONE+OXYMORPHONE UR QL SCN: NEGATIVE ng/mL
PROPOXYPHENE SCREEN URINE: NEGATIVE ng/mL
Ph of Urine: 6.1 (ref 4.5–8.9)
Phencyclidine Qn, Ur: NEGATIVE ng/mL

## 2018-02-22 LAB — URINE CULTURE

## 2018-02-24 ENCOUNTER — Other Ambulatory Visit: Payer: Self-pay | Admitting: Women's Health

## 2018-02-24 DIAGNOSIS — O9989 Other specified diseases and conditions complicating pregnancy, childbirth and the puerperium: Principal | ICD-10-CM

## 2018-02-24 DIAGNOSIS — R8271 Bacteriuria: Secondary | ICD-10-CM | POA: Insufficient documentation

## 2018-02-24 DIAGNOSIS — O99891 Other specified diseases and conditions complicating pregnancy: Secondary | ICD-10-CM

## 2018-02-24 MED ORDER — NITROFURANTOIN MONOHYD MACRO 100 MG PO CAPS: 100.0000 mg | ORAL_CAPSULE | Freq: Two times a day (BID) | ORAL | 0 refills | Status: DC

## 2018-02-25 ENCOUNTER — Telehealth: Payer: Self-pay | Admitting: Advanced Practice Midwife

## 2018-02-25 NOTE — Telephone Encounter (Signed)
Patient called stating that she has been feeling sick and has snot been able to hold anything down, pt would like to speak with a nurse because she does not know if she is feeling like that from her being pregnant or got food poisoning. Please contact pt

## 2018-02-25 NOTE — Telephone Encounter (Signed)
Patient states she has had some nausea since eating some chicken that she was unsure if it was cooked completely. Others in her office experienced diarrhea.  Has not had any vomiting today and did take some nausea medication.  Informed patient that if it was viral or food poisoning it would need to run its course.  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  Pt verbalized understanding with no further questions.

## 2018-02-27 ENCOUNTER — Encounter: Payer: Self-pay | Admitting: Women's Health

## 2018-03-05 ENCOUNTER — Encounter: Payer: Self-pay | Admitting: Advanced Practice Midwife

## 2018-03-05 ENCOUNTER — Ambulatory Visit (INDEPENDENT_AMBULATORY_CARE_PROVIDER_SITE_OTHER): Payer: 59 | Admitting: Advanced Practice Midwife

## 2018-03-05 ENCOUNTER — Ambulatory Visit (INDEPENDENT_AMBULATORY_CARE_PROVIDER_SITE_OTHER): Payer: 59

## 2018-03-05 VITALS — Wt 194.0 lb

## 2018-03-05 DIAGNOSIS — Z1389 Encounter for screening for other disorder: Secondary | ICD-10-CM

## 2018-03-05 DIAGNOSIS — Z3481 Encounter for supervision of other normal pregnancy, first trimester: Secondary | ICD-10-CM

## 2018-03-05 DIAGNOSIS — Z331 Pregnant state, incidental: Secondary | ICD-10-CM

## 2018-03-05 DIAGNOSIS — Z363 Encounter for antenatal screening for malformations: Secondary | ICD-10-CM

## 2018-03-05 DIAGNOSIS — Z3682 Encounter for antenatal screening for nuchal translucency: Secondary | ICD-10-CM | POA: Diagnosis not present

## 2018-03-05 DIAGNOSIS — Z3A13 13 weeks gestation of pregnancy: Secondary | ICD-10-CM | POA: Diagnosis not present

## 2018-03-05 LAB — POCT URINALYSIS DIPSTICK OB
Blood, UA: NEGATIVE
Glucose, UA: NEGATIVE — AB
Leukocytes, UA: NEGATIVE
Nitrite, UA: NEGATIVE
POC,PROTEIN,UA: NEGATIVE

## 2018-03-05 NOTE — Progress Notes (Signed)
  G2P0010 6962w4d Estimated Date of Delivery: 09/06/18  Weight 194 lb (88 kg), last menstrual period 11/30/2017.   BP weight and urine results all reviewed and noted.  Please refer to the obstetrical flow sheet for the fundal height and fetal heart rate documentation:  Patient denies any bleeding and no rupture of membranes symptoms or regular contractions. Patient is without complaints other than continued nauses (ran out of Bonjesta--3 more samples given; declined zofran). All questions were answered.  Unable to obtain NT d/t fetal position. Will check insurance for CfDNA coverage--will let me know if I need to order it. Otherwise, will get AFP at next visit   Physical Assessment:   Vitals:   03/05/18 1216  Weight: 194 lb (88 kg)  Body mass index is 30.38 kg/m.        Physical Examination:   General appearance: Well appearing, and in no distress  Mental status: Alert, oriented to person, place, and time  Skin: Warm & dry  Cardiovascular: Normal heart rate noted  Respiratory: Normal respiratory effort, no distress  Abdomen: Soft, gravid, nontender  Pelvic: Cervical exam deferred         Extremities: Edema: None  Fetal Status:     Movement: Absent    Results for orders placed or performed in visit on 03/05/18 (from the past 24 hour(s))  POC Urinalysis Dipstick OB   Collection Time: 03/05/18 12:18 PM  Result Value Ref Range   Color, UA     Clarity, UA     Glucose, UA Negative (A) (none)   Bilirubin, UA     Ketones, UA large    Spec Grav, UA  1.010 - 1.025   Blood, UA neg    pH, UA  5.0 - 8.0   POC Protein UA Negative Negative, Trace   Urobilinogen, UA  0.2 or 1.0 E.U./dL   Nitrite, UA neg    Leukocytes, UA Negative Negative   Appearance     Odor     US 13+4 wks,measurements c/w dates,crl 75.00 mm,normal ovaries bilat,anterior pl gr 0,unable to obtain NT because of fetal position  Orders Placed This Encounter  Procedures  . US OB Comp + 14 Wk  . POC Urinalysis  Dipstick OB    Plan:  Continued routine obstetrical care,   Return in about 1 month (around 04/07/2018) for YQ:MVHQIONS:Anatomy, LROB.

## 2018-03-05 NOTE — Progress Notes (Signed)
US 13+4 wks,measurements c/w dates,crl 75.00 mm,normal ovaries bilat,anterior pl gr 0,unable to obtain NT because of fetal position

## 2018-03-14 ENCOUNTER — Telehealth: Payer: Self-pay | Admitting: *Deleted

## 2018-03-14 ENCOUNTER — Encounter: Payer: Self-pay | Admitting: Obstetrics and Gynecology

## 2018-03-14 ENCOUNTER — Ambulatory Visit (INDEPENDENT_AMBULATORY_CARE_PROVIDER_SITE_OTHER): Payer: 59 | Admitting: Obstetrics and Gynecology

## 2018-03-14 VITALS — BP 117/71 | HR 82 | Wt 199.4 lb

## 2018-03-14 DIAGNOSIS — Z331 Pregnant state, incidental: Secondary | ICD-10-CM

## 2018-03-14 DIAGNOSIS — Z3481 Encounter for supervision of other normal pregnancy, first trimester: Secondary | ICD-10-CM

## 2018-03-14 DIAGNOSIS — Z3A14 14 weeks gestation of pregnancy: Secondary | ICD-10-CM

## 2018-03-14 DIAGNOSIS — Z1389 Encounter for screening for other disorder: Secondary | ICD-10-CM

## 2018-03-14 DIAGNOSIS — O26852 Spotting complicating pregnancy, second trimester: Secondary | ICD-10-CM

## 2018-03-14 LAB — POCT URINALYSIS DIPSTICK OB
Blood, UA: NEGATIVE
GLUCOSE, UA: NEGATIVE — AB
KETONES UA: NEGATIVE
Leukocytes, UA: NEGATIVE
NITRITE UA: NEGATIVE
PROTEIN: NEGATIVE

## 2018-03-14 NOTE — Telephone Encounter (Signed)
Patient called concerned stated she is having some bleeding this afternoon along with some back pain.  She has a miscarriage in the past and is having the same symptoms.  Will w/i with JVF this morning.

## 2018-03-14 NOTE — Progress Notes (Addendum)
Patient ID: Stephanie Johns, female   DOB: 1996/06/14, 22 y.o.   MRN: [id number]    [facility] Clinic Visit  @DATE @            Patient name: Stephanie Johns MRN [id number]  Date of birth: May 11, 1996  CC & HPI:  Stephanie Johns is a 22 y.o. female presenting today for light spotting when she wiped this morning. She is currently [redacted]w[redacted]d LROB. She notes no physical or sexual activity before the bleeding began. Pt had a miscarriage 2 years ago and is worried that it may be happening again. The patient denies fever, chills or any other symptoms or complaints at this time.   ROS:  ROS + spotting - fever - chills All systems are negative except as noted in the HPI and PMH.   Pertinent History Reviewed:   Reviewed:  Medical         Past Medical History:  Diagnosis Date  . Anxiety   . High cholesterol   . History of DVT of lower extremity   . Vitamin D deficiency                               Surgical Hx:    Past Surgical History:  Procedure Laterality Date  . NO PAST SURGERIES     Medications: Reviewed & Updated - see associated section                       Current Outpatient Medications:  .  Doxylamine-Pyridoxine ER (BONJESTA) 20-20 MG TBCR, Take 1 tablet by mouth at bedtime. Can add 1 tablet in the morning if needed for nausea and vomiting, Disp: 60 tablet, Rfl: 8 .  enoxaparin (LOVENOX) 80 MG/0.8ML injection, Inject 0.8 mLs (80 mg total) into the skin daily., Disp: 30 Syringe, Rfl: 11 .  Prenatal Vit-Fe Fumarate-FA (PREPLUS) 27-1 MG TABS, TAKE 1 TABLET BY MOUTH EVERY DAY, Disp: 30 tablet, Rfl: 12 .  Cholecalciferol (VITAMIN D3) 3000 units TABS, Take by mouth., Disp: , Rfl:  .  promethazine (PHENERGAN) 12.5 MG tablet, Take 1 tablet (12.5 mg total) by mouth every 6 (six) hours as needed for nausea or vomiting. (Patient not taking: Reported on 03/14/2018), Disp: 30 tablet, Rfl: 0   Social History: Reviewed -  reports that she has never smoked. She has never used smokeless  tobacco.  Objective Findings:  Vitals: Blood pressure 117/71, pulse 82, weight 199 lb 6.4 oz (90.4 kg), last menstrual period 11/30/2017.  PHYSICAL EXAMINATION General appearance - alert, well appearing, and in no distress and oriented to person, place, and time Mental status - alert, oriented to person, place, and time, normal mood, behavior, speech, dress, motor activity, and thought processes, affect appropriate to mood  PELVIC Vagina - small amount of pink color in  discharge Cervix - closed, long Bedside u/s shows normal fetal hr, + FM, normal cervix length, no funnelling, anterior placenta normal af volume.  Assessment & Plan:   A:  1. [redacted]w[redacted]d LROB 2. 1st trimester spotting, no visible abnormalities  P:  1. [place] at bedside done today 2. F/U as sched for LROB on 04/11/2018  By signing my name below, I, Pietro Cassis, attest that this documentation has been prepared under the direction and in the presence of Tilda Burrow, MD. Electronically Signed: Pietro Cassis, Medical Scribe. 03/14/18. 11:05 AM.  I personally performed the services described in this  documentation, which was SCRIBED in my presence. The recorded information has been reviewed and considered accurate. It has been edited as necessary during review. Tilda BurrowJohn V Missi Mcmackin, MD

## 2018-03-17 ENCOUNTER — Encounter: Payer: Self-pay | Admitting: Women's Health

## 2018-03-17 ENCOUNTER — Telehealth: Payer: Self-pay | Admitting: Obstetrics and Gynecology

## 2018-03-17 MED ORDER — SULFAMETHOXAZOLE-TRIMETHOPRIM 800-160 MG PO TABS: 1.0000 | ORAL_TABLET | Freq: Two times a day (BID) | ORAL | 0 refills | Status: DC

## 2018-03-17 NOTE — Telephone Encounter (Signed)
Pt called stating that she has another UTI. She c/o burning with urination. 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  Pt requests a different medication than macrobid be sent it this time. She states last time she vomited when taking it. Advised that I would send her request to a provider and she could check with her pharmacy later today. Pt verbalized understanding.

## 2018-03-17 NOTE — Telephone Encounter (Signed)
Patient called stating that she has developed another UTI. Pt states that this is her second one since she found out she was pregnant. Pt is 15 weeks, Pt states that she would like something called into the [facility] in [place]. Pt states please do not send the last medication that was sent for her UTI because that one made her sick. Pt would also like to know if there is something she can do to prevent UTI's. Please contact pt

## 2018-03-18 ENCOUNTER — Other Ambulatory Visit: Payer: Self-pay | Admitting: Women's Health

## 2018-03-18 MED ORDER — NITROFURANTOIN MONOHYD MACRO 100 MG PO CAPS
100.0000 mg | ORAL_CAPSULE | Freq: Two times a day (BID) | ORAL | 0 refills | Status: DC
Start: 2018-03-18 — End: 2018-04-11

## 2018-03-19 ENCOUNTER — Encounter (INDEPENDENT_AMBULATORY_CARE_PROVIDER_SITE_OTHER): Payer: Self-pay

## 2018-04-11 ENCOUNTER — Encounter: Payer: Self-pay | Admitting: Women's Health

## 2018-04-11 ENCOUNTER — Encounter: Payer: 59 | Admitting: Obstetrics and Gynecology

## 2018-04-11 ENCOUNTER — Ambulatory Visit (INDEPENDENT_AMBULATORY_CARE_PROVIDER_SITE_OTHER): Payer: 59 | Admitting: Women's Health

## 2018-04-11 ENCOUNTER — Ambulatory Visit (INDEPENDENT_AMBULATORY_CARE_PROVIDER_SITE_OTHER): Payer: 59

## 2018-04-11 VITALS — BP 111/75 | HR 77 | Wt 201.0 lb

## 2018-04-11 DIAGNOSIS — Z363 Encounter for antenatal screening for malformations: Secondary | ICD-10-CM | POA: Diagnosis not present

## 2018-04-11 DIAGNOSIS — Z3482 Encounter for supervision of other normal pregnancy, second trimester: Secondary | ICD-10-CM

## 2018-04-11 DIAGNOSIS — Z1389 Encounter for screening for other disorder: Secondary | ICD-10-CM

## 2018-04-11 DIAGNOSIS — Z331 Pregnant state, incidental: Secondary | ICD-10-CM

## 2018-04-11 DIAGNOSIS — Z3402 Encounter for supervision of normal first pregnancy, second trimester: Secondary | ICD-10-CM

## 2018-04-11 DIAGNOSIS — Z3A18 18 weeks gestation of pregnancy: Secondary | ICD-10-CM

## 2018-04-11 DIAGNOSIS — Z86718 Personal history of other venous thrombosis and embolism: Secondary | ICD-10-CM

## 2018-04-11 LAB — POCT URINALYSIS DIPSTICK OB
GLUCOSE, UA: NEGATIVE
Ketones, UA: NEGATIVE
LEUKOCYTES UA: NEGATIVE
Nitrite, UA: NEGATIVE
POC,PROTEIN,UA: NEGATIVE
RBC UA: NEGATIVE

## 2018-04-11 NOTE — Progress Notes (Signed)
   LOW-RISK PREGNANCY VISIT Patient name: Stephanie Johns Husby MRN [id number]  Date of birth: 1996-08-06 Chief Complaint:   Routine Prenatal Visit (US today; AFP today)  History of Present Illness:   Stephanie Johns [place] is a 22 y.o. 502P0010 female at 4145w6d with an Estimated Date of Delivery: 09/06/18 being seen today for ongoing management of a low-risk pregnancy.  Today she reports h/o DVT, on Lovenox 80mg  daily.  . Vag. Bleeding: None.  Movement: Present. denies leaking of fluid. Review of Systems:   Pertinent items are noted in HPI Denies abnormal vaginal discharge w/ itching/odor/irritation, headaches, visual changes, shortness of breath, chest pain, abdominal pain, severe nausea/vomiting, or problems with urination or bowel movements unless otherwise stated above. Pertinent History Reviewed:  Reviewed past medical,surgical, social, obstetrical and family history.  Reviewed problem list, medications and allergies. Physical Assessment:   Vitals:   04/11/18 1127  BP: 111/75  Pulse: 77  Weight: 201 lb (91.2 kg)  Body mass index is 31.48 kg/m.        Physical Examination:   General appearance: Well appearing, and in no distress  Mental status: Alert, oriented to person, place, and time  Skin: Warm & dry  Cardiovascular: Normal heart rate noted  Respiratory: Normal respiratory effort, no distress  Abdomen: Soft, gravid, nontender  Pelvic: Cervical exam deferred         Extremities: Edema: None  Fetal Status: Fetal Heart Rate (bpm): 144 u/s   Movement: Present    US 18+6 wks,cephalic, cx 3 cm,anterior fundal placenta gr 0,bilat adnexa's wnl,svp of fluid 4.8 cm,fhr 144 bpm,two LVEICF 1.6 and 1.9 mm,bilat pyelectasis LK 4.9 mm,RK 4.9 mm,EFW 266 g 51%,anatomy complete  Results for orders placed or performed in visit on 04/11/18 (from the past 24 hour(s))  POC Urinalysis Dipstick OB   Collection Time: 04/11/18 11:28 AM  Result Value Ref Range   Color, UA     Clarity, UA     Glucose, UA  Negative Negative   Bilirubin, UA     Ketones, UA neg    Spec Grav, UA     Blood, UA neg    pH, UA     POC Protein UA Negative Negative, Trace   Urobilinogen, UA     Nitrite, UA neg    Leukocytes, UA Negative Negative   Appearance     Odor      Assessment & Plan:  1) Low-risk pregnancy G2P0010 at 1645w6d with an Estimated Date of Delivery: 09/06/18   2) H/O DVT, continue Lovenox 80mg  daily  3) Fetal Lt EICF x 2, bilateral mild pyelectasis> doing AFP today, discussed and gave printed info   Meds: No orders of the defined types were placed in this encounter.  Labs/procedures today: anatomy u/s, AFP  Plan:  Continue routine obstetrical care   Reviewed: Preterm labor symptoms and general obstetric precautions including but not limited to vaginal bleeding, contractions, leaking of fluid and fetal movement were reviewed in detail with the patient.  All questions were answered  Follow-up: Return in about 4 weeks (around 05/09/2018) for LROB.  Orders Placed This Encounter  Procedures  . AFP TETRA  . POC Urinalysis Dipstick OB   Cheral MarkerKimberly R Booker CNM, [facility]NP[facility] 04/11/2018 3:03 PM

## 2018-04-11 NOTE — Progress Notes (Signed)
US 18+6 wks,cephalic, cx 3 cm,anterior fundal placenta gr 0,bilat adnexa's wnl,svp of fluid 4.8 cm,fhr 144 bpm,two LVEICF 1.6 and 1.9 mm,bilat pyelectasis LK 4.9 mm,RK 4.9 mm,EFW 266 g 51%,anatomy complete

## 2018-04-11 NOTE — Patient Instructions (Signed)
Stephanie MadeiraKristen N Johns, I greatly value your feedback.  If you receive a survey following your visit with us today, we appreciate you taking the time to fill it out.  Thanks, Joellyn HaffKim Armani Gawlik, CNM, WHNP-BC   Second Trimester of Pregnancy The second trimester is from week 14 through week 27 (months 4 through 6). The second trimester is often a time when you feel your best. Your body has adjusted to being pregnant, and you begin to feel better physically. Usually, morning sickness has lessened or quit completely, you may have more energy, and you may have an increase in appetite. The second trimester is also a time when the fetus is growing rapidly. At the end of the sixth month, the fetus is about 9 inches long and weighs about 1 pounds. You will likely begin to feel the baby move (quickening) between 16 and 20 weeks of pregnancy. Body changes during your second trimester Your body continues to go through many changes during your second trimester. The changes vary from woman to woman.  Your weight will continue to increase. You will notice your lower abdomen bulging out.  You may begin to get stretch marks on your hips, abdomen, and breasts.  You may develop headaches that can be relieved by medicines. The medicines should be approved by your health care provider.  You may urinate more often because the fetus is pressing on your bladder.  You may develop or continue to have heartburn as a result of your pregnancy.  You may develop constipation because certain hormones are causing the muscles that push waste through your intestines to slow down.  You may develop hemorrhoids or swollen, bulging veins (varicose veins).  You may have back pain. This is caused by: ? Weight gain. ? Pregnancy hormones that are relaxing the joints in your pelvis. ? A shift in weight and the muscles that support your balance.  Your breasts will continue to grow and they will continue to become tender.  Your gums may bleed  and may be sensitive to brushing and flossing.  Dark spots or blotches (chloasma, mask of pregnancy) may develop on your face. This will likely fade after the baby is born.  A dark line from your belly button to the pubic area (linea nigra) may appear. This will likely fade after the baby is born.  You may have changes in your hair. These can include thickening of your hair, rapid growth, and changes in texture. Some women also have hair loss during or after pregnancy, or hair that feels dry or thin. Your hair will most likely return to normal after your baby is born.  What to expect at prenatal visits During a routine prenatal visit:  You will be weighed to make sure you and the fetus are growing normally.  Your blood pressure will be taken.  Your abdomen will be measured to track your baby's growth.  The fetal heartbeat will be listened to.  Any test results from the previous visit will be discussed.  Your health care provider may ask you:  How you are feeling.  If you are feeling the baby move.  If you have had any abnormal symptoms, such as leaking fluid, bleeding, severe headaches, or abdominal cramping.  If you are using any tobacco products, including cigarettes, chewing tobacco, and electronic cigarettes.  If you have any questions.  Other tests that may be performed during your second trimester include:  Blood tests that check for: ? Low iron levels (anemia). ? High  blood sugar that affects pregnant women (gestational diabetes) between 3 and 28 weeks. ? Rh antibodies. This is to check for a protein on red blood cells (Rh factor).  Urine tests to check for infections, diabetes, or protein in the urine.  An ultrasound to confirm the proper growth and development of the baby.  An amniocentesis to check for possible genetic problems.  Fetal screens for spina bifida and Down syndrome.  HIV (human immunodeficiency virus) testing. Routine prenatal testing includes  screening for HIV, unless you choose not to have this test.  Follow these instructions at home: Medicines  Follow your health care provider's instructions regarding medicine use. Specific medicines may be either safe or unsafe to take during pregnancy.  Take a prenatal vitamin that contains at least 600 micrograms (mcg) of folic acid.  If you develop constipation, try taking a stool softener if your health care provider approves. Eating and drinking  Eat a balanced diet that includes fresh fruits and vegetables, whole grains, good sources of protein such as meat, eggs, or tofu, and low-fat dairy. Your health care provider will help you determine the amount of weight gain that is right for you.  Avoid raw meat and uncooked cheese. These carry germs that can cause birth defects in the baby.  If you have low calcium intake from food, talk to your health care provider about whether you should take a daily calcium supplement.  Limit foods that are high in fat and processed sugars, such as fried and sweet foods.  To prevent constipation: ? Drink enough fluid to keep your urine clear or pale yellow. ? Eat foods that are high in fiber, such as fresh fruits and vegetables, whole grains, and beans. Activity  Exercise only as directed by your health care provider. Most women can continue their usual exercise routine during pregnancy. Try to exercise for 30 minutes at least 5 days a week. Stop exercising if you experience uterine contractions.  Avoid heavy lifting, wear low heel shoes, and practice good posture.  A sexual relationship may be continued unless your health care provider directs you otherwise. Relieving pain and discomfort  Wear a good support bra to prevent discomfort from breast tenderness.  Take warm sitz baths to soothe any pain or discomfort caused by hemorrhoids. Use hemorrhoid cream if your health care provider approves.  Rest with your legs elevated if you have leg cramps  or low back pain.  If you develop varicose veins, wear support hose. Elevate your feet for 15 minutes, 3-4 times a day. Limit salt in your diet. Prenatal Care  Write down your questions. Take them to your prenatal visits.  Keep all your prenatal visits as told by your health care provider. This is important. Safety  Wear your seat belt at all times when driving.  Make a list of emergency phone numbers, including numbers for family, friends, the hospital, and police and fire departments. General instructions  Ask your health care provider for a referral to a local prenatal education class. Begin classes no later than the beginning of month 6 of your pregnancy.  Ask for help if you have counseling or nutritional needs during pregnancy. Your health care provider can offer advice or refer you to specialists for help with various needs.  Do not use hot tubs, steam rooms, or saunas.  Do not douche or use tampons or scented sanitary pads.  Do not cross your legs for long periods of time.  Avoid cat litter boxes and soil  used by cats. These carry germs that can cause birth defects in the baby and possibly loss of the fetus by miscarriage or stillbirth.  Avoid all smoking, herbs, alcohol, and unprescribed drugs. Chemicals in these products can affect the formation and growth of the baby.  Do not use any products that contain nicotine or tobacco, such as cigarettes and e-cigarettes. If you need help quitting, ask your health care provider.  Visit your dentist if you have not gone yet during your pregnancy. Use a soft toothbrush to brush your teeth and be gentle when you floss. Contact a health care provider if:  You have dizziness.  You have mild pelvic cramps, pelvic pressure, or nagging pain in the abdominal area.  You have persistent nausea, vomiting, or diarrhea.  You have a bad smelling vaginal discharge.  You have pain when you urinate. Get help right away if:  You have a  fever.  You are leaking fluid from your vagina.  You have spotting or bleeding from your vagina.  You have severe abdominal cramping or pain.  You have rapid weight gain or weight loss.  You have shortness of breath with chest pain.  You notice sudden or extreme swelling of your face, hands, ankles, feet, or legs.  You have not felt your baby move in over an hour.  You have severe headaches that do not go away when you take medicine.  You have vision changes. Summary  The second trimester is from week 14 through week 27 (months 4 through 6). It is also a time when the fetus is growing rapidly.  Your body goes through many changes during pregnancy. The changes vary from woman to woman.  Avoid all smoking, herbs, alcohol, and unprescribed drugs. These chemicals affect the formation and growth your baby.  Do not use any tobacco products, such as cigarettes, chewing tobacco, and e-cigarettes. If you need help quitting, ask your health care provider.  Contact your health care provider if you have any questions. Keep all prenatal visits as told by your health care provider. This is important. This information is not intended to replace advice given to you by your health care provider. Make sure you discuss any questions you have with your health care provider. Document Released: 07/24/2001 Document Revised: 01/05/2016 Document Reviewed: 09/30/2012 Elsevier Interactive Patient Education  2017 [employer].

## 2018-04-13 LAB — AFP TETRA
DIA Mom Value: 0.77
DIA VALUE (EIA): 115.12 pg/mL
DSR (BY AGE) 1 IN: 1121
DSR (SECOND TRIMESTER) 1 IN: 10000
Gestational Age: 18.6 WEEKS
MSAFP MOM: 0.78
MSAFP: 29.8 ng/mL
MSHCG MOM: 1.36
MSHCG: 30488 m[IU]/mL
Maternal Age At EDD: 22.3 yr
Osb Risk: 10000
Test Results:: NEGATIVE
WEIGHT: 201 [lb_av]
uE3 Mom: 1.37
uE3 Value: 1.83 ng/mL

## 2018-04-28 ENCOUNTER — Other Ambulatory Visit: Payer: Self-pay | Admitting: Women's Health

## 2018-04-28 DIAGNOSIS — Z3482 Encounter for supervision of other normal pregnancy, second trimester: Secondary | ICD-10-CM

## 2018-04-28 DIAGNOSIS — Z113 Encounter for screening for infections with a predominantly sexual mode of transmission: Secondary | ICD-10-CM

## 2018-04-30 ENCOUNTER — Telehealth: Payer: Self-pay | Admitting: *Deleted

## 2018-04-30 NOTE — Telephone Encounter (Signed)
LMOVM diagnosis code. Advised to call back if that was not the code she needed.

## 2018-05-08 ENCOUNTER — Ambulatory Visit (INDEPENDENT_AMBULATORY_CARE_PROVIDER_SITE_OTHER): Payer: 59 | Admitting: Women's Health

## 2018-05-08 ENCOUNTER — Encounter: Payer: Self-pay | Admitting: Women's Health

## 2018-05-08 VITALS — BP 108/63 | HR 97 | Wt 204.5 lb

## 2018-05-08 DIAGNOSIS — Z86718 Personal history of other venous thrombosis and embolism: Secondary | ICD-10-CM

## 2018-05-08 DIAGNOSIS — Z1389 Encounter for screening for other disorder: Secondary | ICD-10-CM

## 2018-05-08 DIAGNOSIS — Z331 Pregnant state, incidental: Secondary | ICD-10-CM

## 2018-05-08 DIAGNOSIS — Z3482 Encounter for supervision of other normal pregnancy, second trimester: Secondary | ICD-10-CM

## 2018-05-08 DIAGNOSIS — Z3A22 22 weeks gestation of pregnancy: Secondary | ICD-10-CM

## 2018-05-08 DIAGNOSIS — O26832 Pregnancy related renal disease, second trimester: Secondary | ICD-10-CM

## 2018-05-08 DIAGNOSIS — O283 Abnormal ultrasonic finding on antenatal screening of mother: Secondary | ICD-10-CM

## 2018-05-08 LAB — POCT URINALYSIS DIPSTICK OB
Glucose, UA: NEGATIVE
Leukocytes, UA: NEGATIVE
NITRITE UA: NEGATIVE
POC,PROTEIN,UA: NEGATIVE

## 2018-05-08 NOTE — Progress Notes (Signed)
   LOW-RISK PREGNANCY VISIT Patient name: Stephanie Johns MRN [id number]  Date of birth: Dec 21, 1995 Chief Complaint:   Routine Prenatal Visit  History of Present Illness:   Stephanie Johns is a 22 y.o. G17P0010 female at [redacted]w[redacted]d with an Estimated Date of Delivery: 09/06/18 being seen today for ongoing management of a low-risk pregnancy. H/O DVT on Lovenox 80mg  daily. Today she reports Lt lower side pain that has resolved. . Contractions: Not present. Vag. Bleeding: None.  Movement: Present. denies leaking of fluid. Review of Systems:   Pertinent items are noted in HPI Denies abnormal vaginal discharge w/ itching/odor/irritation, headaches, visual changes, shortness of breath, chest pain, abdominal pain, severe nausea/vomiting, or problems with urination or bowel movements unless otherwise stated above. Pertinent History Reviewed:  Reviewed past medical,surgical, social, obstetrical and family history.  Reviewed problem list, medications and allergies. Physical Assessment:   Vitals:   05/08/18 1415  BP: 108/63  Pulse: 97  Weight: 204 lb 8 oz (92.8 kg)  Body mass index is 32.03 kg/m.        Physical Examination:   General appearance: Well appearing, and in no distress  Mental status: Alert, oriented to person, place, and time  Skin: Warm & dry  Cardiovascular: Normal heart rate noted  Respiratory: Normal respiratory effort, no distress  Abdomen: Soft, gravid, nontender  Pelvic: Cervical exam deferred         Extremities: Edema: None  Fetal Status: Fetal Heart Rate (bpm): 155 Fundal Height: 22 cm Movement: Present    Results for orders placed or performed in visit on 05/08/18 (from the past 24 hour(s))  POC Urinalysis Dipstick OB   Collection Time: 05/08/18  2:16 PM  Result Value Ref Range   Color, UA     Clarity, UA     Glucose, UA Negative Negative   Bilirubin, UA     Ketones, UA 1+    Spec Grav, UA     Blood, UA trace    pH, UA     POC Protein UA Negative Negative, Trace     Urobilinogen, UA     Nitrite, UA neg    Leukocytes, UA Negative Negative   Appearance     Odor      Assessment & Plan:  1) Low-risk pregnancy G2P0010 at [redacted]w[redacted]d with an Estimated Date of Delivery: 09/06/18   2) H/O DVT, continue Lovenox 80mg  daily  3) Fetal EICF x 2 and bilateral pyelectasis> neg AFP, will repeat u/s @ 32wks to recheck kidneys   Meds: No orders of the defined types were placed in this encounter.  Labs/procedures today: none  Plan:  Continue routine obstetrical care   Reviewed: Preterm labor symptoms and general obstetric precautions including but not limited to vaginal bleeding, contractions, leaking of fluid and fetal movement were reviewed in detail with the patient.  All questions were answered  Follow-up: Return in about 4 weeks (around 06/05/2018) for LROB, PN2.  Orders Placed This Encounter  Procedures  . POC Urinalysis Dipstick OB   Cheral Marker CNM, [facility] 05/08/2018 2:40 PM

## 2018-05-08 NOTE — Patient Instructions (Signed)
Stephanie Johns, I greatly value your feedback.  If you receive a survey following your visit with [place] today, we appreciate you taking the time to fill it out.  Thanks, Stephanie Johns, CNM, WHNP-BC   You will have your sugar test next visit.  Please do not eat or drink anything after midnight the night before you come, not even water.  You will be here for at least two hours.     Call the office [phone]) or go to [facility] if:  You begin to have strong, frequent contractions  Your water breaks.  Sometimes it is a big gush of fluid, sometimes it is just a trickle that keeps getting your panties wet or running down your legs  You have vaginal bleeding.  It is normal to have a small amount of spotting if your cervix was checked.   You don't feel your baby moving like normal.  If you don't, get you something to eat and drink and lay down and focus on feeling your baby move.   If your baby is still not moving like normal, you should call the office or go to [facility].  Second Trimester of Pregnancy The second trimester is from week 13 through week 28, months 4 through 6. The second trimester is often a time when you feel your best. Your body has also adjusted to being pregnant, and you begin to feel better physically. Usually, morning sickness has lessened or quit completely, you may have more energy, and you may have an increase in appetite. The second trimester is also a time when the fetus is growing rapidly. At the end of the sixth month, the fetus is about 9 inches long and weighs about 1 pounds. You will likely begin to feel the baby move (quickening) between 18 and 20 weeks of the pregnancy. BODY CHANGES Your body goes through many changes during pregnancy. The changes vary from woman to woman.   Your weight will continue to increase. You will notice your lower abdomen bulging out.  You may begin to get stretch marks on your hips, abdomen, and breasts.  You may develop  headaches that can be relieved by medicines approved by your health care provider.  You may urinate more often because the fetus is pressing on your bladder.  You may develop or continue to have heartburn as a result of your pregnancy.  You may develop constipation because certain hormones are causing the muscles that push waste through your intestines to slow down.  You may develop hemorrhoids or swollen, bulging veins (varicose veins).  You may have back pain because of the weight gain and pregnancy hormones relaxing your joints between the bones in your pelvis and as a result of a shift in weight and the muscles that support your balance.  Your breasts will continue to grow and be tender.  Your gums may bleed and may be sensitive to brushing and flossing.  Dark spots or blotches (chloasma, mask of pregnancy) may develop on your face. This will likely fade after the baby is born.  A dark line from your belly button to the pubic area (linea nigra) may appear. This will likely fade after the baby is born.  You may have changes in your hair. These can include thickening of your hair, rapid growth, and changes in texture. Some women also have hair loss during or after pregnancy, or hair that feels dry or thin. Your hair will most likely return to normal after your  baby is born. WHAT TO EXPECT AT YOUR PRENATAL VISITS During a routine prenatal visit:  You will be weighed to make sure you and the fetus are growing normally.  Your blood pressure will be taken.  Your abdomen will be measured to track your baby's growth.  The fetal heartbeat will be listened to.  Any test results from the previous visit will be discussed. Your health care provider may ask you:  How you are feeling.  If you are feeling the baby move.  If you have had any abnormal symptoms, such as leaking fluid, bleeding, severe headaches, or abdominal cramping.  If you have any questions. Other tests that may be  performed during your second trimester include:  Blood tests that check for:  Low iron levels (anemia).  Gestational diabetes (between 24 and 28 weeks).  Rh antibodies.  Urine tests to check for infections, diabetes, or protein in the urine.  An ultrasound to confirm the proper growth and development of the baby.  An amniocentesis to check for possible genetic problems.  Fetal screens for spina bifida and Down syndrome. HOME CARE INSTRUCTIONS   Avoid all smoking, herbs, alcohol, and unprescribed drugs. These chemicals affect the formation and growth of the baby.  Follow your health care provider's instructions regarding medicine use. There are medicines that are either safe or unsafe to take during pregnancy.  Exercise only as directed by your health care provider. Experiencing uterine cramps is a good sign to stop exercising.  Continue to eat regular, healthy meals.  Wear a good support bra for breast tenderness.  Do not use hot tubs, steam rooms, or saunas.  Wear your seat belt at all times when driving.  Avoid raw meat, uncooked cheese, cat litter boxes, and soil used by cats. These carry germs that can cause birth defects in the baby.  Take your prenatal vitamins.  Try taking a stool softener (if your health care provider approves) if you develop constipation. Eat more high-fiber foods, such as fresh vegetables or fruit and whole grains. Drink plenty of fluids to keep your urine clear or pale yellow.  Take warm sitz baths to soothe any pain or discomfort caused by hemorrhoids. Use hemorrhoid cream if your health care provider approves.  If you develop varicose veins, wear support hose. Elevate your feet for 15 minutes, 3-4 times a day. Limit salt in your diet.  Avoid heavy lifting, wear low heel shoes, and practice good posture.  Rest with your legs elevated if you have leg cramps or low back pain.  Visit your dentist if you have not gone yet during your pregnancy.  Use a soft toothbrush to brush your teeth and be gentle when you floss.  A sexual relationship may be continued unless your health care provider directs you otherwise.  Continue to go to all your prenatal visits as directed by your health care provider. SEEK MEDICAL CARE IF:   You have dizziness.  You have mild pelvic cramps, pelvic pressure, or nagging pain in the abdominal area.  You have persistent nausea, vomiting, or diarrhea.  You have a bad smelling vaginal discharge.  You have pain with urination. SEEK IMMEDIATE MEDICAL CARE IF:   You have a fever.  You are leaking fluid from your vagina.  You have spotting or bleeding from your vagina.  You have severe abdominal cramping or pain.  You have rapid weight gain or loss.  You have shortness of breath with chest pain.  You notice sudden or extreme   swelling of your face, hands, ankles, feet, or legs.  You have not felt your baby move in over an hour.  You have severe headaches that do not go away with medicine.  You have vision changes. Document Released: 07/24/2001 Document Revised: 08/04/2013 Document Reviewed: 09/30/2012 [facility] Patient Information 2015 [place], [place]. This information is not intended to replace advice given to you by your health care provider. Make sure you discuss any questions you have with your health care provider.

## 2018-06-05 ENCOUNTER — Other Ambulatory Visit: Payer: 59

## 2018-06-06 ENCOUNTER — Encounter: Payer: 59 | Admitting: Women's Health

## 2018-06-06 ENCOUNTER — Other Ambulatory Visit: Payer: 59

## 2018-06-13 ENCOUNTER — Ambulatory Visit (INDEPENDENT_AMBULATORY_CARE_PROVIDER_SITE_OTHER): Payer: BLUE CROSS/BLUE SHIELD | Admitting: Women's Health

## 2018-06-13 ENCOUNTER — Encounter: Payer: Self-pay | Admitting: Women's Health

## 2018-06-13 ENCOUNTER — Other Ambulatory Visit: Payer: Self-pay

## 2018-06-13 ENCOUNTER — Other Ambulatory Visit: Payer: BLUE CROSS/BLUE SHIELD

## 2018-06-13 VITALS — BP 124/83 | HR 90 | Wt 203.0 lb

## 2018-06-13 DIAGNOSIS — Z3482 Encounter for supervision of other normal pregnancy, second trimester: Secondary | ICD-10-CM | POA: Diagnosis not present

## 2018-06-13 DIAGNOSIS — Z23 Encounter for immunization: Secondary | ICD-10-CM | POA: Diagnosis not present

## 2018-06-13 DIAGNOSIS — Z3A27 27 weeks gestation of pregnancy: Secondary | ICD-10-CM

## 2018-06-13 DIAGNOSIS — Z1389 Encounter for screening for other disorder: Secondary | ICD-10-CM

## 2018-06-13 DIAGNOSIS — O358XX0 Maternal care for other (suspected) fetal abnormality and damage, not applicable or unspecified: Secondary | ICD-10-CM

## 2018-06-13 DIAGNOSIS — O35EXX0 Maternal care for other (suspected) fetal abnormality and damage, fetal genitourinary anomalies, not applicable or unspecified: Secondary | ICD-10-CM

## 2018-06-13 DIAGNOSIS — O283 Abnormal ultrasonic finding on antenatal screening of mother: Secondary | ICD-10-CM

## 2018-06-13 DIAGNOSIS — Z131 Encounter for screening for diabetes mellitus: Secondary | ICD-10-CM | POA: Diagnosis not present

## 2018-06-13 DIAGNOSIS — Z331 Pregnant state, incidental: Secondary | ICD-10-CM

## 2018-06-13 LAB — POCT URINALYSIS DIPSTICK OB
Blood, UA: NEGATIVE
Glucose, UA: NEGATIVE
Ketones, UA: NEGATIVE
LEUKOCYTES UA: NEGATIVE
NITRITE UA: NEGATIVE
PROTEIN: NEGATIVE

## 2018-06-13 MED ORDER — ENOXAPARIN SODIUM 80 MG/0.8ML ~~LOC~~ SOLN: 80.0000 mg | SUBCUTANEOUS | 3 refills | Status: DC

## 2018-06-13 NOTE — Patient Instructions (Signed)
Stephanie Johns, I greatly value your feedback.  If you receive a survey following your visit with [place] today, we appreciate you taking the time to fill it out.  Thanks, [name], CNM, WHNP-BC   Call the office [phone]) or go to [facility] if:  You begin to have strong, frequent contractions  Your water breaks.  Sometimes it is a big gush of fluid, sometimes it is just a trickle that keeps getting your panties wet or running down your legs  You have vaginal bleeding.  It is normal to have a small amount of spotting if your cervix was checked.   You don't feel your baby moving like normal.  If you don't, get you something to eat and drink and lay down and focus on feeling your baby move.  You should feel at least 10 movements in 2 hours.  If you don't, you should call the office or go to [facility].    Tdap Vaccine  It is recommended that you get the Tdap vaccine during the third trimester of EACH pregnancy to help protect your baby from getting pertussis (whooping cough)  27-36 weeks is the BEST time to do this so that you can pass the protection on to your baby. During pregnancy is better than after pregnancy, but if you are unable to get it during pregnancy it will be offered at the hospital.   You can get this vaccine with [place], at the health department, your family doctor, or some local pharmacies  Everyone who will be around your baby should also be up-to-date on their vaccines before the baby comes. Adults (who are not pregnant) only need 1 dose of Tdap during adulthood.   Third Trimester of Pregnancy The third trimester is from week 29 through week 42, months 7 through 9. The third trimester is a time when the fetus is growing rapidly. At the end of the ninth month, the fetus is about 20 inches in length and weighs 6-10 pounds.  BODY CHANGES Your body goes through many changes during pregnancy. The changes vary from woman to woman.   Your weight will continue to  increase. You can expect to gain 25-35 pounds (11-16 kg) by the end of the pregnancy.  You may begin to get stretch marks on your hips, abdomen, and breasts.  You may urinate more often because the fetus is moving lower into your pelvis and pressing on your bladder.  You may develop or continue to have heartburn as a result of your pregnancy.  You may develop constipation because certain hormones are causing the muscles that push waste through your intestines to slow down.  You may develop hemorrhoids or swollen, bulging veins (varicose veins).  You may have pelvic pain because of the weight gain and pregnancy hormones relaxing your joints between the bones in your pelvis. Backaches may result from overexertion of the muscles supporting your posture.  You may have changes in your hair. These can include thickening of your hair, rapid growth, and changes in texture. Some women also have hair loss during or after pregnancy, or hair that feels dry or thin. Your hair will most likely return to normal after your baby is born.  Your breasts will continue to grow and be tender. A yellow discharge may leak from your breasts called colostrum.  Your belly button may stick out.  You may feel short of breath because of your expanding uterus.  You may notice the fetus "dropping," or moving lower  in your abdomen.  You may have a bloody mucus discharge. This usually occurs a few days to a week before labor begins.  Your cervix becomes thin and soft (effaced) near your due date. WHAT TO EXPECT AT YOUR PRENATAL EXAMS  You will have prenatal exams every 2 weeks until week 36. Then, you will have weekly prenatal exams. During a routine prenatal visit:  You will be weighed to make sure you and the fetus are growing normally.  Your blood pressure is taken.  Your abdomen will be measured to track your baby's growth.  The fetal heartbeat will be listened to.  Any test results from the previous visit  will be discussed.  You may have a cervical check near your due date to see if you have effaced. At around 36 weeks, your caregiver will check your cervix. At the same time, your caregiver will also perform a test on the secretions of the vaginal tissue. This test is to determine if a type of bacteria, Group B streptococcus, is present. Your caregiver will explain this further. Your caregiver may ask you:  What your birth plan is.  How you are feeling.  If you are feeling the baby move.  If you have had any abnormal symptoms, such as leaking fluid, bleeding, severe headaches, or abdominal cramping.  If you have any questions. Other tests or screenings that may be performed during your third trimester include:  Blood tests that check for low iron levels (anemia).  Fetal testing to check the health, activity level, and growth of the fetus. Testing is done if you have certain medical conditions or if there are problems during the pregnancy. FALSE LABOR You may feel small, irregular contractions that eventually go away. These are called Braxton Hicks contractions, or false labor. Contractions may last for hours, days, or even weeks before true labor sets in. If contractions come at regular intervals, intensify, or become painful, it is best to be seen by your caregiver.  SIGNS OF LABOR   Menstrual-like cramps.  Contractions that are 5 minutes apart or less.  Contractions that start on the top of the uterus and spread down to the lower abdomen and back.  A sense of increased pelvic pressure or back pain.  A watery or bloody mucus discharge that comes from the vagina. If you have any of these signs before the 37th week of pregnancy, call your caregiver right away. You need to go to the hospital to get checked immediately. HOME CARE INSTRUCTIONS   Avoid all smoking, herbs, alcohol, and unprescribed drugs. These chemicals affect the formation and growth of the baby.  Follow your  caregiver's instructions regarding medicine use. There are medicines that are either safe or unsafe to take during pregnancy.  Exercise only as directed by your caregiver. Experiencing uterine cramps is a good sign to stop exercising.  Continue to eat regular, healthy meals.  Wear a good support bra for breast tenderness.  Do not use hot tubs, steam rooms, or saunas.  Wear your seat belt at all times when driving.  Avoid raw meat, uncooked cheese, cat litter boxes, and soil used by cats. These carry germs that can cause birth defects in the baby.  Take your prenatal vitamins.  Try taking a stool softener (if your caregiver approves) if you develop constipation. Eat more high-fiber foods, such as fresh vegetables or fruit and whole grains. Drink plenty of fluids to keep your urine clear or pale yellow.  Take warm sitz baths   to soothe any pain or discomfort caused by hemorrhoids. Use hemorrhoid cream if your caregiver approves.  If you develop varicose veins, wear support hose. Elevate your feet for 15 minutes, 3-4 times a day. Limit salt in your diet.  Avoid heavy lifting, wear low heal shoes, and practice good posture.  Rest a lot with your legs elevated if you have leg cramps or low back pain.  Visit your dentist if you have not gone during your pregnancy. Use a soft toothbrush to brush your teeth and be gentle when you floss.  A sexual relationship may be continued unless your caregiver directs you otherwise.  Do not travel far distances unless it is absolutely necessary and only with the approval of your caregiver.  Take prenatal classes to understand, practice, and ask questions about the labor and delivery.  Make a trial run to the hospital.  Pack your hospital bag.  Prepare the baby's nursery.  Continue to go to all your prenatal visits as directed by your caregiver. SEEK MEDICAL CARE IF:  You are unsure if you are in labor or if your water has broken.  You have  dizziness.  You have mild pelvic cramps, pelvic pressure, or nagging pain in your abdominal area.  You have persistent nausea, vomiting, or diarrhea.  You have a bad smelling vaginal discharge.  You have pain with urination. SEEK IMMEDIATE MEDICAL CARE IF:   You have a fever.  You are leaking fluid from your vagina.  You have spotting or bleeding from your vagina.  You have severe abdominal cramping or pain.  You have rapid weight loss or gain.  You have shortness of breath with chest pain.  You notice sudden or extreme swelling of your face, hands, ankles, feet, or legs.  You have not felt your baby move in over an hour.  You have severe headaches that do not go away with medicine.  You have vision changes. Document Released: 07/24/2001 Document Revised: 08/04/2013 Document Reviewed: 09/30/2012 [facility] Patient Information 2015 [place], [place]. This information is not intended to replace advice given to you by your health care provider. Make sure you discuss any questions you have with your health care provider.

## 2018-06-13 NOTE — Progress Notes (Signed)
   LOW-RISK PREGNANCY VISIT Patient name: Stephanie Johns MRN [id number]  Date of birth: 1996-04-29 Chief Complaint:   Routine Prenatal Visit (PN2 today)  History of Present Illness:   Stephanie Johns is a 22 y.o. G26P0010 female at [redacted]w[redacted]d with an Estimated Date of Delivery: 09/06/18 being seen today for ongoing management of a low-risk pregnancy.  Today she reports no complaints. Contractions: Not present. Vag. Bleeding: None.  Movement: Present. denies leaking of fluid. Review of Systems:   Pertinent items are noted in HPI Denies abnormal vaginal discharge w/ itching/odor/irritation, headaches, visual changes, shortness of breath, chest pain, abdominal pain, severe nausea/vomiting, or problems with urination or bowel movements unless otherwise stated above. Pertinent History Reviewed:  Reviewed past medical,surgical, social, obstetrical and family history.  Reviewed problem list, medications and allergies. Physical Assessment:   Vitals:   06/13/18 1038  BP: 124/83  Pulse: 90  Weight: 203 lb (92.1 kg)  Body mass index is 31.79 kg/m.        Physical Examination:   General appearance: Well appearing, and in no distress  Mental status: Alert, oriented to person, place, and time  Skin: Warm & dry  Cardiovascular: Normal heart rate noted  Respiratory: Normal respiratory effort, no distress  Abdomen: Soft, gravid, nontender  Pelvic: Cervical exam deferred         Extremities: Edema: None  Fetal Status: Fetal Heart Rate (bpm): 148 Fundal Height: 26 cm Movement: Present    Results for orders placed or performed in visit on 06/13/18 (from the past 24 hour(s))  POC Urinalysis Dipstick OB   Collection Time: 06/13/18 10:37 AM  Result Value Ref Range   Color, UA     Clarity, UA     Glucose, UA Negative Negative   Bilirubin, UA     Ketones, UA neg    Spec Grav, UA     Blood, UA neg    pH, UA     POC Protein UA Negative Negative, Trace   Urobilinogen, UA     Nitrite, UA neg    Leukocytes, UA Negative Negative   Appearance     Odor      Assessment & Plan:  1) Low-risk pregnancy G2P0010 at [redacted]w[redacted]d with an Estimated Date of Delivery: 09/06/18   2) H/O DVT,  On Lovenox 80mg  daily, wants supply-sent  3) Fetal EICF x 2, bilateral pyelectasis> neg AFP, repeat u/s next visit   Meds:  Meds ordered this encounter  Medications  . enoxaparin (LOVENOX) 80 MG/0.8ML injection    Sig: Inject 0.8 mLs (80 mg total) into the skin daily.    Dispense:  90 Syringe    Refill:  3    Order Specific Question:   Supervising Provider    Answer:   Lazaro Arms [[id number]]   Labs/procedures today: pn2, tdap  Plan:  Continue routine obstetrical care   Reviewed: Preterm labor symptoms and general obstetric precautions including but not limited to vaginal bleeding, contractions, leaking of fluid and fetal movement were reviewed in detail with the patient.  All questions were answered  Follow-up: Return in about 4 weeks (around 07/11/2018) for LROB, US:OB F/U pyelectasis.  Orders Placed This Encounter  Procedures  . US OB Follow Up  . Tdap vaccine greater than or equal to 7yo IM  . POC Urinalysis Dipstick OB   Cheral Marker CNM, [facility] 06/13/2018 2:01 PM

## 2018-06-14 LAB — CBC
Hematocrit: 33.5 % — ABNORMAL LOW (ref 34.0–46.6)
Hemoglobin: 11.3 g/dL (ref 11.1–15.9)
MCH: 30.7 pg (ref 26.6–33.0)
MCHC: 33.7 g/dL (ref 31.5–35.7)
MCV: 91 fL (ref 79–97)
Platelets: 257 10*3/uL (ref 150–450)
RBC: 3.68 x10E6/uL — ABNORMAL LOW (ref 3.77–5.28)
RDW: 13 % (ref 12.3–15.4)
WBC: 10.2 10*3/uL (ref 3.4–10.8)

## 2018-06-14 LAB — GLUCOSE TOLERANCE, 2 HOURS W/ 1HR
GLUCOSE, 1 HOUR: 143 mg/dL (ref 65–179)
GLUCOSE, 2 HOUR: 100 mg/dL (ref 65–152)
Glucose, Fasting: 81 mg/dL (ref 65–91)

## 2018-06-14 LAB — RPR: RPR Ser Ql: NONREACTIVE

## 2018-06-14 LAB — HIV ANTIBODY (ROUTINE TESTING W REFLEX): HIV Screen 4th Generation wRfx: NONREACTIVE

## 2018-06-14 LAB — ANTIBODY SCREEN: Antibody Screen: NEGATIVE

## 2018-06-30 ENCOUNTER — Other Ambulatory Visit: Payer: Self-pay | Admitting: Women's Health

## 2018-06-30 MED ORDER — VALACYCLOVIR HCL 1 G PO TABS: 2000.0000 mg | ORAL_TABLET | Freq: Two times a day (BID) | ORAL | 1 refills | Status: AC

## 2018-07-14 ENCOUNTER — Ambulatory Visit (INDEPENDENT_AMBULATORY_CARE_PROVIDER_SITE_OTHER): Payer: BLUE CROSS/BLUE SHIELD | Admitting: Obstetrics & Gynecology

## 2018-07-14 ENCOUNTER — Ambulatory Visit (INDEPENDENT_AMBULATORY_CARE_PROVIDER_SITE_OTHER): Payer: BLUE CROSS/BLUE SHIELD

## 2018-07-14 VITALS — BP 122/72 | HR 95 | Wt 208.0 lb

## 2018-07-14 DIAGNOSIS — O35EXX0 Maternal care for other (suspected) fetal abnormality and damage, fetal genitourinary anomalies, not applicable or unspecified: Secondary | ICD-10-CM

## 2018-07-14 DIAGNOSIS — Z3483 Encounter for supervision of other normal pregnancy, third trimester: Secondary | ICD-10-CM

## 2018-07-14 DIAGNOSIS — Z3A32 32 weeks gestation of pregnancy: Secondary | ICD-10-CM

## 2018-07-14 DIAGNOSIS — Z1389 Encounter for screening for other disorder: Secondary | ICD-10-CM

## 2018-07-14 DIAGNOSIS — Z331 Pregnant state, incidental: Secondary | ICD-10-CM

## 2018-07-14 DIAGNOSIS — Z3482 Encounter for supervision of other normal pregnancy, second trimester: Secondary | ICD-10-CM

## 2018-07-14 DIAGNOSIS — O283 Abnormal ultrasonic finding on antenatal screening of mother: Secondary | ICD-10-CM | POA: Diagnosis not present

## 2018-07-14 DIAGNOSIS — O358XX0 Maternal care for other (suspected) fetal abnormality and damage, not applicable or unspecified: Secondary | ICD-10-CM

## 2018-07-14 LAB — POCT URINALYSIS DIPSTICK OB
Glucose, UA: NEGATIVE
KETONES UA: NEGATIVE
LEUKOCYTES UA: NEGATIVE
Nitrite, UA: NEGATIVE
PROTEIN: NEGATIVE
RBC UA: NEGATIVE

## 2018-07-14 NOTE — Progress Notes (Signed)
   LOW-RISK PREGNANCY VISIT Patient name: Stephanie Johns MRN [id number]  Date of birth: [birth year] Chief Complaint:   Routine Prenatal Visit (US today)  History of Present Illness:   Stephanie MadeiraKristen N Crampton is a 22 y.o. 242P0010 female at 7927w2d with an Estimated Date of Delivery: 09/06/18 being seen today for ongoing management of a low-risk pregnancy.  Today she reports no complaints. Contractions: Not present. Vag. Bleeding: None.  Movement: Present. denies leaking of fluid. Review of Systems:   Pertinent items are noted in HPI Denies abnormal vaginal discharge w/ itching/odor/irritation, headaches, visual changes, shortness of breath, chest pain, abdominal pain, severe nausea/vomiting, or problems with urination or bowel movements unless otherwise stated above. Pertinent History Reviewed:  Reviewed past medical,surgical, social, obstetrical and family history.  Reviewed problem list, medications and allergies. Physical Assessment:   Vitals:   07/14/18 1450  BP: 122/72  Pulse: 95  Weight: 208 lb (94.3 kg)  Body mass index is 32.58 kg/m.        Physical Examination:   General appearance: Well appearing, and in no distress  Mental status: Alert, oriented to person, place, and time  Skin: Warm & dry  Cardiovascular: Normal heart rate noted  Respiratory: Normal respiratory effort, no distress  Abdomen: Soft, gravid, nontender  Pelvic: Cervical exam deferred         Extremities: Edema: None  Fetal Status:     Movement: Present    Results for orders placed or performed in visit on 07/14/18 (from the past 24 hour(s))  POC Urinalysis Dipstick OB   Collection Time: 07/14/18  2:56 PM  Result Value Ref Range   Color, UA     Clarity, UA     Glucose, UA Negative Negative   Bilirubin, UA     Ketones, UA neg    Spec Grav, UA     Blood, UA neg    pH, UA     POC,PROTEIN,UA Negative Negative, Trace, Small (1+), Moderate (2+), Large (3+), 4+   Urobilinogen, UA     Nitrite, UA neg    Leukocytes, UA Negative Negative   Appearance     Odor      Assessment & Plan:  1) Low-risk pregnancy G2P0010 at 2527w2d with an Estimated Date of Delivery: 09/06/18   2) Resolved RPD,    Meds: No orders of the defined types were placed in this encounter.  Labs/procedures today: sonogram   Plan:  Continue routine obstetrical care   Reviewed: Preterm labor symptoms and general obstetric precautions including but not limited to vaginal bleeding, contractions, leaking of fluid and fetal movement were reviewed in detail with the patient.  All questions were answered  Follow-up: Return in about 2 weeks (around 07/28/2018) for LROB.  Orders Placed This Encounter  Procedures  . POC Urinalysis Dipstick OB   Amaryllis DykeLuther H Kristell Wooding  07/14/2018 3:30 PM

## 2018-07-14 NOTE — Progress Notes (Signed)
US 32+2 wks,complete breech,anterior placenta gr 3,normal ovaries bilat,resolved renal pelvic dilation,2 LVEICF n/c,fhr 152 bpm,EFW 2138 g 68%

## 2018-07-31 ENCOUNTER — Ambulatory Visit (INDEPENDENT_AMBULATORY_CARE_PROVIDER_SITE_OTHER): Payer: BLUE CROSS/BLUE SHIELD | Admitting: Obstetrics and Gynecology

## 2018-07-31 VITALS — BP 122/68 | HR 83 | Wt 210.0 lb

## 2018-07-31 DIAGNOSIS — Z3A34 34 weeks gestation of pregnancy: Secondary | ICD-10-CM

## 2018-07-31 DIAGNOSIS — Z331 Pregnant state, incidental: Secondary | ICD-10-CM

## 2018-07-31 DIAGNOSIS — Z1389 Encounter for screening for other disorder: Secondary | ICD-10-CM

## 2018-07-31 DIAGNOSIS — Z3483 Encounter for supervision of other normal pregnancy, third trimester: Secondary | ICD-10-CM

## 2018-07-31 LAB — POCT URINALYSIS DIPSTICK OB
Blood, UA: NEGATIVE
Glucose, UA: NEGATIVE
Ketones, UA: NEGATIVE
Leukocytes, UA: NEGATIVE
Nitrite, UA: NEGATIVE
POC,PROTEIN,UA: NEGATIVE

## 2018-07-31 NOTE — Progress Notes (Signed)
Patient ID: Stephanie Johns, female   DOB: [birth year], 22 y.o.   MRN: [id number]    LOW-RISK PREGNANCY VISIT Patient name: Stephanie Johns MRN [id number]  Date of birth: [birth year] Chief Complaint:   Routine Prenatal Visit  History of Present Illness:   Stephanie Johns is a [age] y.o. 862P0010 female at 3741w5d with an Estimated Date of Delivery: 09/06/18 being seen today for ongoing management of a low-risk pregnancy. Is taking lovenox injection from DVT 3 years ago no pulmonary embolism. Today she reports no complaints. Contractions: Not present. Vag. Bleeding: None.  Movement: Present. denies leaking of fluid. Review of Systems:   Pertinent items are noted in HPI Denies abnormal vaginal discharge w/ itching/odor/irritation, headaches, visual changes, shortness of breath, chest pain, abdominal pain, severe nausea/vomiting, or problems with urination or bowel movements unless otherwise stated above. Pertinent History Reviewed:  Reviewed past medical,surgical, social, obstetrical and family history.  Reviewed problem list, medications and allergies. Physical Assessment:   Vitals:   07/31/18 1535  BP: 122/68  Pulse: 83  Weight: 210 lb (95.3 kg)  Body mass index is 32.89 kg/m.        Physical Examination:   General appearance: Well appearing, and in no distress  Mental status: Alert, oriented to person, place, and time  Skin: Warm & dry  Cardiovascular: Normal heart rate noted  Respiratory: Normal respiratory effort, no distress  Abdomen: Soft, gravid, nontender  Pelvic: Cervical exam deferred         Extremities: Edema: None  Fetal Status: Fetal Heart Rate (bpm): 141 Fundal Height: 35 cm Movement: Present    Results for orders placed or performed in visit on 07/31/18 (from the past 24 hour(s))  POC Urinalysis Dipstick OB   Collection Time: 07/31/18  3:34 PM  Result Value Ref Range   Color, UA     Clarity, UA     Glucose, UA Negative Negative   Bilirubin, UA     Ketones, UA neg     Spec Grav, UA     Blood, UA neg    pH, UA     POC,PROTEIN,UA Negative Negative, Trace, Small (1+), Moderate (2+), Large (3+), 4+   Urobilinogen, UA     Nitrite, UA neg    Leukocytes, UA Negative Negative   Appearance     Odor      Assessment & Plan:  1) Low-risk pregnancy G2P0010 at 4741w5d with an Estimated Date of Delivery: 09/06/18    Meds: No orders of the defined types were placed in this encounter.  Labs/procedures today: None  Plan:   1. Continue routine obstetrical care  2. F/u in 2 weeks for gbs, gc/chl  Follow-up: No follow-ups on file.  Orders Placed This Encounter  Procedures  . POC Urinalysis Dipstick OB   By signing my name below, I, Arnette NorrisMari Johnson, attest that this documentation has been prepared under the direction and in the presence of Tilda BurrowFerguson, Toree Edling V, MD. Electronically Signed: Arnette NorrisMari Johnson Medical Scribe. 07/31/18. 3:50 PM.  I personally performed the services described in this documentation, which was SCRIBED in my presence. The recorded information has been reviewed and considered accurate. It has been edited as necessary during review. Tilda BurrowJohn V Burke Terry, MD

## 2018-08-12 ENCOUNTER — Ambulatory Visit (INDEPENDENT_AMBULATORY_CARE_PROVIDER_SITE_OTHER): Payer: BLUE CROSS/BLUE SHIELD | Admitting: Obstetrics & Gynecology

## 2018-08-12 ENCOUNTER — Encounter: Payer: Self-pay | Admitting: Obstetrics & Gynecology

## 2018-08-12 VITALS — BP 120/78 | HR 97 | Wt 208.5 lb

## 2018-08-12 DIAGNOSIS — Z331 Pregnant state, incidental: Secondary | ICD-10-CM

## 2018-08-12 DIAGNOSIS — Z3483 Encounter for supervision of other normal pregnancy, third trimester: Secondary | ICD-10-CM | POA: Diagnosis not present

## 2018-08-12 DIAGNOSIS — Z1389 Encounter for screening for other disorder: Secondary | ICD-10-CM

## 2018-08-12 DIAGNOSIS — Z3A36 36 weeks gestation of pregnancy: Secondary | ICD-10-CM | POA: Diagnosis not present

## 2018-08-12 DIAGNOSIS — Z86718 Personal history of other venous thrombosis and embolism: Secondary | ICD-10-CM

## 2018-08-12 LAB — POCT URINALYSIS DIPSTICK OB
Glucose, UA: NEGATIVE
Ketones, UA: NEGATIVE
Leukocytes, UA: NEGATIVE
Nitrite, UA: NEGATIVE
POC,PROTEIN,UA: NEGATIVE

## 2018-08-12 NOTE — Progress Notes (Signed)
   LOW-RISK PREGNANCY VISIT Patient name: Stephanie Johns MRN [id number]  Date of birth: 1996-04-13 Chief Complaint:   Routine Prenatal Visit (GBS, GC/CHL)  History of Present Illness:   Stephanie Johns is a 22 y.o. 342P0010 female at 7983w3d with an Estimated Date of Delivery: 09/06/18 being seen today for ongoing management of a low-risk pregnancy.  Today she reports no complaints. Contractions: Not present. Vag. Bleeding: None.  Movement: Present. denies leaking of fluid. Review of Systems:   Pertinent items are noted in HPI Denies abnormal vaginal discharge w/ itching/odor/irritation, headaches, visual changes, shortness of breath, chest pain, abdominal pain, severe nausea/vomiting, or problems with urination or bowel movements unless otherwise stated above. Pertinent History Reviewed:  Reviewed past medical,surgical, social, obstetrical and family history.  Reviewed problem list, medications and allergies. Physical Assessment:   Vitals:   08/12/18 1136  BP: 120/78  Pulse: 97  Weight: 208 lb 8 oz (94.6 kg)  Body mass index is 32.66 kg/m.        Physical Examination:   General appearance: Well appearing, and in no distress  Mental status: Alert, oriented to person, place, and time  Skin: Warm & dry  Cardiovascular: Normal heart rate noted  Respiratory: Normal respiratory effort, no distress  Abdomen: Soft, gravid, nontender  Pelvic: Cervical exam performed  Dilation: Closed Effacement (%): Thick Station: -2LTC  Extremities: Edema: None  Fetal Status: Fetal Heart Rate (bpm): 148 Fundal Height: 37 cm Movement: Present Presentation: Vertex  Results for orders placed or performed in visit on 08/12/18 (from the past 24 hour(s))  POC Urinalysis Dipstick OB   Collection Time: 08/12/18 11:37 AM  Result Value Ref Range   Color, UA     Clarity, UA     Glucose, UA Negative Negative   Bilirubin, UA     Ketones, UA neg    Spec Grav, UA     Blood, UA trace    pH, UA     POC,PROTEIN,UA Negative Negative, Trace, Small (1+), Moderate (2+), Large (3+), 4+   Urobilinogen, UA     Nitrite, UA neg    Leukocytes, UA Negative Negative   Appearance     Odor      Assessment & Plan:  1) Low-risk pregnancy G2P0010 at 4083w3d with an Estimated Date of Delivery: 09/06/18   2) Hx of DVT, lovenox 80 mg daily, cervix very unfavorable at this point   Meds: No orders of the defined types were placed in this encounter.  Labs/procedures today: cultures are done  Plan:  Continue routine obstetrical care   Reviewed: Term labor symptoms and general obstetric precautions including but not limited to vaginal bleeding, contractions, leaking of fluid and fetal movement were reviewed in detail with the patient.  All questions were answered  Follow-up: Return in about 1 week (around 08/19/2018) for LROB.  Orders Placed This Encounter  Procedures  . GC/Chlamydia Probe Amp  . Strep Gp B NAA  . POC Urinalysis Dipstick OB   Amaryllis DykeLuther H Kaitlyne Friedhoff  08/12/2018 11:50 AM

## 2018-08-14 LAB — STREP GP B NAA: Strep Gp B NAA: POSITIVE — AB

## 2018-08-14 LAB — GC/CHLAMYDIA PROBE AMP
Chlamydia trachomatis, NAA: NEGATIVE
Neisseria gonorrhoeae by PCR: NEGATIVE

## 2018-08-21 ENCOUNTER — Other Ambulatory Visit: Payer: Self-pay

## 2018-08-21 ENCOUNTER — Ambulatory Visit (INDEPENDENT_AMBULATORY_CARE_PROVIDER_SITE_OTHER): Payer: BLUE CROSS/BLUE SHIELD | Admitting: Obstetrics & Gynecology

## 2018-08-21 ENCOUNTER — Encounter: Payer: Self-pay | Admitting: Obstetrics & Gynecology

## 2018-08-21 VITALS — BP 128/78 | HR 86 | Wt 213.0 lb

## 2018-08-21 DIAGNOSIS — Z86718 Personal history of other venous thrombosis and embolism: Secondary | ICD-10-CM

## 2018-08-21 DIAGNOSIS — Z3A37 37 weeks gestation of pregnancy: Secondary | ICD-10-CM

## 2018-08-21 DIAGNOSIS — Z1389 Encounter for screening for other disorder: Secondary | ICD-10-CM

## 2018-08-21 DIAGNOSIS — Z3483 Encounter for supervision of other normal pregnancy, third trimester: Secondary | ICD-10-CM

## 2018-08-21 DIAGNOSIS — Z331 Pregnant state, incidental: Secondary | ICD-10-CM

## 2018-08-21 LAB — POCT URINALYSIS DIPSTICK OB
Blood, UA: NEGATIVE
GLUCOSE, UA: NEGATIVE
Ketones, UA: NEGATIVE
Leukocytes, UA: NEGATIVE
Nitrite, UA: NEGATIVE
POC,PROTEIN,UA: NEGATIVE

## 2018-08-21 NOTE — Progress Notes (Signed)
.     LOW-RISK PREGNANCY VISIT Patient name: Stephanie Johns MRN [id number]  Date of birth: 20-Apr-1996 Chief Complaint:   Routine Prenatal Visit  History of Present Illness:   Stephanie Johns is a 23 y.o. G42P0010 female at [redacted]w[redacted]d with an Estimated Date of Delivery: 09/06/18 being seen today for ongoing management of a low-risk pregnancy.  Today she reports no complaints. Contractions: Irregular. Vag. Bleeding: None.  Movement: Present. denies leaking of fluid. Review of Systems:   Pertinent items are noted in HPI Denies abnormal vaginal discharge w/ itching/odor/irritation, headaches, visual changes, shortness of breath, chest pain, abdominal pain, severe nausea/vomiting, or problems with urination or bowel movements unless otherwise stated above. Pertinent History Reviewed:  Reviewed past medical,surgical, social, obstetrical and family history.  Reviewed problem list, medications and allergies. Physical Assessment:   Vitals:   08/21/18 1350  BP: 128/78  Pulse: 86  Weight: 213 lb (96.6 kg)  Body mass index is 33.36 kg/m.        Physical Examination:   General appearance: Well appearing, and in no distress  Mental status: Alert, oriented to person, place, and time  Skin: Warm & dry  Cardiovascular: Normal heart rate noted  Respiratory: Normal respiratory effort, no distress  Abdomen: Soft, gravid, nontender  Pelvic: Cervical exam performed       LTC vertex  Extremities: Edema: None  Fetal Status: Fetal Heart Rate (bpm): 132 Fundal Height: 37 cm Movement: Present    Results for orders placed or performed in visit on 08/21/18 (from the past 24 hour(s))  POC Urinalysis Dipstick OB   Collection Time: 08/21/18  1:51 PM  Result Value Ref Range   Color, UA     Clarity, UA     Glucose, UA Negative Negative   Bilirubin, UA     Ketones, UA neg    Spec Grav, UA     Blood, UA neg    pH, UA     POC,PROTEIN,UA Negative Negative, Trace, Small (1+), Moderate (2+), Large (3+), 4+   Urobilinogen, UA     Nitrite, UA neg    Leukocytes, UA Negative Negative   Appearance     Odor      Assessment & Plan:  1) Low-risk pregnancy G2P0010 at [redacted]w[redacted]d with an Estimated Date of Delivery: 09/06/18   2) Hx of DVT, currently on full dose lovenox 80 mg,    Meds: No orders of the defined types were placed in this encounter.  Labs/procedures today:   Plan:  Continue routine obstetrical care   Reviewed: Term labor symptoms and general obstetric precautions including but not limited to vaginal bleeding, contractions, leaking of fluid and fetal movement were reviewed in detail with the patient.  All questions were answered  Follow-up: Return in about 1 week (around 08/28/2018) for LROB.  Orders Placed This Encounter  Procedures  . POC Urinalysis Dipstick OB   Amaryllis Dyke Jakye Mullens  08/21/2018 2:03 PM

## 2018-08-29 ENCOUNTER — Encounter: Payer: Self-pay | Admitting: Obstetrics & Gynecology

## 2018-08-29 ENCOUNTER — Ambulatory Visit (INDEPENDENT_AMBULATORY_CARE_PROVIDER_SITE_OTHER): Payer: BLUE CROSS/BLUE SHIELD | Admitting: Obstetrics & Gynecology

## 2018-08-29 VITALS — BP 117/82 | HR 80 | Wt 212.0 lb

## 2018-08-29 DIAGNOSIS — Z3A38 38 weeks gestation of pregnancy: Secondary | ICD-10-CM

## 2018-08-29 DIAGNOSIS — Z3483 Encounter for supervision of other normal pregnancy, third trimester: Secondary | ICD-10-CM

## 2018-08-29 NOTE — Progress Notes (Signed)
   LOW-RISK PREGNANCY VISIT Patient name: Stephanie Johns MRN [id number]  Date of birth: [birth year] Chief Complaint:   Routine Prenatal Visit  History of Present Illness:   Stephanie Johns is a 23 y.o. G54P0010 female at [redacted]w[redacted]d with an Estimated Date of Delivery: 09/06/18 being seen today for ongoing management of a low-risk pregnancy.  Today she reports no complaints. Contractions: Irregular. Vag. Bleeding: None.  Movement: Present. denies leaking of fluid. Review of Systems:   Pertinent items are noted in HPI Denies abnormal vaginal discharge w/ itching/odor/irritation, headaches, visual changes, shortness of breath, chest pain, abdominal pain, severe nausea/vomiting, or problems with urination or bowel movements unless otherwise stated above. Pertinent History Reviewed:  Reviewed past medical,surgical, social, obstetrical and family history.  Reviewed problem list, medications and allergies. Physical Assessment:   Vitals:   08/29/18 1239  BP: 117/82  Pulse: 80  Weight: 212 lb (96.2 kg)  Body mass index is 33.2 kg/m.        Physical Examination:   General appearance: Well appearing, and in no distress  Mental status: Alert, oriented to person, place, and time  Skin: Warm & dry  Cardiovascular: Normal heart rate noted  Respiratory: Normal respiratory effort, no distress  Abdomen: Soft, gravid, nontender  Pelvic: Cervical exam deferred         Extremities: Edema: None  Fetal Status:     Movement: Present    No results found for this or any previous visit (from the past 24 hour(s)).  Assessment & Plan:  1) Low-risk pregnancy G2P0010 at [redacted]w[redacted]d with an Estimated Date of Delivery: 09/06/18   2) Hx of DVT on lovenox 80, no indication to stop at this point, pt aware of need to be off 18-24 hours before epidural given but risk of stopping is not advised even to convert to heparin   Meds: No orders of the defined types were placed in this encounter.  Labs/procedures today:   Plan:   Continue routine obstetrical care   Reviewed: Term labor symptoms and general obstetric precautions including but not limited to vaginal bleeding, contractions, leaking of fluid and fetal movement were reviewed in detail with the patient.  All questions were answered  Follow-up: Return in about 1 week (around 09/05/2018) for LROB.  No orders of the defined types were placed in this encounter.  Lazaro Arms 08/29/2018 1:18 PM

## 2018-09-04 ENCOUNTER — Telehealth (HOSPITAL_COMMUNITY): Payer: Self-pay | Admitting: *Deleted

## 2018-09-04 ENCOUNTER — Encounter (HOSPITAL_COMMUNITY): Payer: Self-pay | Admitting: *Deleted

## 2018-09-04 ENCOUNTER — Ambulatory Visit (INDEPENDENT_AMBULATORY_CARE_PROVIDER_SITE_OTHER): Payer: BLUE CROSS/BLUE SHIELD | Admitting: Obstetrics and Gynecology

## 2018-09-04 ENCOUNTER — Encounter: Payer: Self-pay | Admitting: Obstetrics and Gynecology

## 2018-09-04 VITALS — BP 134/81 | HR 106 | Wt 213.0 lb

## 2018-09-04 DIAGNOSIS — Z1389 Encounter for screening for other disorder: Secondary | ICD-10-CM

## 2018-09-04 DIAGNOSIS — Z3483 Encounter for supervision of other normal pregnancy, third trimester: Secondary | ICD-10-CM

## 2018-09-04 DIAGNOSIS — Z331 Pregnant state, incidental: Secondary | ICD-10-CM

## 2018-09-04 DIAGNOSIS — Z3A39 39 weeks gestation of pregnancy: Secondary | ICD-10-CM

## 2018-09-04 LAB — POCT URINALYSIS DIPSTICK OB
Blood, UA: NEGATIVE
Glucose, UA: NEGATIVE
Leukocytes, UA: NEGATIVE
Nitrite, UA: NEGATIVE
POC,PROTEIN,UA: NEGATIVE

## 2018-09-04 NOTE — Telephone Encounter (Signed)
Preadmission screen  

## 2018-09-04 NOTE — Progress Notes (Addendum)
LOW-RISK PREGNANCY VISIT Patient name: OSMARA LIESER MRN [id number]  Date of birth: [birth year] Chief Complaint:   Routine Prenatal Visit (having Braxton Hick's contractions)  History of Present Illness:   MIECHELLE DEVEREAUX is a 23 y.o. G74P0010 female at [redacted]w[redacted]d with an Estimated Date of Delivery: 09/06/18 being seen today for ongoing management of a low-risk pregnancy. She reports that on the drive here (~40 mins) she was having contractions every couple of minutes. She continues on lovenox with good tolerance.   Today she reports occasional contractions. Contractions: Irregular. Vag. Bleeding: None.  Movement: Present. denies leaking of fluid. Review of Systems:   Pertinent items are noted in HPI Denies abnormal vaginal discharge w/ itching/odor/irritation, headaches, visual changes, shortness of breath, chest pain, abdominal pain, severe nausea/vomiting, or problems with urination or bowel movements unless otherwise stated above. Pertinent History Reviewed:  Reviewed past medical,surgical, social, obstetrical and family history.  Reviewed problem list, medications and allergies. Physical Assessment:   Vitals:   09/04/18 1340  BP: 134/81  Pulse: (!) 106  Weight: 213 lb (96.6 kg)  Body mass index is 33.36 kg/m.        Physical Examination:   General appearance: Well appearing, and in no distress  Mental status: Alert, oriented to person, place, and time  Skin: Warm & dry  Cardiovascular: Normal heart rate noted  Respiratory: Normal respiratory effort, no distress  Abdomen: Soft, gravid, nontender  Pelvic: Cervical exam performed         Extremities: Edema: None  Fetal Status:     Movement: Present    Results for orders placed or performed in visit on 09/04/18 (from the past 24 hour(s))  POC Urinalysis Dipstick OB   Collection Time: 09/04/18  1:38 PM  Result Value Ref Range   Color, UA     Clarity, UA     Glucose, UA Negative Negative   Bilirubin, UA     Ketones, UA 1+      Spec Grav, UA     Blood, UA neg    pH, UA     POC,PROTEIN,UA Negative Negative, Trace, Small (1+), Moderate (2+), Large (3+), 4+   Urobilinogen, UA     Nitrite, UA neg    Leukocytes, UA Negative Negative   Appearance     Odor      Assessment & Plan:  1) Low-risk pregnancy G2P0010 at [redacted]w[redacted]d with an Estimated Date of Delivery: 09/06/18     Meds: No orders of the defined types were placed in this encounter.  Labs/procedures today:   Plan:  Continue routine obstetrical care. Follow up in 5 days for NST. Follow up for induction at [facility] on Saturday, 09/13/2018 at 7:30 AM   Reviewed: Term labor symptoms and general obstetric precautions including but not limited to vaginal bleeding, contractions, leaking of fluid and fetal movement were reviewed in detail with the patient.  All questions were answered  Follow-up: No follow-ups on file.  Orders Placed This Encounter  Procedures  . POC Urinalysis Dipstick OB   Tilda Burrow, MD 09/04/2018 1:50 PM 2 By signing my name below, I, Soijett Blue, attest that this documentation has been prepared under the direction and in the presence of Tilda Burrow, MD. Electronically Signed: Soijett Blue, Stage manager. 09/04/18. 1:50 PM.  I personally performed the services described in this documentation, which was SCRIBED in my presence. The recorded information has been reviewed and considered accurate. It has been edited as necessary during review. Cyndi Lennert  Glo Herring, MD

## 2018-09-07 ENCOUNTER — Other Ambulatory Visit: Payer: Self-pay | Admitting: Family Medicine

## 2018-09-09 ENCOUNTER — Encounter: Payer: Self-pay | Admitting: Women's Health

## 2018-09-09 ENCOUNTER — Ambulatory Visit (INDEPENDENT_AMBULATORY_CARE_PROVIDER_SITE_OTHER): Payer: BLUE CROSS/BLUE SHIELD | Admitting: Women's Health

## 2018-09-09 VITALS — BP 100/70 | HR 84 | Wt 214.0 lb

## 2018-09-09 DIAGNOSIS — O480 Post-term pregnancy: Secondary | ICD-10-CM | POA: Diagnosis not present

## 2018-09-09 DIAGNOSIS — Z3A40 40 weeks gestation of pregnancy: Secondary | ICD-10-CM

## 2018-09-09 DIAGNOSIS — Z3483 Encounter for supervision of other normal pregnancy, third trimester: Secondary | ICD-10-CM

## 2018-09-09 DIAGNOSIS — Z331 Pregnant state, incidental: Secondary | ICD-10-CM

## 2018-09-09 DIAGNOSIS — Z1389 Encounter for screening for other disorder: Secondary | ICD-10-CM

## 2018-09-09 LAB — POCT URINALYSIS DIPSTICK OB
Blood, UA: NEGATIVE
GLUCOSE, UA: NEGATIVE
Leukocytes, UA: NEGATIVE
Nitrite, UA: NEGATIVE
POC,PROTEIN,UA: NEGATIVE

## 2018-09-09 NOTE — Patient Instructions (Signed)
Janene MadeiraKristen N Glaude, I greatly value your feedback.  If you receive a survey following your visit with us today, we appreciate you taking the time to fill it out.  Thanks, Joellyn HaffKim Wanette Robison, CNM, WHNP-BC   Call the office [phone]) or go to [facility] if:  You begin to have strong, frequent contractions  Your water breaks.  Sometimes it is a big gush of fluid, sometimes it is just a trickle that keeps getting your panties wet or running down your legs  You have vaginal bleeding.  It is normal to have a small amount of spotting if your cervix was checked.   You don't feel your baby moving like normal.  If you don't, get you something to eat and drink and lay down and focus on feeling your baby move.  You should feel at least 10 movements in 2 hours.  If you don't, you should call the office or go to [facility].     Call the office [phone]) or go to [facility] for these signs of pre-eclampsia:  Severe headache that does not go away with Tylenol  Visual changes- seeing spots, double, blurred vision  Pain under your right breast or upper abdomen that does not go away with Tums or heartburn medicine  Nausea and/or vomiting  Severe swelling in your hands, feet, and face      Braxton Hicks Contractions Contractions of the uterus can occur throughout pregnancy, but they are not always a sign that you are in labor. You may have practice contractions called Braxton Hicks contractions. These false labor contractions are sometimes confused with true labor. What are Deberah PeltonBraxton Hicks contractions? Braxton Hicks contractions are tightening movements that occur in the muscles of the uterus before labor. Unlike true labor contractions, these contractions do not result in opening (dilation) and thinning of the cervix. Toward the end of pregnancy (32-34 weeks), Braxton Hicks contractions can happen more often and may become stronger. These contractions are sometimes difficult to tell apart from  true labor because they can be very uncomfortable. You should not feel embarrassed if you go to the hospital with false labor. Sometimes, the only way to tell if you are in true labor is for your health care provider to look for changes in the cervix. The health care provider will do a physical exam and may monitor your contractions. If you are not in true labor, the exam should show that your cervix is not dilating and your water has not broken. If there are no other health problems associated with your pregnancy, it is completely safe for you to be sent home with false labor. You may continue to have Braxton Hicks contractions until you go into true labor. How to tell the difference between true labor and false labor True labor  Contractions last 30-70 seconds.  Contractions become very regular.  Discomfort is usually felt in the top of the uterus, and it spreads to the lower abdomen and low back.  Contractions do not go away with walking.  Contractions usually become more intense and increase in frequency.  The cervix dilates and gets thinner. False labor  Contractions are usually shorter and not as strong as true labor contractions.  Contractions are usually irregular.  Contractions are often felt in the front of the lower abdomen and in the groin.  Contractions may go away when you walk around or change positions while lying down.  Contractions get weaker and are shorter-lasting as time goes on.  The cervix  usually does not dilate or become thin. Follow these instructions at home:   Take over-the-counter and prescription medicines only as told by your health care provider.  Keep up with your usual exercises and follow other instructions from your health care provider.  Eat and drink lightly if you think you are going into labor.  If Braxton Hicks contractions are making you uncomfortable: ? Change your position from lying down or resting to walking, or change from walking  to resting. ? Sit and rest in a tub of warm water. ? Drink enough fluid to keep your urine pale yellow. Dehydration may cause these contractions. ? Do slow and deep breathing several times an hour.  Keep all follow-up prenatal visits as told by your health care provider. This is important. Contact a health care provider if:  You have a fever.  You have continuous pain in your abdomen. Get help right away if:  Your contractions become stronger, more regular, and closer together.  You have fluid leaking or gushing from your vagina.  You pass blood-tinged mucus (bloody show).  You have bleeding from your vagina.  You have low back pain that you never had before.  You feel your baby's head pushing down and causing pelvic pressure.  Your baby is not moving inside you as much as it used to. Summary  Contractions that occur before labor are called Braxton Hicks contractions, false labor, or practice contractions.  Braxton Hicks contractions are usually shorter, weaker, farther apart, and less regular than true labor contractions. True labor contractions usually become progressively stronger and regular, and they become more frequent.  Manage discomfort from [facility] contractions by changing position, resting in a warm bath, drinking plenty of water, or practicing deep breathing. This information is not intended to replace advice given to you by your health care provider. Make sure you discuss any questions you have with your health care provider. Document Released: 12/13/2016 Document Revised: 05/14/2017 Document Reviewed: 12/13/2016 Elsevier Interactive Patient Education  2019 [employer]

## 2018-09-09 NOTE — Progress Notes (Signed)
   LOW-RISK PREGNANCY VISIT Patient name: Stephanie Johns MRN [id number]  Date of birth: 1996/04/05 Chief Complaint:   Routine Prenatal Visit (NST)  History of Present Illness:   Stephanie Johns is a 23 y.o. 692P0010 female at 2365w3d with an Estimated Date of Delivery: 09/06/18 being seen today for ongoing management of a low-risk pregnancy. On Lovenox 80mg  daily for h/o DVT Initial bp elevated, no proteinuria. Today she reports mild headaches, hasn't taken anything. Denies visual changes, ruq/epigastric pain, n/v. Contractions: Irritability. Vag. Bleeding: None.  Movement: Present. denies leaking of fluid. Review of Systems:   Pertinent items are noted in HPI Denies abnormal vaginal discharge w/ itching/odor/irritation, headaches, visual changes, shortness of breath, chest pain, abdominal pain, severe nausea/vomiting, or problems with urination or bowel movements unless otherwise stated above. Pertinent History Reviewed:  Reviewed past medical,surgical, social, obstetrical and family history.  Reviewed problem list, medications and allergies. Physical Assessment:   Vitals:   09/09/18 1110 09/09/18 1117 09/09/18 1145  BP: 140/66 125/83 100/70  Pulse: 87 84   Weight: 214 lb (97.1 kg)    Body mass index is 33.52 kg/m.        Physical Examination:   General appearance: Well appearing, and in no distress  Mental status: Alert, oriented to person, place, and time  Skin: Warm & dry  Cardiovascular: Normal heart rate noted  Respiratory: Normal respiratory effort, no distress  Abdomen: Soft, gravid, nontender  Pelvic: Cervical exam deferred         Extremities: Edema: None  Fetal Status: Fetal Heart Rate (bpm): 135 Fundal Height: 40 cm Movement: Present Presentation: Vertex by Leopold's  NST: FHR baseline 135 bpm, Variability: moderate, Accelerations:present, Decelerations:  Absent= Cat 1/Reactive Toco: occasional, mild    Results for orders placed or performed in visit on 09/09/18  (from the past 24 hour(s))  POC Urinalysis Dipstick OB   Collection Time: 09/09/18 11:07 AM  Result Value Ref Range   Color, UA     Clarity, UA     Glucose, UA Negative Negative   Bilirubin, UA     Ketones, UA small    Spec Grav, UA     Blood, UA neg    pH, UA     POC,PROTEIN,UA Negative Negative, Trace, Small (1+), Moderate (2+), Large (3+), 4+   Urobilinogen, UA     Nitrite, UA neg    Leukocytes, UA Negative Negative   Appearance     Odor      Assessment & Plan:  1) Low-risk pregnancy G2P0010 at 23565w3d with an Estimated Date of Delivery: 09/06/18   2) H/O DVT,  On Lovenox 80mg  daily, to stop Friday  3) 40+wks> NST reactive today, IOL as scheduled 2/1 AM  4) Initially elevated bp> repeat normal x 2, last 100/70 manually by me, no proteinuria, mild headache- this does not appear to be r/t bp. Reviewed pre-e s/s, reasons to seek care   Meds: No orders of the defined types were placed in this encounter.  Labs/procedures today: nst  Plan:  Continue routine obstetrical care   Reviewed: Term labor symptoms and general obstetric precautions including but not limited to vaginal bleeding, contractions, leaking of fluid and fetal movement were reviewed in detail with the patient.  All questions were answered  Follow-up: No follow-ups on file.  Orders Placed This Encounter  Procedures  . POC Urinalysis Dipstick OB   Cheral MarkerKimberly R Elene Downum CNM, [facility]BC 09/09/2018 11:49 AM

## 2018-09-12 ENCOUNTER — Other Ambulatory Visit: Payer: Self-pay | Admitting: Obstetrics and Gynecology

## 2018-09-12 NOTE — Progress Notes (Signed)
Orders noted in place by L Wallace dated 09/07/2018.  The assistance is appreciated.

## 2018-09-13 ENCOUNTER — Inpatient Hospital Stay (HOSPITAL_COMMUNITY)
Admission: AD | Admit: 2018-09-13 | Discharge: 2018-09-15 | DRG: 788 | Disposition: A | Discharge status: Home / Self Care | Payer: BLUE CROSS/BLUE SHIELD | Attending: Family Medicine | Admitting: Family Medicine

## 2018-09-13 ENCOUNTER — Inpatient Hospital Stay (HOSPITAL_COMMUNITY): Payer: BLUE CROSS/BLUE SHIELD | Admitting: Anesthesiology

## 2018-09-13 ENCOUNTER — Encounter (HOSPITAL_COMMUNITY)
Admission: AD | Disposition: A | Discharge status: Home / Self Care | Payer: Self-pay | Source: Home / Self Care | Attending: Family Medicine

## 2018-09-13 ENCOUNTER — Encounter (HOSPITAL_COMMUNITY): Payer: Self-pay

## 2018-09-13 ENCOUNTER — Inpatient Hospital Stay (HOSPITAL_COMMUNITY)
Admission: RE | Admit: 2018-09-13 | Payer: 59 | Source: Ambulatory Visit | Attending: Obstetrics and Gynecology | Admitting: Obstetrics and Gynecology

## 2018-09-13 DIAGNOSIS — O99824 Streptococcus B carrier state complicating childbirth: Secondary | ICD-10-CM | POA: Diagnosis present

## 2018-09-13 DIAGNOSIS — Z86718 Personal history of other venous thrombosis and embolism: Secondary | ICD-10-CM | POA: Diagnosis not present

## 2018-09-13 DIAGNOSIS — L918 Other hypertrophic disorders of the skin: Secondary | ICD-10-CM | POA: Diagnosis not present

## 2018-09-13 DIAGNOSIS — Z3A41 41 weeks gestation of pregnancy: Secondary | ICD-10-CM

## 2018-09-13 DIAGNOSIS — O480 Post-term pregnancy: Secondary | ICD-10-CM | POA: Diagnosis present

## 2018-09-13 DIAGNOSIS — Z23 Encounter for immunization: Secondary | ICD-10-CM | POA: Diagnosis not present

## 2018-09-13 DIAGNOSIS — Q825 Congenital non-neoplastic nevus: Secondary | ICD-10-CM | POA: Diagnosis not present

## 2018-09-13 DIAGNOSIS — O321XX0 Maternal care for breech presentation, not applicable or unspecified: Secondary | ICD-10-CM | POA: Diagnosis not present

## 2018-09-13 DIAGNOSIS — Q170 Accessory auricle: Secondary | ICD-10-CM | POA: Diagnosis not present

## 2018-09-13 DIAGNOSIS — Z412 Encounter for routine and ritual male circumcision: Secondary | ICD-10-CM | POA: Diagnosis not present

## 2018-09-13 LAB — CBC
HCT: 33.4 % — ABNORMAL LOW (ref 36.0–46.0)
HCT: 30.2 % — ABNORMAL LOW (ref 36.0–46.0)
HEMOGLOBIN: 11 g/dL — AB (ref 12.0–15.0)
Hemoglobin: 10.2 g/dL — ABNORMAL LOW (ref 12.0–15.0)
MCH: 30.6 pg (ref 26.0–34.0)
MCH: 31 pg (ref 26.0–34.0)
MCHC: 32.9 g/dL (ref 30.0–36.0)
MCHC: 33.8 g/dL (ref 30.0–36.0)
MCV: 92.8 fL (ref 80.0–100.0)
MCV: 91.8 fL (ref 80.0–100.0)
Platelets: 215 10*3/uL (ref 150–400)
Platelets: 175 10*3/uL (ref 150–400)
RBC: 3.6 MIL/uL — ABNORMAL LOW (ref 3.87–5.11)
RBC: 3.29 MIL/uL — ABNORMAL LOW (ref 3.87–5.11)
RDW: 14 % (ref 11.5–15.5)
RDW: 13.8 % (ref 11.5–15.5)
WBC: 11.4 10*3/uL — ABNORMAL HIGH (ref 4.0–10.5)
WBC: 18.7 10*3/uL — ABNORMAL HIGH (ref 4.0–10.5)
nRBC: 0 % (ref 0.0–0.2)
nRBC: 0 % (ref 0.0–0.2)

## 2018-09-13 LAB — RPR: RPR Ser Ql: NONREACTIVE

## 2018-09-13 LAB — COMPREHENSIVE METABOLIC PANEL
ALK PHOS: 117 U/L (ref 38–126)
ALT: 22 U/L (ref 0–44)
AST: 31 U/L (ref 15–41)
Albumin: 3.3 g/dL — ABNORMAL LOW (ref 3.5–5.0)
Anion gap: 9 (ref 5–15)
BUN: 12 mg/dL (ref 6–20)
CALCIUM: 8.5 mg/dL — AB (ref 8.9–10.3)
CO2: 19 mmol/L — ABNORMAL LOW (ref 22–32)
Chloride: 105 mmol/L (ref 98–111)
Creatinine, Ser: 0.52 mg/dL (ref 0.44–1.00)
GFR calc Af Amer: >60 mL/min (ref >60)
GFR calc non Af Amer: >60 mL/min (ref >60)
Glucose, Bld: 107 mg/dL — ABNORMAL HIGH (ref 70–99)
Potassium: 3.9 mmol/L (ref 3.5–5.1)
Sodium: 133 mmol/L — ABNORMAL LOW (ref 135–145)
Total Bilirubin: 0.5 mg/dL (ref 0.3–1.2)
Total Protein: 6.6 g/dL (ref 6.5–8.1)

## 2018-09-13 LAB — TYPE AND SCREEN
ABO/RH(D): A POS
Antibody Screen: NEGATIVE

## 2018-09-13 LAB — PROTEIN / CREATININE RATIO, URINE
Creatinine, Urine: 39 mg/dL
Protein Creatinine Ratio: 0.23 mg/mg{Cre} — ABNORMAL HIGH (ref 0.00–0.15)
Total Protein, Urine: 9 mg/dL

## 2018-09-13 LAB — ABO/RH: ABO/RH(D): A POS

## 2018-09-13 SURGERY — Surgical Case
Anesthesia: Spinal

## 2018-09-13 MED ORDER — ONDANSETRON HCL 4 MG/2ML IJ SOLN
INTRAMUSCULAR | Status: DC | PRN
  Administered 2018-09-13: 4 mg via INTRAVENOUS

## 2018-09-13 MED ORDER — FENTANYL CITRATE (PF) 100 MCG/2ML IJ SOLN
INTRAMUSCULAR | Status: DC | PRN
  Administered 2018-09-13: 15 ug via INTRATHECAL

## 2018-09-13 MED ORDER — PROMETHAZINE HCL 25 MG/ML IJ SOLN: 6.2500 mg | INTRAMUSCULAR | Status: DC | PRN

## 2018-09-13 MED ORDER — CEFAZOLIN SODIUM-DEXTROSE 2-4 GM/100ML-% IV SOLN
INTRAVENOUS | Status: AC
  Filled 2018-09-13: qty 100

## 2018-09-13 MED ORDER — HYDROCODONE-ACETAMINOPHEN 7.5-325 MG PO TABS: 1.0000 | ORAL_TABLET | Freq: Once | ORAL | Status: DC | PRN

## 2018-09-13 MED ORDER — ACETAMINOPHEN 10 MG/ML IV SOLN: 1000.0000 mg | Freq: Once | INTRAVENOUS | Status: DC | PRN

## 2018-09-13 MED ORDER — BUPIVACAINE HCL (PF) 0.25 % IJ SOLN
INTRAMUSCULAR | Status: AC
  Filled 2018-09-13: qty 30

## 2018-09-13 MED ORDER — MEPERIDINE HCL 25 MG/ML IJ SOLN: 6.2500 mg | INTRAMUSCULAR | Status: DC | PRN

## 2018-09-13 MED ORDER — CEFAZOLIN SODIUM-DEXTROSE 2-4 GM/100ML-% IV SOLN
2.0000 g | INTRAVENOUS | Status: AC
  Administered 2018-09-13: 2 g via INTRAVENOUS
  Filled 2018-09-13: qty 100

## 2018-09-13 MED ORDER — PHENYLEPHRINE 8 MG IN D5W 100 ML (0.08MG/ML) PREMIX OPTIME
INJECTION | INTRAVENOUS | Status: AC
  Filled 2018-09-13: qty 100

## 2018-09-13 MED ORDER — SODIUM CHLORIDE 0.9% FLUSH: 3.0000 mL | INTRAVENOUS | Status: DC | PRN

## 2018-09-13 MED ORDER — BUPIVACAINE IN DEXTROSE 0.75-8.25 % IT SOLN
INTRATHECAL | Status: DC | PRN
  Administered 2018-09-13: 1.6 mL via INTRATHECAL

## 2018-09-13 MED ORDER — OXYTOCIN 40 UNITS IN NORMAL SALINE INFUSION - SIMPLE MED: 2.5000 [IU]/h | INTRAVENOUS | Status: AC

## 2018-09-13 MED ORDER — TETANUS-DIPHTH-ACELL PERTUSSIS 5-2.5-18.5 LF-MCG/0.5 IM SUSP: 0.5000 mL | Freq: Once | INTRAMUSCULAR | Status: DC

## 2018-09-13 MED ORDER — SIMETHICONE 80 MG PO CHEW
80.0000 mg | CHEWABLE_TABLET | ORAL | Status: DC
  Administered 2018-09-13 – 2018-09-14 (×2): 80 mg via ORAL
  Filled 2018-09-13 (×2): qty 1

## 2018-09-13 MED ORDER — DIPHENHYDRAMINE HCL 25 MG PO CAPS
25.0000 mg | ORAL_CAPSULE | Freq: Four times a day (QID) | ORAL | Status: DC | PRN
  Administered 2018-09-13: 25 mg via ORAL
  Filled 2018-09-13: qty 1

## 2018-09-13 MED ORDER — NALBUPHINE HCL 10 MG/ML IJ SOLN
5.0000 mg | INTRAMUSCULAR | Status: DC | PRN
  Administered 2018-09-13: 5 mg via INTRAVENOUS

## 2018-09-13 MED ORDER — LACTATED RINGERS IV SOLN
INTRAVENOUS | Status: DC | PRN
  Administered 2018-09-13: 04:00:00 via INTRAVENOUS

## 2018-09-13 MED ORDER — SOD CITRATE-CITRIC ACID 500-334 MG/5ML PO SOLN
30.0000 mL | ORAL | Status: DC
  Filled 2018-09-13: qty 15

## 2018-09-13 MED ORDER — ONDANSETRON HCL 4 MG/2ML IJ SOLN: 4.0000 mg | Freq: Three times a day (TID) | INTRAMUSCULAR | Status: DC | PRN

## 2018-09-13 MED ORDER — FAMOTIDINE IN NACL 20-0.9 MG/50ML-% IV SOLN
20.0000 mg | Freq: Once | INTRAVENOUS | Status: AC
  Administered 2018-09-13: 20 mg via INTRAVENOUS
  Filled 2018-09-13: qty 50

## 2018-09-13 MED ORDER — OXYTOCIN 10 UNIT/ML IJ SOLN
INTRAVENOUS | Status: DC | PRN
  Administered 2018-09-13: 40 [IU] via INTRAVENOUS

## 2018-09-13 MED ORDER — OXYTOCIN 10 UNIT/ML IJ SOLN
INTRAMUSCULAR | Status: AC
  Filled 2018-09-13: qty 4

## 2018-09-13 MED ORDER — LACTATED RINGERS IV SOLN: INTRAVENOUS | Status: DC

## 2018-09-13 MED ORDER — SODIUM CHLORIDE 0.9 % IV SOLN
500.0000 mg | Freq: Once | INTRAVENOUS | Status: AC
  Administered 2018-09-13: 500 mg via INTRAVENOUS
  Filled 2018-09-13: qty 500

## 2018-09-13 MED ORDER — LACTATED RINGERS IV SOLN
INTRAVENOUS | Status: DC
  Administered 2018-09-13 (×2): via INTRAVENOUS

## 2018-09-13 MED ORDER — ENOXAPARIN SODIUM 80 MG/0.8ML ~~LOC~~ SOLN
80.0000 mg | SUBCUTANEOUS | Status: DC
  Administered 2018-09-14 – 2018-09-15 (×2): 80 mg via SUBCUTANEOUS
  Filled 2018-09-13 (×3): qty 0.8

## 2018-09-13 MED ORDER — NALOXONE HCL 0.4 MG/ML IJ SOLN: 0.4000 mg | INTRAMUSCULAR | Status: DC | PRN

## 2018-09-13 MED ORDER — DIPHENHYDRAMINE HCL 50 MG/ML IJ SOLN: 12.5000 mg | INTRAMUSCULAR | Status: DC | PRN

## 2018-09-13 MED ORDER — OXYCODONE HCL 5 MG PO TABS
5.0000 mg | ORAL_TABLET | ORAL | Status: DC | PRN
  Administered 2018-09-14: 5 mg via ORAL
  Filled 2018-09-13: qty 1

## 2018-09-13 MED ORDER — PRENATAL MULTIVITAMIN CH
1.0000 | ORAL_TABLET | Freq: Every day | ORAL | Status: DC
  Administered 2018-09-13 – 2018-09-14 (×2): 1 via ORAL
  Filled 2018-09-13 (×3): qty 1

## 2018-09-13 MED ORDER — FENTANYL CITRATE (PF) 100 MCG/2ML IJ SOLN
INTRAMUSCULAR | Status: AC
  Filled 2018-09-13: qty 2

## 2018-09-13 MED ORDER — NALBUPHINE HCL 10 MG/ML IJ SOLN
5.0000 mg | Freq: Once | INTRAMUSCULAR | Status: DC | PRN
  Filled 2018-09-13: qty 1

## 2018-09-13 MED ORDER — ACETAMINOPHEN 10 MG/ML IV SOLN
INTRAVENOUS | Status: AC
  Administered 2018-09-13: 1000 mg
  Filled 2018-09-13: qty 100

## 2018-09-13 MED ORDER — DIBUCAINE 1 % RE OINT: 1.0000 "application " | TOPICAL_OINTMENT | RECTAL | Status: DC | PRN

## 2018-09-13 MED ORDER — NALBUPHINE HCL 10 MG/ML IJ SOLN: 5.0000 mg | INTRAMUSCULAR | Status: DC | PRN

## 2018-09-13 MED ORDER — SODIUM CHLORIDE 0.9 % IR SOLN
Status: DC | PRN
  Administered 2018-09-13: 1000 mL

## 2018-09-13 MED ORDER — DEXAMETHASONE SODIUM PHOSPHATE 10 MG/ML IJ SOLN
INTRAMUSCULAR | Status: DC | PRN
  Administered 2018-09-13: 10 mg via INTRAVENOUS

## 2018-09-13 MED ORDER — IBUPROFEN 800 MG PO TABS
800.0000 mg | ORAL_TABLET | Freq: Three times a day (TID) | ORAL | Status: DC
  Administered 2018-09-13 – 2018-09-15 (×7): 800 mg via ORAL
  Filled 2018-09-13 (×8): qty 1

## 2018-09-13 MED ORDER — MORPHINE SULFATE (PF) 0.5 MG/ML IJ SOLN
INTRAMUSCULAR | Status: AC
  Filled 2018-09-13: qty 10

## 2018-09-13 MED ORDER — ZOLPIDEM TARTRATE 5 MG PO TABS: 5.0000 mg | ORAL_TABLET | Freq: Every evening | ORAL | Status: DC | PRN

## 2018-09-13 MED ORDER — SOD CITRATE-CITRIC ACID 500-334 MG/5ML PO SOLN
30.0000 mL | ORAL | Status: AC
  Administered 2018-09-13: 30 mL via ORAL

## 2018-09-13 MED ORDER — DEXAMETHASONE SODIUM PHOSPHATE 10 MG/ML IJ SOLN
INTRAMUSCULAR | Status: AC
  Filled 2018-09-13: qty 1

## 2018-09-13 MED ORDER — NALBUPHINE HCL 10 MG/ML IJ SOLN: 5.0000 mg | Freq: Once | INTRAMUSCULAR | Status: DC | PRN

## 2018-09-13 MED ORDER — SIMETHICONE 80 MG PO CHEW: 80.0000 mg | CHEWABLE_TABLET | ORAL | Status: DC | PRN

## 2018-09-13 MED ORDER — KETOROLAC TROMETHAMINE 30 MG/ML IJ SOLN: 30.0000 mg | Freq: Four times a day (QID) | INTRAMUSCULAR | Status: AC | PRN

## 2018-09-13 MED ORDER — DIPHENHYDRAMINE HCL 25 MG PO CAPS: 25.0000 mg | ORAL_CAPSULE | ORAL | Status: DC | PRN

## 2018-09-13 MED ORDER — SIMETHICONE 80 MG PO CHEW
80.0000 mg | CHEWABLE_TABLET | Freq: Three times a day (TID) | ORAL | Status: DC
  Administered 2018-09-13 – 2018-09-15 (×6): 80 mg via ORAL
  Filled 2018-09-13 (×7): qty 1

## 2018-09-13 MED ORDER — SCOPOLAMINE 1 MG/3DAYS TD PT72
1.0000 | MEDICATED_PATCH | Freq: Once | TRANSDERMAL | Status: DC
  Filled 2018-09-13: qty 1

## 2018-09-13 MED ORDER — ONDANSETRON HCL 4 MG/2ML IJ SOLN
INTRAMUSCULAR | Status: AC
  Filled 2018-09-13: qty 2

## 2018-09-13 MED ORDER — LACTATED RINGERS IV SOLN: 125.0000 mL/h | INTRAVENOUS | Status: DC

## 2018-09-13 MED ORDER — SENNOSIDES-DOCUSATE SODIUM 8.6-50 MG PO TABS
2.0000 | ORAL_TABLET | ORAL | Status: DC
  Administered 2018-09-13 – 2018-09-14 (×2): 2 via ORAL
  Filled 2018-09-13 (×2): qty 2

## 2018-09-13 MED ORDER — WITCH HAZEL-GLYCERIN EX PADS: 1.0000 "application " | MEDICATED_PAD | CUTANEOUS | Status: DC | PRN

## 2018-09-13 MED ORDER — MENTHOL 3 MG MT LOZG: 1.0000 | LOZENGE | OROMUCOSAL | Status: DC | PRN

## 2018-09-13 MED ORDER — NALOXONE HCL 4 MG/10ML IJ SOLN
1.0000 ug/kg/h | INTRAVENOUS | Status: DC | PRN
  Filled 2018-09-13: qty 5

## 2018-09-13 MED ORDER — MORPHINE SULFATE (PF) 0.5 MG/ML IJ SOLN
INTRAMUSCULAR | Status: DC | PRN
  Administered 2018-09-13: .15 mg via INTRATHECAL

## 2018-09-13 MED ORDER — PHENYLEPHRINE 8 MG IN D5W 100 ML (0.08MG/ML) PREMIX OPTIME
INJECTION | INTRAVENOUS | Status: DC | PRN
  Administered 2018-09-13: 60 ug/min via INTRAVENOUS

## 2018-09-13 MED ORDER — COCONUT OIL OIL: 1.0000 "application " | TOPICAL_OIL | Status: DC | PRN

## 2018-09-13 MED ORDER — HYDROMORPHONE HCL 1 MG/ML IJ SOLN: 0.2500 mg | INTRAMUSCULAR | Status: DC | PRN

## 2018-09-13 MED ORDER — MEASLES, MUMPS & RUBELLA VAC IJ SOLR
0.5000 mL | Freq: Once | INTRAMUSCULAR | Status: DC
  Filled 2018-09-13: qty 0.5

## 2018-09-13 SURGICAL SUPPLY — 36 items
BENZOIN TINCTURE PRP APPL 2/3 (GAUZE/BANDAGES/DRESSINGS) ×3 IMPLANT
CHLORAPREP W/TINT 26ML (MISCELLANEOUS) ×3 IMPLANT
CLAMP CORD UMBIL (MISCELLANEOUS) IMPLANT
CLOSURE STERI STRIP 1/2 X4 (GAUZE/BANDAGES/DRESSINGS) ×2 IMPLANT
CLOSURE WOUND 1/2 X4 (GAUZE/BANDAGES/DRESSINGS) ×1
CLOTH BEACON ORANGE TIMEOUT ST (SAFETY) ×3 IMPLANT
DRSG OPSITE POSTOP 4X10 (GAUZE/BANDAGES/DRESSINGS) ×3 IMPLANT
ELECT REM PT RETURN 9FT ADLT (ELECTROSURGICAL) ×3
ELECTRODE REM PT RTRN 9FT ADLT (ELECTROSURGICAL) ×1 IMPLANT
EXTRACTOR VACUUM M CUP 4 TUBE (SUCTIONS) IMPLANT
EXTRACTOR VACUUM M CUP 4' TUBE (SUCTIONS)
GLOVE BIOGEL PI IND STRL 7.0 (GLOVE) ×2 IMPLANT
GLOVE BIOGEL PI INDICATOR 7.0 (GLOVE) ×4
GLOVE ECLIPSE 7.0 STRL STRAW (GLOVE) ×6 IMPLANT
GOWN STRL REUS W/TWL LRG LVL3 (GOWN DISPOSABLE) ×6 IMPLANT
HOVERMATT SINGLE USE (MISCELLANEOUS) ×3 IMPLANT
KIT ABG SYR 3ML LUER SLIP (SYRINGE) IMPLANT
NEEDLE HYPO 22GX1.5 SAFETY (NEEDLE) ×3 IMPLANT
NEEDLE HYPO 25X5/8 SAFETYGLIDE (NEEDLE) IMPLANT
NS IRRIG 1000ML POUR BTL (IV SOLUTION) ×3 IMPLANT
PACK C SECTION WH (CUSTOM PROCEDURE TRAY) ×3 IMPLANT
PAD ABD 7.5X8 STRL (GAUZE/BANDAGES/DRESSINGS) ×3 IMPLANT
PAD ABD 8X10 STRL (GAUZE/BANDAGES/DRESSINGS) ×3 IMPLANT
PAD OB MATERNITY 4.3X12.25 (PERSONAL CARE ITEMS) ×3 IMPLANT
PENCIL SMOKE EVAC W/HOLSTER (ELECTROSURGICAL) ×3 IMPLANT
RTRCTR C-SECT PINK 25CM LRG (MISCELLANEOUS) ×3 IMPLANT
SPONGE GAUZE 4X4 12PLY STER LF (GAUZE/BANDAGES/DRESSINGS) ×6 IMPLANT
STRIP CLOSURE SKIN 1/2X4 (GAUZE/BANDAGES/DRESSINGS) ×2 IMPLANT
SUT VIC AB 0 CTX 36 (SUTURE) ×6
SUT VIC AB 0 CTX36XBRD ANBCTRL (SUTURE) ×3 IMPLANT
SUT VIC AB 4-0 KS 27 (SUTURE) ×3 IMPLANT
SYR 30ML LL (SYRINGE) ×3 IMPLANT
TAPE CLOTH SURG 4X10 WHT LF (GAUZE/BANDAGES/DRESSINGS) ×3 IMPLANT
TOWEL OR 17X24 6PK STRL BLUE (TOWEL DISPOSABLE) ×3 IMPLANT
TRAY FOLEY W/BAG SLVR 14FR LF (SET/KITS/TRAYS/PACK) ×3 IMPLANT
WATER STERILE IRR 1000ML POUR (IV SOLUTION) ×3 IMPLANT

## 2018-09-13 NOTE — MAU Note (Signed)
Leaking clear fld since 2315. Occ mild ctxs. Was for IOL Sat am at 0730. 0.5cm last sve. Last Lovenox inj THurs night.

## 2018-09-13 NOTE — Lactation Note (Signed)
This note was copied from a baby's chart. Lactation Consultation Note  Patient Name: Stephanie Johns [id number] Date: 09/13/2018 Reason for consult: Initial assessment;Primapara;1st time breastfeeding;Term  9 hours old FT female who is now being partially BF and formula fed by his mother, she's a P1. Mom voiced she's been having difficulty to latch baby on, she has flat nipples, but her RNs have been proactive and have set her up with a DEBP, breast shells and a NS # 20. However mom only tried the NS once, and she voiced that baby did latch on but "nothing came out". So she's been just doing Similac since then. Parents aware that volumes required for supplementation the first 24 hours are very small, reviewed supplementation chart. Mom has a Motif DEBP at home.  Parents already syringe feeding baby Similac 20 calorie formula when entering the room. Offered assistance with latch but baby wasn't ready to eat, per dad he "only" took 7 ml and he gave it to him because he was cueing. Reviewed hand expression with mom and she was able to get a small droplet of colostrum, LC rubbed it to baby's mouth and show parents how to do finger feeding. Mom has already pumped twice but "nothing came out". Explained to mom that the purpose of pumping early on was mainly for breast stimulation and not to get volume. Mom voiced understanding. Discussed normal newborn behavior, cluster feeding and baby's sleeping cycle.  Feeding plan  1. Encouraged mom to feed baby to the breast STS with a NS # 20 8-12 times/24 hours or sooner if cues are present 2. Asked mom to call if she wanted to work on the latch with the NS #20, she's only tried once 3. Mom will pump every 3 hours and at least once at night and will feed baby any amount of EBM she may get 4. Parents will keep supplementing baby with Similac 20 calorie formula en the meantime while working on BF. 5. Mom will start wearing her breast shells tomorrow once she gets a bra  to the hospital  BF brochure, BF resources and feeding diary were reviewed. Parents reported all questions and concerns were answered, they're both aware of LC services and will call PRN.   Maternal Data Formula Feeding for Exclusion: No Has patient been taught Hand Expression?: Yes Does the patient have breastfeeding experience prior to this delivery?: No  Feeding Feeding Type: Formula  Interventions Interventions: Breast feeding basics reviewed;Breast massage;Hand express;Breast compression;Shells;DEBP  Lactation Tools Discussed/Used Tools: Other (comment)(curved tip syringe) WIC Program: No Pump Review: Setup, frequency, and cleaning Initiated by:: RN Date initiated:: 09/13/18   Consult Status Consult Status: Follow-up Date: 09/14/18 Follow-up type: In-patient    Zaid Tomes Venetia Constable 09/13/2018, 1:39 PM

## 2018-09-13 NOTE — Anesthesia Preprocedure Evaluation (Addendum)
Anesthesia Evaluation  Patient identified by MRN, date of birth, ID band Patient awake    Reviewed: Allergy & Precautions, NPO status , Patient's Chart, lab work & pertinent test results  Airway Mallampati: II  TM Distance: >3 FB Neck ROM: Full    Dental no notable dental hx. (+) Teeth Intact   Pulmonary neg pulmonary ROS,    Pulmonary exam normal breath sounds clear to auscultation       Cardiovascular Exercise Tolerance: Good + DVT  Normal cardiovascular exam Rhythm:Regular Rate:Normal  Hx of DVT  On Enoxaparin last dose 09/11/2018 PM   Neuro/Psych negative neurological ROS     GI/Hepatic negative GI ROS, Neg liver ROS,   Endo/Other  negative endocrine ROS  Renal/GU negative Renal ROS     Musculoskeletal   Abdominal   Peds  Hematology Hgb 11.0 Plts 214   Anesthesia Other Findings   Reproductive/Obstetrics (+) Pregnancy                             Anesthesia Physical Anesthesia Plan  ASA: II  Anesthesia Plan: Spinal   Post-op Pain Management:    Induction:   PONV Risk Score and Plan: Treatment may vary due to age or medical condition, Ondansetron and Dexamethasone  Airway Management Planned: Nasal Cannula and Natural Airway  Additional Equipment:   Intra-op Plan:   Post-operative Plan:   Informed Consent: I have reviewed the patients History and Physical, chart, labs and discussed the procedure including the risks, benefits and alternatives for the proposed anesthesia with the patient or authorized representative who has indicated his/her understanding and acceptance.       Plan Discussed with:   Anesthesia Plan Comments: (41 wks for c/s for Breech)        Anesthesia Quick Evaluation

## 2018-09-13 NOTE — H&P (Signed)
Obstetric Preoperative History and Physical  Stephanie Johns is a 23 y.o. G2P0010 with IUP at 114w0d presenting for SROM. ROM occurred at 2315 with light meconium stained fluid. Patient contracting more frequently now, reports every 3 minutes. Infant noted to be breech presentation by bedside ultrasound performed in MAU. Will proceed with C-section as method of delivery.   Prenatal Course Source of Care: FT  with onset of care at 11 weeks Pregnancy complications or risks: Patient Active Problem List   Diagnosis Date Noted  . Post-dates pregnancy 09/13/2018  . Asymptomatic bacteriuria during pregnancy in first trimester 02/24/2018  . Supervision of normal pregnancy 02/19/2018  . Dysmenorrhea 09/14/2016  . History of DVT (deep vein thrombosis) 09/14/2016  . Acute deep vein thrombosis (DVT) of distal vein of left lower extremity (HCC) 02/02/2016   She plans to breastfeed She desires undecided for postpartum contraception.   Prenatal labs and studies: ABO, Rh: --/--/A POS (02/01 0018) Antibody: NEG (02/01 0018) Rubella: 4.45 (07/10 1541) RPR: Non Reactive (11/01 0919)  HBsAg: Negative (07/10 1541)  HIV: Non Reactive (11/01 0919)  ONG:EXBMWUXLGBS:Positive (12/31 1222) 2 hr Glucola: Normal  Genetic screening normal Anatomy US normal with two LVEICF, bilateral pyelectasis on 8/30 resolved on 12/2   Prenatal Transfer Tool  Maternal Diabetes: No Genetic Screening: Normal Maternal Ultrasounds/Referrals: Normal Fetal Ultrasounds or other Referrals:  None Maternal Substance Abuse:  No Significant Maternal Medications:  Meds include: Other:  Lovenox  Significant Maternal Lab Results: None  Past Medical History:  Diagnosis Date  . Anxiety   . High cholesterol   . History of DVT of lower extremity   . Vitamin D deficiency     Past Surgical History:  Procedure Laterality Date  . NO PAST SURGERIES      OB History  Gravida Para Term Preterm AB Living  2       1    SAB TAB Ectopic Multiple  Live Births  1            # Outcome Date GA Lbr Len/2nd Weight Sex Delivery Anes PTL Lv  2 Current           1 SAB             Social History   Socioeconomic History  . Marital status: Single    Spouse name: Not on file  . Number of children: Not on file  . Years of education: Not on file  . Highest education level: Not on file  Occupational History  . Not on file  Social Needs  . Financial resource strain: Not hard at all  . Food insecurity:    Worry: Never true    Inability: Never true  . Transportation needs:    Medical: No    Non-medical: Not on file  Tobacco Use  . Smoking status: Never Smoker  . Smokeless tobacco: Never Used  Substance and Sexual Activity  . Alcohol use: Not Currently    Comment: rarely  . Drug use: No  . Sexual activity: Yes    Birth control/protection: None  Lifestyle  . Physical activity:    Days per week: Not on file    Minutes per session: Not on file  . Stress: Not at all  Relationships  . Social connections:    Talks on phone: Not on file    Gets together: Not on file    Attends religious service: Not on file    Active member of club or organization: Not on  file    Attends meetings of clubs or organizations: Not on file    Relationship status: Not on file  Other Topics Concern  . Not on file  Social History Narrative  . Not on file    Family History  Problem Relation Age of Onset  . Hyperlipidemia Maternal Grandmother   . Hyperlipidemia Maternal Grandfather   . Hypertension Maternal Grandfather   . Heart attack Maternal Grandfather   . Diabetes Maternal Grandfather   . Rheum arthritis Mother   . Other Mother        fatty liver  . Hypertension Mother     Medications Prior to Admission  Medication Sig Dispense Refill Last Dose  . enoxaparin (LOVENOX) 80 MG/0.8ML injection Inject 0.8 mLs (80 mg total) into the skin daily. 90 Syringe 3 Past Week at Unknown time  . Prenatal Vit-Fe Fumarate-FA (PREPLUS) 27-1 MG TABS TAKE 1  TABLET BY MOUTH EVERY DAY 30 tablet 12 Past Week at Unknown time  . promethazine (PHENERGAN) 12.5 MG tablet Take 1 tablet (12.5 mg total) by mouth every 6 (six) hours as needed for nausea or vomiting. 30 tablet 0 Unknown at Unknown time    No Known Allergies  Review of Systems: Negative except for what is mentioned in HPI.  Physical Exam: BP 129/85 (BP Location: Right Arm)   Pulse (!) 105   Temp 98.1 F (36.7 C) (Oral)   Ht 5\' 6"  (1.676 m)   Wt 97.1 kg   LMP 11/30/2017 (Exact Date)   BMI 34.54 kg/m   CONSTITUTIONAL: Well-developed, well-nourished female in no acute distress.  HENT:  Normocephalic, atraumatic. Oropharynx is clear and moist EYES: Conjunctivae and EOM are normal. No scleral icterus.  NECK: Normal range of motion, supple SKIN: Skin is warm and dry. No rash noted. Not diaphoretic. No erythema. NEUROLGIC: Alert and oriented to person, place, and time. Normal reflexes, muscle tone coordination. No cranial nerve deficit noted. PSYCHIATRIC: Normal mood and affect. Normal behavior. CARDIOVASCULAR: Normal heart rate noted, regular rhythm RESPIRATORY: Effort and breath sounds normal, no problems with respiration noted ABDOMEN: Soft, nontender, nondistended, gravid.  PELVIC: Deferred MUSCULOSKELETAL: Normal range of motion. No edema and no tenderness. 2+ distal pulses.  FHT: 150 bpm, moderate variability, +acels, no decels  Toco: Irregular, q2-8 min   Pertinent Labs/Studies:   Results for orders placed or performed during the hospital encounter of 09/13/18 (from the past 72 hour(s))  Comprehensive metabolic panel     Status: Abnormal   Collection Time: 09/13/18 12:18 AM  Result Value Ref Range   Sodium 133 (L) 135 - 145 mmol/L   Potassium 3.9 3.5 - 5.1 mmol/L   Chloride 105 98 - 111 mmol/L   CO2 19 (L) 22 - 32 mmol/L   Glucose, Bld 107 (H) 70 - 99 mg/dL   BUN 12 6 - 20 mg/dL   Creatinine, Ser 4.49 0.44 - 1.00 mg/dL   Calcium 8.5 (L) 8.9 - 10.3 mg/dL   Total Protein  6.6 6.5 - 8.1 g/dL   Albumin 3.3 (L) 3.5 - 5.0 g/dL   AST 31 15 - 41 U/L   ALT 22 0 - 44 U/L   Alkaline Phosphatase 117 38 - 126 U/L   Total Bilirubin 0.5 0.3 - 1.2 mg/dL   GFR calc non Af Amer >60 >60 mL/min   GFR calc Af Amer >60 >60 mL/min   Anion gap 9 5 - 15    Comment: Performed at [facility], [address] [place],  [place] [address]  CBC     Status: Abnormal   Collection Time: 09/13/18 12:18 AM  Result Value Ref Range   WBC 11.4 (H) 4.0 - 10.5 K/uL   RBC 3.60 (L) 3.87 - 5.11 MIL/uL   Hemoglobin 11.0 (L) 12.0 - 15.0 g/dL   HCT 80.0 (L) 34.9 - 17.9 %   MCV 92.8 80.0 - 100.0 fL   MCH 30.6 26.0 - 34.0 pg   MCHC 32.9 30.0 - 36.0 g/dL   RDW 15.0 56.9 - 79.4 %   Platelets 215 150 - 400 K/uL   nRBC 0.0 0.0 - 0.2 %    Comment: Performed at [facility], [address] [place], [place] [address]  Type and screen     Status: None   Collection Time: 09/13/18 12:18 AM  Result Value Ref Range   ABO/RH(D) A POS    Antibody Screen NEG    Sample Expiration      09/16/2018 Performed at [facility], [address], [place], [place] [address]     Assessment and Plan :MOUNA LAHR is a 23 y.o. G2P0010 at [redacted]w[redacted]d being admitted for cesarean section due to fetal malpresentation and SROM. The risks of cesarean section discussed with the patient included but were not limited to: bleeding which may require transfusion or reoperation; infection which may require antibiotics; injury to bowel, bladder, ureters or other surrounding organs; injury to the fetus; need for additional procedures including hysterectomy in the event of a life-threatening hemorrhage; placental abnormalities wth subsequent pregnancies, incisional problems, thromboembolic phenomenon and other postoperative/anesthesia complications. The patient concurred with the proposed plan, giving informed written consent for the procedure. Patient has been NPO since last night at 18:30 she will remain NPO for procedure.  Anesthesia and OR aware. Preoperative prophylactic antibiotics, including Azithromycin due to ROM, and SCDs ordered on call to the OR. To OR when ready. Patient with h/o DVT on Lovenox 80 mg daily. Will plan to resume 24h post-op.    Marcy Siren, D.O. OB Fellow  09/13/2018, 2:19 AM

## 2018-09-13 NOTE — MAU Note (Addendum)
Dr Richardson LandryHouser (anesthesiologist) notified of pt's admission and status.

## 2018-09-13 NOTE — Progress Notes (Signed)
MOB was referred for history of anxiety. * Referral screened out by Clinical Social Worker because none of the following criteria appear to apply: ~ History of anxiety during this pregnancy, or of post-partum depression following prior delivery. Per OB records review, no concerns of anxiety noted. ~ Diagnosis of anxiety and/or depression within last 3 years. Per chart review, MOB's anxiety dates back to 2016.  OR * MOB's symptoms currently being treated with medication and/or therapy. Please contact the Clinical Social Worker if needs arise, by [facility], or if MOB scores greater than 9/yes to question 10 on Edinburgh Postpartum Depression Screen.  Celso SickleKimberly Burnard Enis, LCSWA Clinical Social Worker [facility]  Cell#: [phone]

## 2018-09-13 NOTE — Anesthesia Procedure Notes (Signed)
Spinal  Patient location during procedure: OB Start time: 09/13/2018 3:24 AM End time: 09/13/2018 3:28 AM Staffing Anesthesiologist: Trevor Iha, MD Performed: anesthesiologist  Preanesthetic Checklist Completed: patient identified, surgical consent, pre-op evaluation, timeout performed, IV checked, risks and benefits discussed and monitors and equipment checked Spinal Block Patient position: sitting Prep: site prepped and draped and DuraPrep Patient monitoring: heart rate, cardiac monitor, continuous pulse ox and blood pressure Approach: midline Location: L3-4 Injection technique: single-shot Needle Needle type: Pencan  Needle gauge: 24 G Needle length: 10 cm Needle insertion depth: 6 cm Assessment Sensory level: T4 Additional Notes 1 Attempt (s). Pt tolerated procedure well.

## 2018-09-13 NOTE — Anesthesia Postprocedure Evaluation (Signed)
Anesthesia Post Note  Patient: Stephanie Johns  Procedure(s) Performed: CESAREAN SECTION (N/A )     Patient location during evaluation: Mother Baby Anesthesia Type: Spinal Level of consciousness: awake and alert Pain management: pain level controlled Vital Signs Assessment: post-procedure vital signs reviewed and stable Respiratory status: spontaneous breathing, nonlabored ventilation and respiratory function stable Cardiovascular status: stable Postop Assessment: no headache, no backache, no apparent nausea or vomiting, patient able to bend at knees, able to ambulate, spinal receding and adequate PO intake Anesthetic complications: no    Last Vitals:  Vitals:   09/13/18 0615 09/13/18 0633  BP:  125/65  Pulse:  68  Resp: 17 18  Temp:  36.9 C  SpO2: 99% 99%    Last Pain:  Vitals:   09/13/18 0656  TempSrc:   PainSc: 0-No pain   Pain Goal: Patients Stated Pain Goal: 0 (09/13/18 0024)                 Laban Emperor

## 2018-09-13 NOTE — Transfer of Care (Signed)
Immediate Anesthesia Transfer of Care Note  Patient: Stephanie Johns  Procedure(s) Performed: CESAREAN SECTION (N/A )  Patient Location: PACU  Anesthesia Type:Spinal  Level of Consciousness: awake, alert  and oriented  Airway & Oxygen Therapy: Patient Spontanous Breathing  Post-op Assessment: Report given to RN and Post -op Vital signs reviewed and stable  Post vital signs: Reviewed and stable  Last Vitals:  Vitals Value Taken Time  BP 112/57 09/13/2018  4:45 AM  Temp    Pulse 84 09/13/2018  4:47 AM  Resp 21 09/13/2018  4:47 AM  SpO2 99 % 09/13/2018  4:47 AM  Vitals shown include unvalidated device data.  Last Pain:  Vitals:   09/13/18 0040  TempSrc: Oral  PainSc:       Patients Stated Pain Goal: 0 (09/13/18 0024)  Complications: No apparent anesthesia complications

## 2018-09-13 NOTE — Op Note (Signed)
Cesarean Section Operative Report  PATIENT: Stephanie MadeiraKristen N Kines  PROCEDURE DATE: 09/13/2018  PREOPERATIVE DIAGNOSES: Intrauterine pregnancy at 3767w0d weeks gestation; malpresentation: breech  POSTOPERATIVE DIAGNOSES: The same  PROCEDURE: Primary Low Transverse Cesarean Section  SURGEON:   Surgeon(s) and Role:    Reva Bores* Pratt, Tanya S, MD - Primary    * Arvilla MarketWallace, Rheba Diamond Lauren, DO - Fellow   INDICATIONS: Stephanie Johns is a 23 y.o. G2P0010 at 6567w0d here for cesarean section secondary to the indications listed under preoperative diagnoses; please see preoperative note for further details.  The risks of cesarean section were discussed with the patient including but were not limited to: bleeding which may require transfusion or reoperation; infection which may require antibiotics; injury to bowel, bladder, ureters or other surrounding organs; injury to the fetus; need for additional procedures including hysterectomy in the event of a life-threatening hemorrhage; placental abnormalities wth subsequent pregnancies, incisional problems, thromboembolic phenomenon and other postoperative/anesthesia complications.   The patient concurred with the proposed plan, giving informed written consent for the procedure.    FINDINGS:  Viable female infant in frank breech presentation. Nuchal cord x1. Apgars 8 and 9.  Weight 3600 g. Clear amniotic fluid.  Intact placenta, three vessel cord.  Normal uterus, fallopian tubes and ovaries bilaterally. Uterus appears to be bicornuate.   ANESTHESIA: Spinal INTRAVENOUS FLUIDS: 1400 mL  ESTIMATED BLOOD LOSS: 444 mL URINE OUTPUT:  400 ml SPECIMENS: Placenta sent to L&D COMPLICATIONS: None immediate  PROCEDURE IN DETAIL:  The patient preoperatively received intravenous antibiotics and had sequential compression devices applied to her lower extremities.  She was then taken to the operating room where spinal anesthesia was administered and was found to be adequate. She was then  placed in a dorsal supine position with a leftward tilt, and prepped and draped in a sterile manner.  A foley catheter was placed into her bladder and attached to constant gravity.    After an adequate timeout was performed, a Pfannenstiel skin incision was made with scalpel and carried through to the underlying layer of fascia. The fascia was incised in the midline, and this incision was extended bilaterally using the Mayo scissors.  Kocher clamps were applied to the superior aspect of the fascial incision and the underlying rectus muscles were dissected off bluntly.  A similar process was carried out on the inferior aspect of the fascial incision. The rectus muscles were separated in the midline bluntly and the peritoneum was entered bluntly. Attention was turned to the lower uterine segment where a low transverse hysterotomy was made with a scalpel and extended bilaterally bluntly.  The infant was successfully delivered in frank breech presentation, nuchal cord reduced, the cord was clamped and cut after one minute, and the infant was handed over to the awaiting neonatology team. Uterine massage was then administered, and the placenta delivered intact with a three-vessel cord. The uterus was then cleared of clots and debris.  The hysterotomy was closed with 0 Vicryl in a running locked fashion, and an imbricating layer was also placed with 0 Vicryl.  The pelvis was cleared of all clot and debris. Hemostasis was confirmed on all surfaces.  The peritoneum was closed with a 0 Vicryl pursestring stitch. The fascia was then closed using 0 Vicryl in a running fashion.  The subcutaneous layer was irrigated, then reapproximated with 2-0 plain gut running stitches. The skin was closed with a 4-0 Vicryl subcuticular stitch. 30 ml of 0.5% Marcaine was injected around the incision.  The patient  tolerated the procedure well. Sponge, lap, instrument and needle counts were correct x 3.  She was taken to the recovery room in  stable condition.   An experienced assistant was required given the standard of surgical care given the complexity of the case.  This assistant was needed for exposure, dissection, suctioning, retraction, instrument exchange, assisting with delivery with administration of fundal pressure, and for overall help during the procedure.   Maternal Disposition: PACU - hemodynamically stable.   Infant Disposition: stable   Marcy Siren, D.O. OB Fellow  09/13/2018, 7:21 AM

## 2018-09-14 MED ORDER — BISACODYL 10 MG RE SUPP
10.0000 mg | Freq: Every day | RECTAL | Status: DC | PRN
  Administered 2018-09-14: 10 mg via RECTAL
  Filled 2018-09-14: qty 1

## 2018-09-14 NOTE — Progress Notes (Signed)
Subjective: Postpartum Day 1: Cesarean Delivery Patient reports some incisional pain. Has been trying to get up and move around a little bit. Going to the bathroom okay. About to get up and shower and try to remove pressure dressing.   Objective: Vital signs in last 24 hours: Temp:  [97.7 F (36.5 C)-98.6 F (37 C)] 98.1 F (36.7 C) (02/02 0511) Pulse Rate:  [73-114] 99 (02/02 0511) Resp:  [15-18] 16 (02/02 0511) BP: (102-121)/(56-67) 109/65 (02/02 0511) SpO2:  [95 %-99 %] 99 % (02/02 0511)  Physical Exam:  General: alert, well-appearing, NAD Lochia: appropriate Uterine Fundus: firm Incision: pressure dressing still in place, C/D/I DVT Evaluation: No significant calf/ankle edema.  Recent Labs    09/13/18 0018 09/13/18 0936  HGB 11.0* 10.2*  HCT 33.4* 30.2*    Assessment/Plan: Status post Cesarean section. Doing well postoperatively.  Continue current care. Completed short-term disability paperwork. HgB drop appropriate.   Tamera Stands 09/14/2018, 11:58 AM

## 2018-09-15 ENCOUNTER — Encounter (HOSPITAL_COMMUNITY): Payer: Self-pay | Admitting: *Deleted

## 2018-09-15 MED ORDER — IBUPROFEN 800 MG PO TABS: 800.0000 mg | ORAL_TABLET | Freq: Three times a day (TID) | ORAL | 0 refills | Status: DC

## 2018-09-15 MED ORDER — OXYCODONE HCL 5 MG PO TABS: 5.0000 mg | ORAL_TABLET | Freq: Four times a day (QID) | ORAL | 0 refills | Status: DC | PRN

## 2018-09-15 NOTE — Progress Notes (Signed)
Subjective: Postpartum Day 2: Cesarean Delivery Patient reports tolerating PO, + flatus, + BM and no problems voiding. She requests to go home today.  Objective: Vital signs in last 24 hours: Temp:  [97.8 F (36.6 C)-98.2 F (36.8 C)] 97.8 F (36.6 C) (02/03 0529) Pulse Rate:  [72-85] 72 (02/03 0529) Resp:  [16-20] 16 (02/03 0529) BP: (111-118)/(68-77) 111/68 (02/03 0529) SpO2:  [99 %-100 %] 99 % (02/03 0529)  Physical Exam:  General: alert, cooperative and no distress Lochia: appropriate Uterine Fundus: firm, midline, 3-U Incision: healing well, no significant drainage, no dehiscence, no significant erythema, HC dressing C/D/I DVT Evaluation: No evidence of DVT seen on physical exam. Negative Homan's sign. No cords or calf tenderness. No significant calf/ankle edema.  Recent Labs    09/13/18 0018 09/13/18 0936  HGB 11.0* 10.2*  HCT 33.4* 30.2*    Assessment/Plan: Status post Cesarean section. Doing well postoperatively.  Discharge home with standard precautions and return to clinic in 4-6 weeks.  Raelyn Mora, CNM 09/15/2018, 7:34 AM

## 2018-09-15 NOTE — Discharge Summary (Signed)
Postpartum Discharge Summary     Patient Name: Stephanie MadeiraKristen N Johns DOB: 08/08/1996 MRN: [id number]  Date of admission: 09/13/2018 Delivering Provider: Reva BoresPRATT, TANYA S   Date of discharge: 09/15/2018  Admitting diagnosis: 41 wks water broke and ctx Intrauterine pregnancy: 8246w2d     Secondary diagnosis:  Active Problems:   Post-dates pregnancy   Cesarean delivery delivered  Additional problems: none     Discharge diagnosis: Term Pregnancy Delivered                                                                                                Post partum procedures:Lovenox  Augmentation: none  Complications: None  Hospital course:  Sceduled C/S   23 y.o. yo G2P0010 at 8546w2d was admitted to the hospital 09/13/2018 for scheduled cesarean section with the following indication:Malpresentation.  Membrane Rupture Time/Date: 11:15 PM ,09/12/2018   Patient delivered a Viable infant.09/13/2018  Details of operation can be found in separate operative note.  Pateint had an uncomplicated postpartum course.  She is ambulating, tolerating a regular diet, passing flatus, and urinating well. Patient is discharged home in stable condition on  09/15/18         Magnesium Sulfate recieved: No BMZ received: No  Physical exam  Vitals:   09/14/18 0511 09/14/18 1629 09/14/18 2012 09/15/18 0529  BP: 109/65 117/77 118/72 111/68  Pulse: 99 85 79 72  Resp: 16 20 18 16   Temp: 98.1 F (36.7 C) 97.8 F (36.6 C) 98.2 F (36.8 C) 97.8 F (36.6 C)  TempSrc: Oral  Oral Oral  SpO2: 99%  100% 99%  Weight:      Height:       General: alert, cooperative and no distress Lochia: appropriate Uterine Fundus: firm, midline, U-2 Incision: Healing well with no significant drainage, No significant erythema, Dressing is clean, dry, and intact DVT Evaluation: No evidence of DVT seen on physical exam. Negative Homan's sign. No cords or calf tenderness. No significant calf/ankle edema. Labs: Lab Results  Component  Value Date   WBC 18.7 (H) 09/13/2018   HGB 10.2 (L) 09/13/2018   HCT 30.2 (L) 09/13/2018   MCV 91.8 09/13/2018   PLT 175 09/13/2018   CMP Latest Ref Rng & Units 09/13/2018  Glucose 70 - 99 mg/dL 010(U107(H)  BUN 6 - 20 mg/dL 12  Creatinine 7.250.44 - 3.661.00 mg/dL 4.400.52  Sodium 347135 - 425145 mmol/L 133(L)  Potassium 3.5 - 5.1 mmol/L 3.9  Chloride 98 - 111 mmol/L 105  CO2 22 - 32 mmol/L 19(L)  Calcium 8.9 - 10.3 mg/dL 9.5(G8.5(L)  Total Protein 6.5 - 8.1 g/dL 6.6  Total Bilirubin 0.3 - 1.2 mg/dL 0.5  Alkaline Phos 38 - 126 U/L 117  AST 15 - 41 U/L 31  ALT 0 - 44 U/L 22    Discharge instruction: per After Visit Summary and "Baby and Me Booklet".  After visit meds:  Allergies as of 09/15/2018   No Known Allergies     Medication List    STOP taking these medications   acetaminophen 500 MG tablet Commonly known as:  TYLENOL  TAKE these medications   enoxaparin 80 MG/0.8ML injection Commonly known as:  LOVENOX Inject 0.8 mLs (80 mg total) into the skin daily.   ibuprofen 800 MG tablet Commonly known as:  ADVIL,MOTRIN Take 1 tablet (800 mg total) by mouth every 8 (eight) hours.   oxyCODONE 5 MG immediate release tablet Commonly known as:  Oxy IR/ROXICODONE Take 1-2 tablets (5-10 mg total) by mouth every 6 (six) hours as needed for moderate pain.   PREPLUS 27-1 MG Tabs TAKE 1 TABLET BY MOUTH EVERY DAY       Diet: routine diet  Activity: Advance as tolerated. Pelvic rest for 6 weeks.   Outpatient follow up:2 weeks for incision check Follow up Appt:No future appointments. Follow up Visit: Follow-up Information    [facility]. Schedule an appointment as soon as possible for a visit in 1 week(s).   Why:  For incision check Contact information: [address] [place] [phone]           Please schedule this patient for Postpartum visit in: 6 weeks with the following provider: Any provider For C/S patients schedule nurse incision  check in weeks 2 weeks: yes Low risk pregnancy complicated by: Bleeding disorder on Lovenox Delivery mode:  CS Anticipated Birth Control:  Nexplanon PP Procedures needed: Incision check in 2 wks Schedule Integrated BH visit: no    Newborn Data: Live born female "Vickki MuffWeston" Birth Weight: 7 lb 15 oz (3600 g) APGAR: 8, 9  Newborn Delivery   Birth date/time:  09/13/2018 03:49:00 Delivery type:  C-Section, Low Vertical Trial of labor:  No C-section categorization:  Primary     Baby Feeding: Bottle Disposition:home with mother   09/15/2018 Raelyn Moraolitta Desarea Ohagan, CNM

## 2018-09-15 NOTE — Lactation Note (Signed)
This note was copied from a baby's chart. Lactation Consultation Note  Patient Name: Stephanie Johns Date: 09/15/2018 Reason for consult: Follow-up assessment;Difficult latch Baby is 94 hours old.  Mom is currently bottle feeding baby with formula.  She states she pumped yesterday but none during the night.  Mom has a pump at home.  Stressed importance of pumping 8-12 times/24 hours to establish and maintain her milk supply.  Discussed milk coming to volume and the prevention and treatment of engorgement.  I offered assist with latching baby to breast but mom declined.  She states she will have to use bottles when returning to work so she is fine with pumping and bottle feeding.  Lactation outpatient services and support reviewed and encouraged prn.  Maternal Data    Feeding Feeding Type: Bottle Fed - Formula  LATCH Score                   Interventions    Lactation Tools Discussed/Used     Consult Status Consult Status: Complete Follow-up type: Call as needed    Fronnie Urton S 09/15/2018, 10:08 AM

## 2018-09-19 ENCOUNTER — Telehealth: Payer: Self-pay | Admitting: *Deleted

## 2018-09-19 NOTE — Telephone Encounter (Signed)
Patient states she is not feeling like herself, just cries, no appetite. Is not having any thoughts of hurting herself or the baby, her mom is just worried about her.  We discussed PPD and any need for her to go the ER but would get her in Monday for an incision check and to discuss possible medications.  Pt verbalized understanding and agreeable to plan.

## 2018-09-22 ENCOUNTER — Encounter: Payer: BLUE CROSS/BLUE SHIELD | Admitting: Advanced Practice Midwife

## 2018-09-23 ENCOUNTER — Encounter: Payer: BLUE CROSS/BLUE SHIELD | Admitting: Obstetrics & Gynecology

## 2018-09-25 ENCOUNTER — Ambulatory Visit (INDEPENDENT_AMBULATORY_CARE_PROVIDER_SITE_OTHER): Payer: BLUE CROSS/BLUE SHIELD | Admitting: Advanced Practice Midwife

## 2018-09-25 ENCOUNTER — Encounter: Payer: Self-pay | Admitting: Advanced Practice Midwife

## 2018-09-25 VITALS — BP 118/80 | HR 72 | Ht 66.0 in | Wt 186.0 lb

## 2018-09-25 DIAGNOSIS — Z9889 Other specified postprocedural states: Secondary | ICD-10-CM | POA: Diagnosis not present

## 2018-09-25 DIAGNOSIS — Z09 Encounter for follow-up examination after completed treatment for conditions other than malignant neoplasm: Secondary | ICD-10-CM

## 2018-09-25 NOTE — Progress Notes (Signed)
[facility] Visit  Patient name: Stephanie Johns MRN [id number]  Date of birth: 1996-01-12  CC & HPI:  Stephanie Johns is a 23 y.o. Caucasian female presenting today for 2 week post op visit. Had PLTCS for breech.  No problems, BMs/pain level normal. Had some baby blues, improving.   Pertinent History Reviewed:  Medical & Surgical Hx:   Past Medical History:  Diagnosis Date  . Anxiety   . High cholesterol   . History of DVT of lower extremity   . Vitamin D deficiency    Past Surgical History:  Procedure Laterality Date  . CESAREAN SECTION N/A 09/13/2018   Procedure: CESAREAN SECTION;  Surgeon: Reva Bores, MD;  Location: [facility];  Service: Obstetrics;  Laterality: N/A;  . NO PAST SURGERIES     Family History  Problem Relation Age of Onset  . Hyperlipidemia Maternal Grandmother   . Hyperlipidemia Maternal Grandfather   . Hypertension Maternal Grandfather   . Heart attack Maternal Grandfather   . Diabetes Maternal Grandfather   . Rheum arthritis Mother   . Other Mother        fatty liver  . Hypertension Mother     Current Outpatient Medications:  .  enoxaparin (LOVENOX) 80 MG/0.8ML injection, Inject 0.8 mLs (80 mg total) into the skin daily., Disp: 90 Syringe, Rfl: 3 .  ibuprofen (ADVIL,MOTRIN) 800 MG tablet, Take 1 tablet (800 mg total) by mouth every 8 (eight) hours., Disp: 30 tablet, Rfl: 0 .  oxyCODONE (OXY IR/ROXICODONE) 5 MG immediate release tablet, Take 1-2 tablets (5-10 mg total) by mouth every 6 (six) hours as needed for moderate pain. (Patient not taking: Reported on 09/25/2018), Disp: 30 tablet, Rfl: 0 .  Prenatal Vit-Fe Fumarate-FA (PREPLUS) 27-1 MG TABS, TAKE 1 TABLET BY MOUTH EVERY DAY (Patient not taking: Reported on 09/25/2018), Disp: 30 tablet, Rfl: 12 Social History: Reviewed -  reports that she has never smoked. She has never used smokeless tobacco.  Review of Systems:   Constitutional: Negative for fever and chills Eyes: Negative  for visual disturbances Respiratory: Negative for shortness of breath, dyspnea Cardiovascular: Negative for chest pain or palpitations  Gastrointestinal: Negative for vomiting, diarrhea and constipation; no abdominal pain Genitourinary: Negative for dysuria and urgency, vaginal irritation or itching Musculoskeletal: Negative for back pain, joint pain, myalgias  Neurological: Negative for dizziness and headaches    Objective Findings:    Physical Examination: Vitals:   09/25/18 1054  BP: 118/80  Pulse: 72   General appearance - well appearing, and in no distress Mental status - alert, oriented to person, place, and time Chest:  Normal respiratory effort Heart - normal rate and regular rhythm Abdomen:  Soft, nontender;p incision healing well Pelvic: deferred Musculoskeletal:  Normal range of motion without pain Extremities:  No edema    No results found for this or any previous visit (from the past 24 hour(s)).    Assessment & Plan:  A:   2 weeks post op CS P:  Has postpartum appt 3/10   Return for after 3/10 for Nexplanon ([employer]). Scarlette Calico Cresenzo-Dishmon CNM 09/25/2018 11:16 AM

## 2018-09-26 ENCOUNTER — Encounter: Payer: BLUE CROSS/BLUE SHIELD | Admitting: Advanced Practice Midwife

## 2018-10-21 ENCOUNTER — Encounter: Payer: Self-pay | Admitting: Women's Health

## 2018-10-21 ENCOUNTER — Other Ambulatory Visit: Payer: Self-pay

## 2018-10-21 ENCOUNTER — Ambulatory Visit (INDEPENDENT_AMBULATORY_CARE_PROVIDER_SITE_OTHER): Payer: BLUE CROSS/BLUE SHIELD | Admitting: Women's Health

## 2018-10-21 DIAGNOSIS — Z86718 Personal history of other venous thrombosis and embolism: Secondary | ICD-10-CM

## 2018-10-21 NOTE — Progress Notes (Signed)
   POSTPARTUM VISIT Patient name: Stephanie Johns MRN [id number]  Date of birth: Jun 14, 1996 Chief Complaint:   Postpartum Care  History of Present Illness:   Stephanie Johns is a 23 y.o. G78P1011 Caucasian female being seen today for a postpartum visit. She is 5 weeks postpartum following a primary cesarean section, low transverse incision at 41.0 gestational weeks d/t ROM/breech. Anesthesia: spinal. Laceration: n/a. I have fully reviewed the prenatal and intrapartum course. Pregnancy complicated by h/o DVT- Lovenox during pregnancy and pp. Postpartum course has been uncomplicated. Bleeding no bleeding. Bowel function is normal. Bladder function is normal.  Patient is not sexually active. Last sexual activity: prior to birth of baby.  Contraception method is scheduled for nexplanon tomorrow.  [place] Postpartum Depression Screening: negative. Score 2.  Had 'baby blues' x 1-2wks, much better now Last pap 05/30/17.  Results were normal .  No LMP recorded.  Baby's course has been complicated by brief febrile illness/hospitalization, now problems w/ formula/reflus. Baby is feeding by bottle.  Review of Systems:   Pertinent items are noted in HPI Denies Abnormal vaginal discharge w/ itching/odor/irritation, headaches, visual changes, shortness of breath, chest pain, abdominal pain, severe nausea/vomiting, or problems with urination or bowel movements. Pertinent History Reviewed:  Reviewed past medical,surgical, obstetrical and family history.  Reviewed problem list, medications and allergies. OB History  Gravida Para Term Preterm AB Living  2 1 1   1 1   SAB TAB Ectopic Multiple Live Births  1       1    # Outcome Date GA Lbr Len/2nd Weight Sex Delivery Anes PTL Lv  2 Term 09/13/18 [redacted]w[redacted]d  7 lb 15 oz (3.6 kg) M CS-LTranv Spinal  LIV     Birth Comments: ROM/breech  1 SAB            Physical Assessment:   Vitals:   10/21/18 1120  BP: 113/77  Pulse: 72  Weight: 195 lb 3.2 oz (88.5 kg)    Height: 5\' 6"  (1.676 m)  Body mass index is 31.51 kg/m.       Physical Examination:   General appearance: alert, well appearing, and in no distress  Mental status: alert, oriented to person, place, and time  Skin: warm & dry   Cardiovascular: normal heart rate noted   Respiratory: normal respiratory effort, no distress   Breasts: deferred, no complaints   Abdomen: soft, non-tender, c/s incision well-healed  Pelvic: VULVA: normal appearing vulva with no masses, tenderness or lesions, UTERUS: uterus is normal size, shape, consistency and nontender  Rectal: no hemorrhoids  Extremities: no edema       No results found for this or any previous visit (from the past 24 hour(s)).  Assessment & Plan:  1) Postpartum exam 2) 5 wks s/p PLTCS d/t ROM/breech 3) Bottlefeeding 4) Depression screening 5) Contraception counseling, pt prefers abstinence until after Nexplanon tomorrow 6) H/O DVT> stop Lovenox @ 6wks pp (3/14)  Meds: No orders of the defined types were placed in this encounter.   Follow-up: Return for As scheduled tomorrow for nexplanon.   No orders of the defined types were placed in this encounter.   Cheral Marker CNM, [facility] 10/21/2018 11:39 AM

## 2018-10-22 ENCOUNTER — Ambulatory Visit: Payer: BLUE CROSS/BLUE SHIELD | Admitting: Advanced Practice Midwife

## 2018-10-22 ENCOUNTER — Encounter: Payer: Self-pay | Admitting: Advanced Practice Midwife

## 2018-10-22 ENCOUNTER — Other Ambulatory Visit: Payer: Self-pay

## 2018-10-22 VITALS — BP 115/72 | HR 77 | Ht 66.0 in | Wt 196.0 lb

## 2018-10-22 DIAGNOSIS — Z30017 Encounter for initial prescription of implantable subdermal contraceptive: Secondary | ICD-10-CM | POA: Insufficient documentation

## 2018-10-22 DIAGNOSIS — Z3202 Encounter for pregnancy test, result negative: Secondary | ICD-10-CM

## 2018-10-22 LAB — POCT URINE PREGNANCY: Preg Test, Ur: NEGATIVE

## 2018-10-22 MED ORDER — ETONOGESTREL 68 MG ~~LOC~~ IMPL
68.0000 mg | DRUG_IMPLANT | Freq: Once | SUBCUTANEOUS | Status: AC
  Administered 2018-10-22: 68 mg via SUBCUTANEOUS

## 2018-10-22 NOTE — Patient Instructions (Addendum)
Nexplanon Instructions After Insertion   Keep bandage clean and dry for 24 hours   May use ice/Tylenol/Ibuprofen for soreness or pain   If you develop fever, drainage or increased warmth from incision site-contact office immediately  Use condoms for 2 weeks     Etonogestrel implant What is this medicine? ETONOGESTREL (et oh noe JES trel) is a contraceptive (birth control) device. It is used to prevent pregnancy. It can be used for up to 3 years. This medicine may be used for other purposes; ask your health care provider or pharmacist if you have questions. COMMON BRAND NAME(S): Implanon, Nexplanon What should I tell my health care provider before I take this medicine? They need to know if you have any of these conditions: -abnormal vaginal bleeding -blood vessel disease or blood clots -breast, cervical, endometrial, ovarian, liver, or uterine cancer -diabetes -gallbladder disease -heart disease or recent heart attack -high blood pressure -high cholesterol or triglycerides -kidney disease -liver disease -migraine headaches -seizures -stroke -tobacco smoker -an unusual or allergic reaction to etonogestrel, anesthetics or antiseptics, other medicines, foods, dyes, or preservatives -pregnant or trying to get pregnant -breast-feeding How should I use this medicine? This device is inserted just under the skin on the inner side of your upper arm by a health care professional. Talk to your pediatrician regarding the use of this medicine in children. Special care may be needed. Overdosage: If you think you have taken too much of this medicine contact a poison control center or emergency room at once. NOTE: This medicine is only for you. Do not share this medicine with others. What if I miss a dose? This does not apply. What may interact with this medicine? Do not take this medicine with any of the following medications: -amprenavir -fosamprenavir This medicine may also  interact with the following medications: -acitretin -aprepitant -armodafinil -bexarotene -bosentan -carbamazepine -certain medicines for fungal infections like fluconazole, ketoconazole, itraconazole and voriconazole -certain medicines to treat hepatitis, HIV or AIDS -cyclosporine -felbamate -griseofulvin -lamotrigine -modafinil -oxcarbazepine -phenobarbital -phenytoin -primidone -rifabutin -rifampin -rifapentine -St. John's wort -topiramate This list may not describe all possible interactions. Give your health care provider a list of all the medicines, herbs, non-prescription drugs, or dietary supplements you use. Also tell them if you smoke, drink alcohol, or use illegal drugs. Some items may interact with your medicine. What should I watch for while using this medicine? This product does not protect you against HIV infection (AIDS) or other sexually transmitted diseases. You should be able to feel the implant by pressing your fingertips over the skin where it was inserted. Contact your doctor if you cannot feel the implant, and use a non-hormonal birth control method (such as condoms) until your doctor confirms that the implant is in place. Contact your doctor if you think that the implant may have broken or become bent while in your arm. You will receive a user card from your health care provider after the implant is inserted. The card is a record of the location of the implant in your upper arm and when it should be removed. Keep this card with your health records. What side effects may I notice from receiving this medicine? Side effects that you should report to your doctor or health care professional as soon as possible: -allergic reactions like skin rash, itching or hives, swelling of the face, lips, or tongue -breast lumps, breast tissue changes, or discharge -breathing problems -changes in emotions or moods -if you feel that the implant may have  broken or bent while in  your arm -high blood pressure -pain, irritation, swelling, or bruising at the insertion site -scar at site of insertion -signs of infection at the insertion site such as fever, and skin redness, pain or discharge -signs and symptoms of a blood clot such as breathing problems; changes in vision; chest pain; severe, sudden headache; pain, swelling, warmth in the leg; trouble speaking; sudden numbness or weakness of the face, arm or leg -signs and symptoms of liver injury like dark yellow or brown urine; general ill feeling or flu-like symptoms; light-colored stools; loss of appetite; nausea; right upper belly pain; unusually weak or tired; yellowing of the eyes or skin -unusual vaginal bleeding, discharge Side effects that usually do not require medical attention (report to your doctor or health care professional if they continue or are bothersome): -acne -breast pain or tenderness -headache -irregular menstrual bleeding -nausea This list may not describe all possible side effects. Call your doctor for medical advice about side effects. You may report side effects to FDA at 1-800-FDA-1088. Where should I keep my medicine? This drug is given in a hospital or clinic and will not be stored at home. NOTE: This sheet is a summary. It may not cover all possible information. If you have questions about this medicine, talk to your doctor, pharmacist, or health care provider.  2019 Elsevier/Gold Standard (2017-06-18 14:11:42)

## 2018-10-22 NOTE — Progress Notes (Signed)
  HPI:  Stephanie Johns is a 23 y.o. year old Caucasian female here for Nexplanon insertion.  She is postpartum , and her pregnancy test today was negative.  Risks/benefits/side effects of Nexplanon have been discussed and her questions have been answered.  Specifically, a failure rate of 08/998 has been reported, with an increased failure rate if pt takes St. John's Wort and/or antiseizure medicaitons.  Stephanie Johns is aware of the common side effect of irregular bleeding, which the incidence of decreases over time.   Past Medical History: Past Medical History:  Diagnosis Date  . Anxiety   . High cholesterol   . History of DVT of lower extremity   . Vitamin D deficiency     Past Surgical History: Past Surgical History:  Procedure Laterality Date  . CESAREAN SECTION N/A 09/13/2018   Procedure: CESAREAN SECTION;  Surgeon: Reva Bores, MD;  Location: [facility];  Service: Obstetrics;  Laterality: N/A;  . NO PAST SURGERIES      Family History: Family History  Problem Relation Age of Onset  . Hyperlipidemia Maternal Grandmother   . Hyperlipidemia Maternal Grandfather   . Hypertension Maternal Grandfather   . Heart attack Maternal Grandfather   . Diabetes Maternal Grandfather   . Rheum arthritis Mother   . Other Mother        fatty liver  . Hypertension Mother     Social History: Social History   Tobacco Use  . Smoking status: Never Smoker  . Smokeless tobacco: Never Used  Substance Use Topics  . Alcohol use: Not Currently    Comment: rarely  . Drug use: No    Allergies: No Known Allergies    Her left arm, approximatly 4 inches proximal from the elbow, was cleansed with alcohol and anesthetized with 2cc of 2% Lidocaine.  The area was cleansed again and the Nexplanon was inserted without difficulty.  A pressure bandage was applied.  Pt was instructed to remove pressure bandage in a few hours, and keep insertion site covered with a bandaid for 3 days.   Back up contraception was recommended for 2 weeks.  Follow-up scheduled PRN problems  Stephanie Johns 10/22/2018 2:15 PM

## 2018-11-04 DIAGNOSIS — E785 Hyperlipidemia, unspecified: Secondary | ICD-10-CM | POA: Diagnosis not present

## 2018-11-04 DIAGNOSIS — M79604 Pain in right leg: Secondary | ICD-10-CM | POA: Diagnosis not present

## 2018-11-05 ENCOUNTER — Telehealth: Payer: Self-pay | Admitting: Women's Health

## 2018-11-05 DIAGNOSIS — M79604 Pain in right leg: Secondary | ICD-10-CM | POA: Diagnosis not present

## 2018-11-05 DIAGNOSIS — I82411 Acute embolism and thrombosis of right femoral vein: Secondary | ICD-10-CM | POA: Diagnosis not present

## 2018-11-05 NOTE — Telephone Encounter (Signed)
Patient called stated she had a nexplanon put in two weeks ago, stated she had an ultrasound today and she has a blood clot in her leg.  She is scared and wants to know what to do.  [phone]

## 2018-11-05 NOTE — Telephone Encounter (Signed)
Pt had nexplanon inserted about 2 weeks ago and stopped lovenox about a week ago and had pain in right leg and had [place] today and has DVT, placed on xarelto and she wants nexplanon removed, is thinking para guard IUD as option, and is going to see vein doctor, has DVT about 2-3 years ago and had negative work up she says for clotting disorder. Appt made for 4/3 at 1 pm for nexplanon removal

## 2018-11-14 ENCOUNTER — Ambulatory Visit (INDEPENDENT_AMBULATORY_CARE_PROVIDER_SITE_OTHER): Payer: BLUE CROSS/BLUE SHIELD | Admitting: Adult Health

## 2018-11-14 ENCOUNTER — Encounter: Payer: Self-pay | Admitting: Adult Health

## 2018-11-14 ENCOUNTER — Other Ambulatory Visit: Payer: Self-pay

## 2018-11-14 VITALS — BP 119/74 | HR 74 | Temp 98.6°F | Ht 66.0 in | Wt 188.0 lb

## 2018-11-14 DIAGNOSIS — Z3202 Encounter for pregnancy test, result negative: Secondary | ICD-10-CM | POA: Insufficient documentation

## 2018-11-14 DIAGNOSIS — Z86718 Personal history of other venous thrombosis and embolism: Secondary | ICD-10-CM

## 2018-11-14 DIAGNOSIS — Z3046 Encounter for surveillance of implantable subdermal contraceptive: Secondary | ICD-10-CM | POA: Diagnosis not present

## 2018-11-14 DIAGNOSIS — Z30011 Encounter for initial prescription of contraceptive pills: Secondary | ICD-10-CM | POA: Insufficient documentation

## 2018-11-14 LAB — POCT URINE PREGNANCY: Preg Test, Ur: NEGATIVE

## 2018-11-14 MED ORDER — NORETHINDRONE 0.35 MG PO TABS: 1.0000 | ORAL_TABLET | Freq: Every day | ORAL | 11 refills | Status: DC

## 2018-11-14 NOTE — Progress Notes (Signed)
Patient ID: Stephanie Johns, female   DOB: 1996/07/25, 23 y.o.   MRN: [id number] History of Present Illness: Stephanie Johns is a 29 year old white female married, recently delivered about 2 months ago and had nexplanon inserted about 2 weeks ago, then stopped lovenox, and had recurrent DVT, and is on xarelto. She wants the nexplanon removed, and wants POP.    Current Medications, Allergies, Past Medical History, Past Surgical History, Family History and Social History were reviewed in [name] record.     Review of Systems: For nexplanon removal    Physical Exam:BP 119/74 (BP Location: Left Arm, Patient Position: Sitting, Cuff Size: Normal)   Pulse 74   Temp 98.6 F (37 C)   Ht 5\' 6"  (1.676 m)   Wt 188 lb (85.3 kg)   LMP 10/29/2018 (Approximate)   Breastfeeding No   BMI 30.34 kg/m UPT negative. General:  Well developed, well nourished, no acute distress Skin:  Warm and dry Psych:  No mood changes, alert and cooperative,seems happy Consent signed and time out called. Left arm cleansed with betadine, and injected with 2.5 cc 1% lidocaine and waited til numb.Under sterile technique a #11 blade was used to make small vertical incision, and a curved forceps was used to easily remove rod. Steri strips applied. Pressure dressing applied.  Impression: 1. Encounter for Nexplanon removal  2. Pregnancy examination or test, negative result - POCT urine pregnancy  3. Encounter for initial prescription of contraceptive pills     rx micronor, start today and use condoms  4. History of DVT of lower extremity    Plan: Meds ordered this encounter  Medications  . norethindrone (MICRONOR,CAMILA,ERRIN) 0.35 MG tablet    Sig: Take 1 tablet (0.35 mg total) by mouth daily.    Dispense:  1 Package    Refill:  11    Order Specific Question:   Supervising Provider    Answer:   Duane Lope H [[id number]]  Use condoms x 4 weeks, keep clean and dry x 24 hours, no heavy lifting, keep  steri strips on x 72 hours, Keep pressure dressing on x 24 hours. Follow up prn problems.

## 2018-11-14 NOTE — Patient Instructions (Signed)
Use condoms x 4 weeks, keep clean and dry x 24 hours, no heavy lifting, keep steri strips on x 72 hours, Keep pressure dressing on x 24 hours. Follow up prn problems. Start micronor today

## 2018-12-04 ENCOUNTER — Encounter: Payer: BLUE CROSS/BLUE SHIELD | Admitting: Adult Health

## 2019-01-02 DIAGNOSIS — I824Z1 Acute embolism and thrombosis of unspecified deep veins of right distal lower extremity: Secondary | ICD-10-CM | POA: Diagnosis not present

## 2019-01-02 DIAGNOSIS — Z3200 Encounter for pregnancy test, result unknown: Secondary | ICD-10-CM | POA: Diagnosis not present

## 2019-01-02 DIAGNOSIS — E785 Hyperlipidemia, unspecified: Secondary | ICD-10-CM | POA: Diagnosis not present

## 2019-01-02 DIAGNOSIS — N390 Urinary tract infection, site not specified: Secondary | ICD-10-CM | POA: Diagnosis not present

## 2019-01-02 DIAGNOSIS — N912 Amenorrhea, unspecified: Secondary | ICD-10-CM | POA: Diagnosis not present

## 2019-01-06 DIAGNOSIS — R5383 Other fatigue: Secondary | ICD-10-CM | POA: Diagnosis not present

## 2019-01-06 DIAGNOSIS — E785 Hyperlipidemia, unspecified: Secondary | ICD-10-CM | POA: Diagnosis not present

## 2019-01-06 DIAGNOSIS — E559 Vitamin D deficiency, unspecified: Secondary | ICD-10-CM | POA: Diagnosis not present

## 2019-03-26 DIAGNOSIS — Z3200 Encounter for pregnancy test, result unknown: Secondary | ICD-10-CM | POA: Diagnosis not present

## 2019-05-12 DIAGNOSIS — Z23 Encounter for immunization: Secondary | ICD-10-CM | POA: Diagnosis not present

## 2019-05-15 DIAGNOSIS — R197 Diarrhea, unspecified: Secondary | ICD-10-CM | POA: Diagnosis not present

## 2019-05-15 DIAGNOSIS — Z1159 Encounter for screening for other viral diseases: Secondary | ICD-10-CM | POA: Diagnosis not present

## 2019-05-15 DIAGNOSIS — R5381 Other malaise: Secondary | ICD-10-CM | POA: Diagnosis not present

## 2019-05-15 DIAGNOSIS — S0006XA Insect bite (nonvenomous) of scalp, initial encounter: Secondary | ICD-10-CM | POA: Diagnosis not present

## 2019-05-15 DIAGNOSIS — R1084 Generalized abdominal pain: Secondary | ICD-10-CM | POA: Diagnosis not present

## 2019-05-15 DIAGNOSIS — R110 Nausea: Secondary | ICD-10-CM | POA: Diagnosis not present

## 2019-06-22 DIAGNOSIS — Z20828 Contact with and (suspected) exposure to other viral communicable diseases: Secondary | ICD-10-CM | POA: Diagnosis not present

## 2019-06-23 DIAGNOSIS — G4489 Other headache syndrome: Secondary | ICD-10-CM | POA: Diagnosis not present

## 2019-06-23 DIAGNOSIS — Z20828 Contact with and (suspected) exposure to other viral communicable diseases: Secondary | ICD-10-CM | POA: Diagnosis not present

## 2019-06-23 DIAGNOSIS — G43109 Migraine with aura, not intractable, without status migrainosus: Secondary | ICD-10-CM | POA: Diagnosis not present

## 2019-06-23 DIAGNOSIS — I82502 Chronic embolism and thrombosis of unspecified deep veins of left lower extremity: Secondary | ICD-10-CM | POA: Diagnosis not present

## 2019-06-26 DIAGNOSIS — G43109 Migraine with aura, not intractable, without status migrainosus: Secondary | ICD-10-CM | POA: Diagnosis not present

## 2019-06-26 DIAGNOSIS — Z20828 Contact with and (suspected) exposure to other viral communicable diseases: Secondary | ICD-10-CM | POA: Diagnosis not present

## 2019-09-07 ENCOUNTER — Ambulatory Visit: Payer: Self-pay | Admitting: Adult Health

## 2019-09-11 ENCOUNTER — Ambulatory Visit: Payer: Self-pay | Admitting: Adult Health

## 2019-09-30 ENCOUNTER — Other Ambulatory Visit: Payer: Self-pay | Admitting: Family

## 2019-09-30 DIAGNOSIS — N6002 Solitary cyst of left breast: Secondary | ICD-10-CM

## 2019-10-08 ENCOUNTER — Encounter: Payer: Self-pay | Admitting: Radiology

## 2019-10-08 ENCOUNTER — Ambulatory Visit
Admission: RE | Admit: 2019-10-08 | Discharge: 2019-10-08 | Disposition: A | Discharge status: Home / Self Care | Payer: BLUE CROSS/BLUE SHIELD | Source: Ambulatory Visit | Attending: Family | Admitting: Family

## 2019-10-08 DIAGNOSIS — N6002 Solitary cyst of left breast: Secondary | ICD-10-CM | POA: Diagnosis present

## 2019-10-09 ENCOUNTER — Other Ambulatory Visit: Payer: Self-pay | Admitting: Adult Health

## 2019-10-09 ENCOUNTER — Other Ambulatory Visit: Payer: Self-pay | Admitting: Family

## 2019-10-09 DIAGNOSIS — R928 Other abnormal and inconclusive findings on diagnostic imaging of breast: Secondary | ICD-10-CM

## 2019-10-09 DIAGNOSIS — N63 Unspecified lump in unspecified breast: Secondary | ICD-10-CM

## 2019-10-23 ENCOUNTER — Other Ambulatory Visit (HOSPITAL_COMMUNITY)
Admission: RE | Admit: 2019-10-23 | Discharge: 2019-10-23 | Disposition: A | Discharge status: Home / Self Care | Payer: BLUE CROSS/BLUE SHIELD | Source: Ambulatory Visit | Attending: Obstetrics and Gynecology | Admitting: Obstetrics and Gynecology

## 2019-10-23 ENCOUNTER — Ambulatory Visit (INDEPENDENT_AMBULATORY_CARE_PROVIDER_SITE_OTHER): Payer: BLUE CROSS/BLUE SHIELD | Admitting: Obstetrics and Gynecology

## 2019-10-23 ENCOUNTER — Other Ambulatory Visit: Payer: Self-pay

## 2019-10-23 ENCOUNTER — Encounter: Payer: Self-pay | Admitting: Obstetrics and Gynecology

## 2019-10-23 VITALS — BP 123/78 | HR 74 | Ht 66.0 in | Wt 195.8 lb

## 2019-10-23 DIAGNOSIS — Z01419 Encounter for gynecological examination (general) (routine) without abnormal findings: Secondary | ICD-10-CM | POA: Diagnosis not present

## 2019-10-23 NOTE — Progress Notes (Signed)
Patient ID: Stephanie Johns, female   DOB: Nov 07, 1995, 24 y.o.   MRN: [id number]   Assessment:  Annual Gyn Exam Breast nodule; benign left axilla, being followed 6 months.  Plan:  1. pap smear done, next pap due 3 years 2. return annually or prn 3    Annual mammogram advised after age 60 Subjective:  Stephanie Johns is a 24 y.o. female G2P1011 who presents for annual exam. No LMP recorded. (Menstrual status: Oral contraceptives). The patient has complaints today of none. Her child recently turned 1. She takes her birth control regularly. She doesn't have any periods on the pill. She has u/s of her breast to reveal left breast nodule under pectoralis muscle left axilla., results came back benign.   The following portions of the patient's history were reviewed and updated as appropriate: allergies, current medications, past family history, past medical history, past social history, past surgical history and problem list. Past Medical History:  Diagnosis Date  . Anxiety   . High cholesterol   . History of DVT of lower extremity   . Vitamin D deficiency     Past Surgical History:  Procedure Laterality Date  . CESAREAN SECTION N/A 09/13/2018   Procedure: CESAREAN SECTION;  Surgeon: Reva Bores, MD;  Location: [facility];  Service: Obstetrics;  Laterality: N/A;  . NO PAST SURGERIES       Current Outpatient Medications:  .  NORLYDA 0.35 MG tablet, TAKE 1 TABLET(0.35 MG) BY MOUTH DAILY, Disp: 28 tablet, Rfl: 1 .  Rivaroxaban (XARELTO) 15 MG TABS tablet, Take 15 mg by mouth 2 (two) times daily with a meal., Disp: , Rfl:   Review of Systems Constitutional: negative Gastrointestinal: negative Genitourinary: normal  Objective:  There were no vitals taken for this visit.   BMI: There is no height or weight on file to calculate BMI.  General Appearance: Alert, appropriate appearance for age. No acute distress HEENT: Grossly normal Neck / Thyroid:  Cardiovascular: RRR; normal  S1, S2, no murmur Lungs: CTA bilaterally Back: No CVAT Breast Exam:1 cm smooth firm round mobile Breast nodule on left side under edge of pectoralis muscle in left axilla with some tenderness.  Gastrointestinal: Soft, non-tender, no masses or organomegaly Pelvic Exam:  VAGINA: normal appearing vagina  CERVIX: normal appearing cervix  UTERUS: uterus is small and non-tender PAP: Pap smear done today. Lymphatic Exam: Non-palpable nodes in neck, clavicular, axillary, or inguinal regions Skin: no rash or abnormalities Neurologic: Normal gait and speech, no tremor  Psychiatric: Alert and oriented, appropriate affect.  Urinalysis:Not done  By signing my name below, I, Arnette Norris, attest that this documentation has been prepared under the direction and in the presence of Tilda Burrow, MD. Electronically Signed: Arnette Norris Medical Scribe. 10/23/19. 11:00 AM.  I personally performed the services described in this documentation, which was SCRIBED in my presence. The recorded information has been reviewed and considered accurate. It has been edited as necessary during review. Tilda Burrow, MD

## 2019-10-27 LAB — CYTOLOGY - PAP: Diagnosis: NEGATIVE

## 2019-12-11 ENCOUNTER — Other Ambulatory Visit: Payer: Self-pay | Admitting: Adult Health

## 2020-02-05 ENCOUNTER — Other Ambulatory Visit: Payer: Self-pay | Admitting: Adult Health

## 2020-03-25 ENCOUNTER — Ambulatory Visit: Payer: BLUE CROSS/BLUE SHIELD | Admitting: Adult Health

## 2020-04-04 ENCOUNTER — Ambulatory Visit: Payer: BLUE CROSS/BLUE SHIELD | Admitting: Adult Health

## 2020-04-08 ENCOUNTER — Other Ambulatory Visit: Payer: Self-pay | Admitting: Adult Health

## 2020-07-13 ENCOUNTER — Other Ambulatory Visit: Payer: Self-pay | Admitting: Adult Health

## 2020-08-24 ENCOUNTER — Encounter: Payer: Self-pay | Admitting: Adult Health

## 2020-08-24 ENCOUNTER — Ambulatory Visit: Payer: BLUE CROSS/BLUE SHIELD | Admitting: Adult Health

## 2020-08-24 ENCOUNTER — Other Ambulatory Visit: Payer: Self-pay

## 2020-08-24 VITALS — BP 126/74 | HR 89 | Ht 67.0 in | Wt 203.4 lb

## 2020-08-24 DIAGNOSIS — N926 Irregular menstruation, unspecified: Secondary | ICD-10-CM | POA: Insufficient documentation

## 2020-08-24 DIAGNOSIS — Z86718 Personal history of other venous thrombosis and embolism: Secondary | ICD-10-CM

## 2020-08-24 DIAGNOSIS — Z3009 Encounter for other general counseling and advice on contraception: Secondary | ICD-10-CM | POA: Diagnosis not present

## 2020-08-24 NOTE — Progress Notes (Signed)
  Subjective:     Patient ID: Stephanie Johns, female   DOB: [birth year], 25 y.o.   MRN: [id number]  HPI Stephanie Johns is a 25 year old white female, married, G2P1011, in to discuss irregular bleeding on POP, on xarelto, has history of DVT. She and her family living with grandparents while house being built, would like to get pregnant when in new house. PCP is Orson Eva, NP  Review of Systems Has 2 periods per month at times Feels like has gained weight Reviewed past medical,surgical, social and family history. Reviewed medications and allergies.     Objective:   Physical Exam BP 126/74 (BP Location: Right Arm, Patient Position: Sitting, Cuff Size: Normal)   Pulse 89   Ht 5\' 7"  (1.702 m)   Wt 203 lb 6.4 oz (92.3 kg)   LMP 08/10/2020 (Approximate)   BMI 31.86 kg/m  Skin warm and dry. Lungs: clear to ausculation bilaterally. Cardiovascular: regular rate and rhythm.     Upstream - 08/24/20 1534      Pregnancy Intention Screening   Does the patient want to become pregnant in the next year? Unsure    Does the patient's partner want to become pregnant in the next year? Unsure    Would the patient like to discuss contraceptive options today? Yes      Contraception Wrap Up   Current Method Oral Contraceptive    End Method Female Condom    Contraception Counseling Provided Yes          Assessment:     1. General counseling and advice on contraceptive management Pt wants to use condoms,use 100% of the time  2. History of DVT of lower extremity On xarelto but if gets pregnant would switch to lovenox   3. Irregular bleeding Stop norlyda and use condoms     Plan:     Follow up in 3 months

## 2020-12-02 ENCOUNTER — Ambulatory Visit: Payer: BLUE CROSS/BLUE SHIELD | Admitting: Adult Health

## 2021-07-14 DIAGNOSIS — K219 Gastro-esophageal reflux disease without esophagitis: Secondary | ICD-10-CM | POA: Diagnosis not present

## 2021-07-14 DIAGNOSIS — I471 Supraventricular tachycardia: Secondary | ICD-10-CM | POA: Diagnosis not present

## 2021-07-14 DIAGNOSIS — K581 Irritable bowel syndrome with constipation: Secondary | ICD-10-CM | POA: Diagnosis not present

## 2021-07-14 DIAGNOSIS — R002 Palpitations: Secondary | ICD-10-CM | POA: Diagnosis not present

## 2021-07-19 DIAGNOSIS — E785 Hyperlipidemia, unspecified: Secondary | ICD-10-CM | POA: Diagnosis not present

## 2021-07-19 DIAGNOSIS — I10 Essential (primary) hypertension: Secondary | ICD-10-CM | POA: Diagnosis not present

## 2021-07-19 DIAGNOSIS — E559 Vitamin D deficiency, unspecified: Secondary | ICD-10-CM | POA: Diagnosis not present

## 2021-07-19 DIAGNOSIS — I471 Supraventricular tachycardia: Secondary | ICD-10-CM | POA: Diagnosis not present

## 2021-09-05 DIAGNOSIS — F411 Generalized anxiety disorder: Secondary | ICD-10-CM | POA: Diagnosis not present

## 2021-09-05 DIAGNOSIS — R1030 Lower abdominal pain, unspecified: Secondary | ICD-10-CM | POA: Diagnosis not present

## 2021-09-05 DIAGNOSIS — R202 Paresthesia of skin: Secondary | ICD-10-CM | POA: Diagnosis not present

## 2021-09-05 DIAGNOSIS — M5412 Radiculopathy, cervical region: Secondary | ICD-10-CM | POA: Diagnosis not present

## 2021-09-28 ENCOUNTER — Other Ambulatory Visit: Payer: Self-pay

## 2021-09-28 ENCOUNTER — Ambulatory Visit (INDEPENDENT_AMBULATORY_CARE_PROVIDER_SITE_OTHER): Payer: BC Managed Care – PPO | Admitting: Adult Health

## 2021-09-28 ENCOUNTER — Encounter: Payer: Self-pay | Admitting: Adult Health

## 2021-09-28 VITALS — BP 117/79 | HR 88 | Ht 67.0 in | Wt 207.6 lb

## 2021-09-28 DIAGNOSIS — Z86718 Personal history of other venous thrombosis and embolism: Secondary | ICD-10-CM

## 2021-09-28 DIAGNOSIS — N926 Irregular menstruation, unspecified: Secondary | ICD-10-CM | POA: Diagnosis not present

## 2021-09-28 DIAGNOSIS — Z01419 Encounter for gynecological examination (general) (routine) without abnormal findings: Secondary | ICD-10-CM | POA: Diagnosis not present

## 2021-09-28 NOTE — Progress Notes (Signed)
Patient ID: Stephanie Johns, female   DOB: [birth year], 26 y.o.   MRN: [id number] History of Present Illness: Stephanie Johns is a  26 year old white female, married, G2P1011, in for a well woman gyn exam. She is working from home. PCP is Orson Eva NP, in [place].   Lab Results  Component Value Date   DIAGPAP  10/23/2019    - Negative for intraepithelial lesion or malignancy (NILM)    Current Medications, Allergies, Past Medical History, Past Surgical History, Family History and Social History were reviewed in [name] record.     Review of Systems: Patient denies any headaches, hearing loss, fatigue, blurred vision, shortness of breath, chest pain, abdominal pain, problems with urination, or intercourse. No joint pain or mood swings.  Periods not regular, start times vary, does not skip Has IBS C/D Still on xarelto for hx DVT  Physical Exam:BP 117/79 (BP Location: Right Arm, Patient Position: Sitting, Cuff Size: Normal)    Pulse 88    Ht 5\' 7"  (1.702 m)    Wt 207 lb 9.6 oz (94.2 kg)    LMP 09/09/2021 (Approximate)    BMI 32.51 kg/m   General:  Well developed, well nourished, no acute distress Skin:  Warm and dry Neck:  Midline trachea, normal thyroid, good ROM, no lymphadenopathy Lungs; Clear to auscultation bilaterally Breast:  No dominant palpable mass, retraction, or nipple discharge Cardiovascular: Regular rate and rhythm Abdomen:  Soft, non tender, no hepatosplenomegaly Pelvic:  External genitalia is normal in appearance, no lesions.  The vagina is normal in appearance. Urethra has no lesions or masses. The cervix is smooth.  Uterus is felt to be normal size, shape, and contour.  No adnexal masses or tenderness noted.Bladder is non tender, no masses felt. Rectal: Deferred  Extremities/musculoskeletal:  No swelling or varicosities noted, no clubbing or cyanosis Psych:  No mood changes, alert and cooperative,seems happy AA is 1 Fall risk is  low Depression screen [facility] 2/9 09/28/2021 10/23/2019 02/19/2018  Decreased Interest 0 0 0  Down, Depressed, Hopeless 2 0 0  PHQ - 2 Score 2 0 0  Altered sleeping 2 - 1  Tired, decreased energy 0 - 0  Change in appetite 2 - 0  Feeling bad or failure about yourself  1 - 0  Trouble concentrating 1 - 0  Moving slowly or fidgety/restless 0 - 0  Suicidal thoughts 0 - 0  PHQ-9 Score 8 - 1    On Lexapro from PCP GAD 7 : Generalized Anxiety Score 09/28/2021  Nervous, Anxious, on Edge 2  Control/stop worrying 2  Worry too much - different things 2  Trouble relaxing 2  Restless 2  Easily annoyed or irritable 2  Afraid - awful might happen 1  Total GAD 7 Score 13      Upstream - 09/28/21 0934       Pregnancy Intention Screening   Does the patient want to become pregnant in the next year? Yes    Does the patient's partner want to become pregnant in the next year? Yes    Would the patient like to discuss contraceptive options today? No      Contraception Wrap Up   Current Method Pregnant/Seeking Pregnancy    End Method Pregnant/Seeking Pregnancy    Contraception Counseling Provided No            Not actively trying to get pregnant but not stopping it, know will switch from xarelto to Lovenox  Impression and Plan:  1. Encounter for well woman exam with routine gynecological exam Pap and physical in 1 year Labs with PCP  2. History of DVT of lower extremity On xarelto  3. Irregular bleeding Start times vary but does not skip

## 2021-10-17 IMAGING — US US BREAST*L* LIMITED INC AXILLA
1 series · 9 of 9 positions shown · non-contrast
Comparison: None.

CLINICAL DATA: 23-year-old patient with a palpable area of concern
in the upper-outer quadrant of the LEFT breast (1 o'clock) with
shooting pain.

EXAM:
ULTRASOUND OF THE LEFT BREAST

[Series 1: us breast*left* limited inc axilla · 0.06mm/px · 9 of 9 slices shown]
[im 1/9]
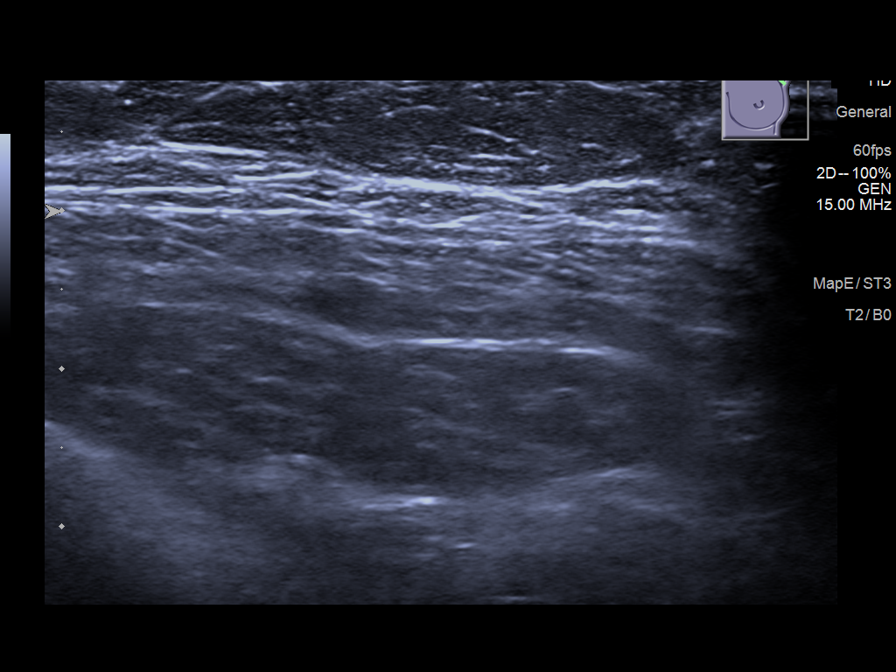
[im 2/9]
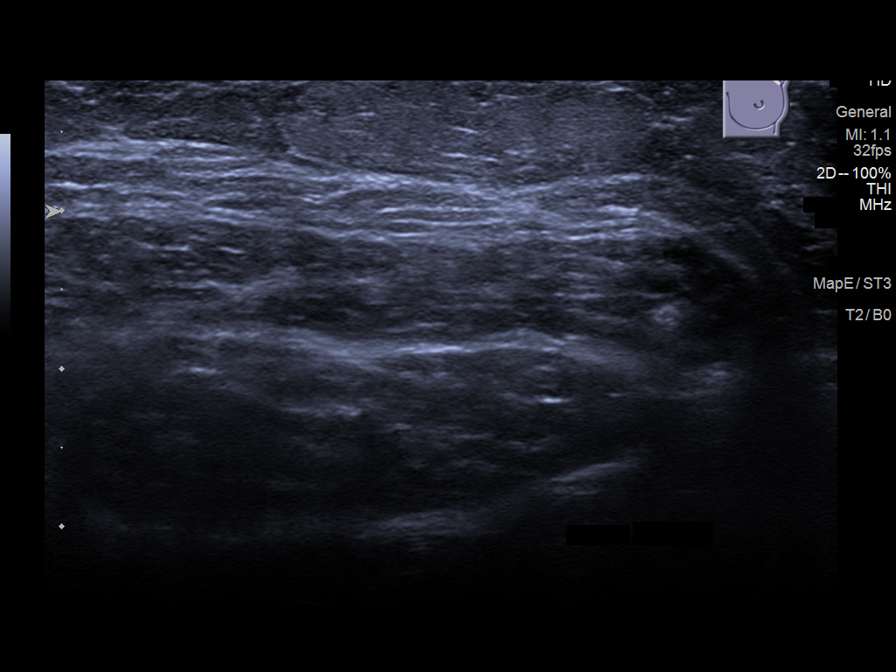
[im 3/9]
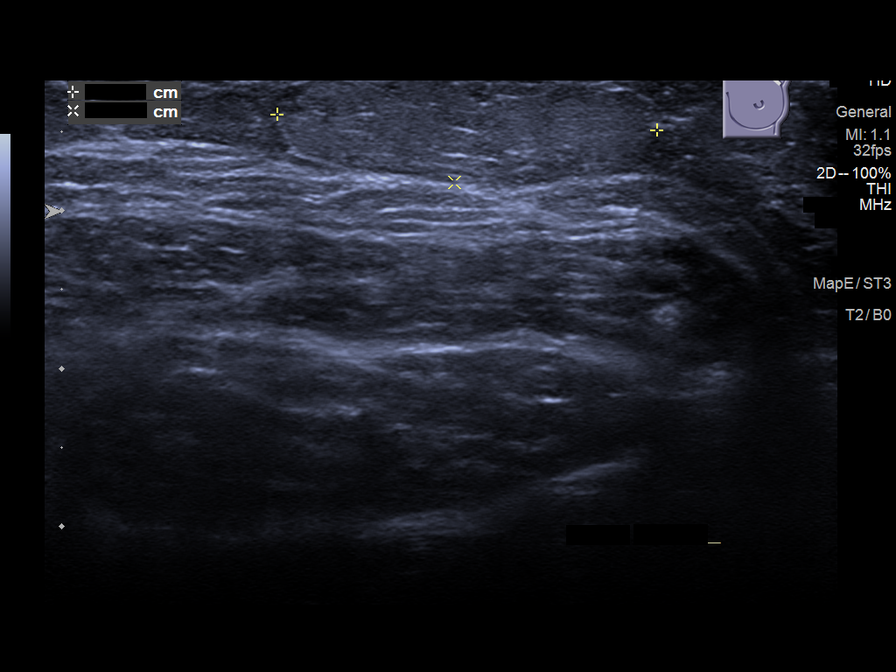
[im 4/9]
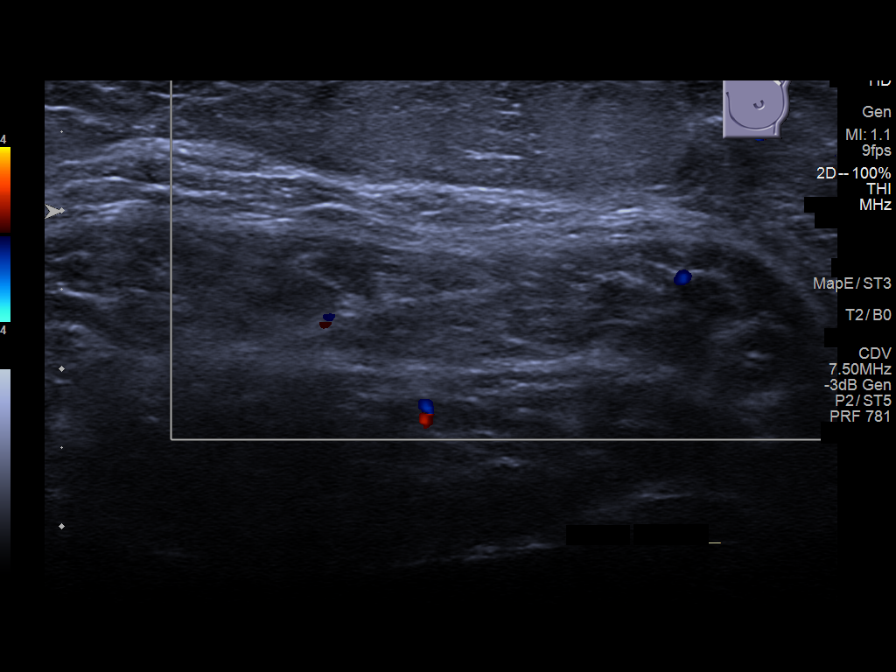
[im 5/9]
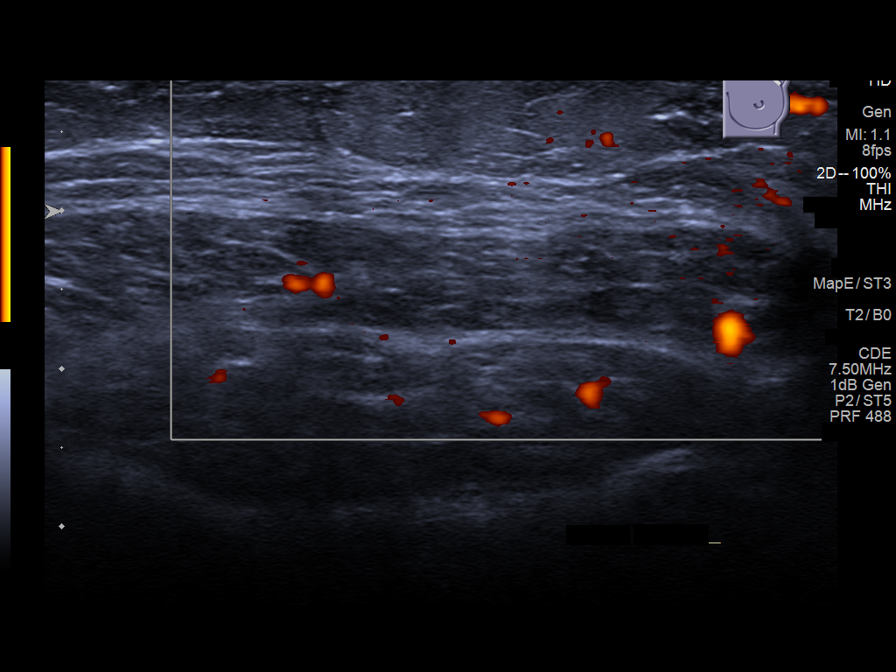
[im 6/9]
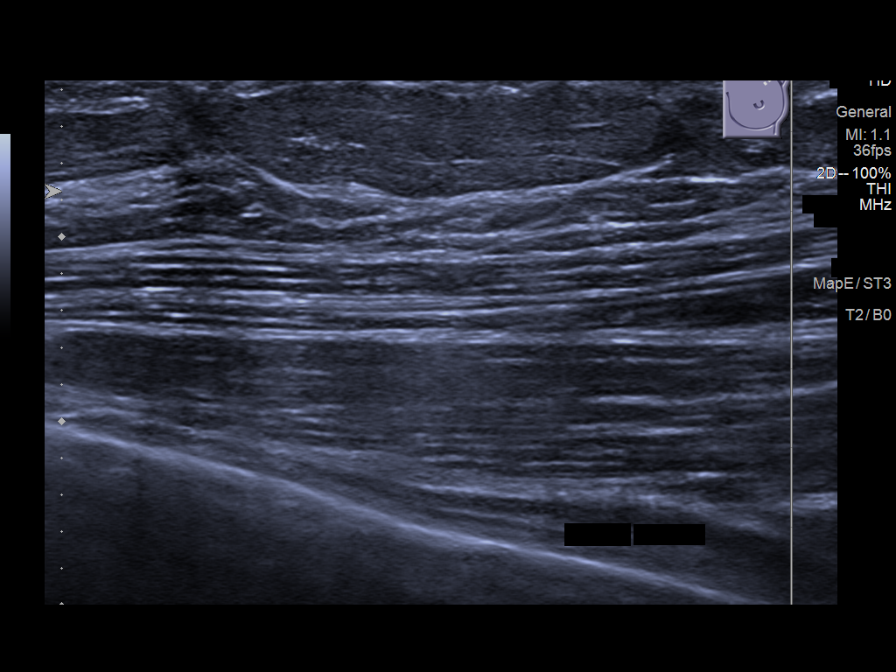
[im 7/9]
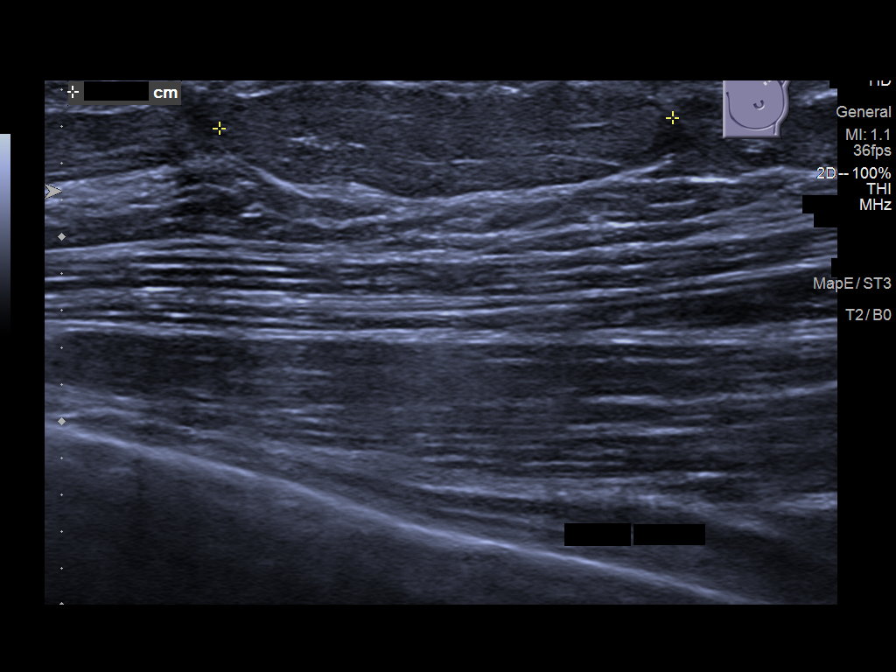
[im 8/9]
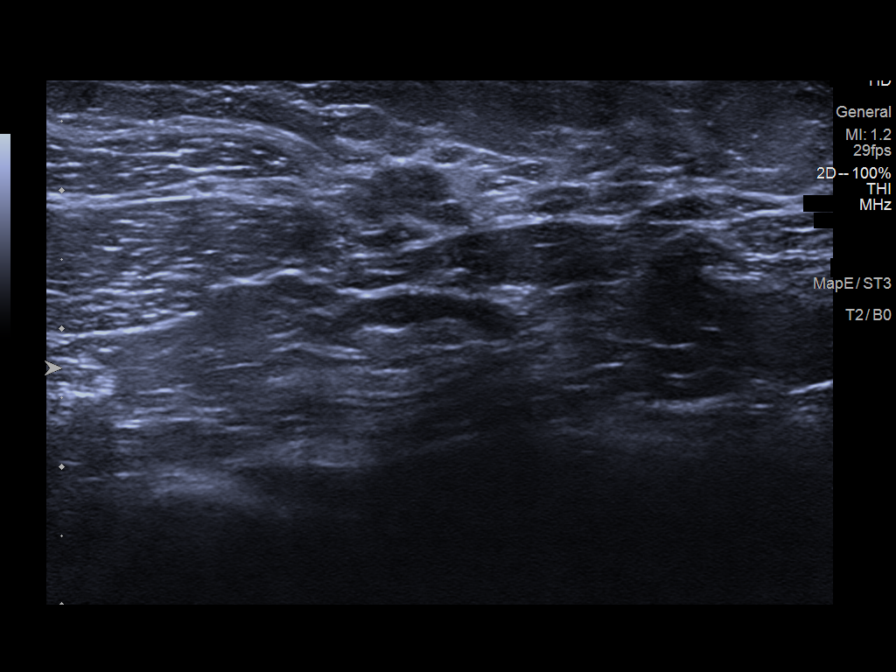
[im 9/9]
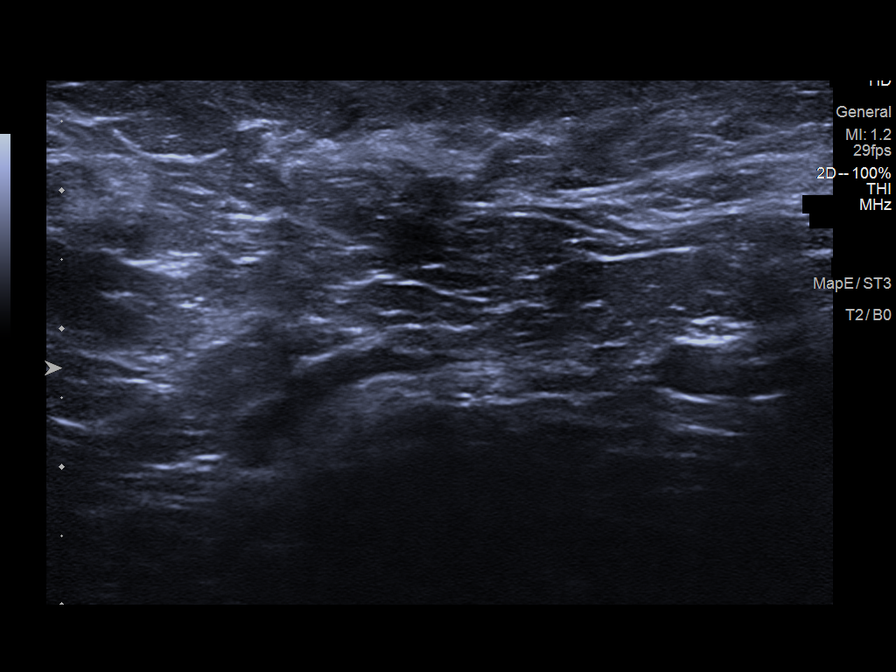

[9 of 9 positions shown; findings below may reference images not displayed]

FINDINGS: On physical exam, patient directs me to a palpable thickening within
the upper-outer quadrant of the LEFT breast/lower LEFT axilla, 1
o'clock axis, 14 cm from the nipple.

Targeted ultrasound is performed, showing a subtle hyperechoic area
in the LEFT breast at the 1 o'clock axis, 14 cm from the nipple,
measuring 2.5 x 0.7 x 2.4 cm, most compatible with a benign lipoma
or benign fat lobule, less likely benign fibroadenoma. No suspicious
solid or cystic mass is identified.
IMPRESSION: Probably benign lipoma or fat lobule, less likely fibroadenoma,
within the LEFT breast at the 1 o'clock axis, 14 cm from the nipple,
corresponding to the area of clinical concern. Recommend follow-up
LEFT breast ultrasound in 6 months to ensure stability.

RECOMMENDATION:
1. LEFT breast ultrasound in 6 months.
2. The patient was instructed to return sooner if the area that she
feels becomes larger and/or firmer to palpation, or if a new
palpable abnormality is identified in either breast.
3. Benign causes of breast pain, and possible remedies, were
discussed with the patient.

I have discussed the findings and recommendations with the patient.
If applicable, a reminder letter will be sent to the patient
regarding the next appointment.

BI-RADS CATEGORY  3: Probably benign.

## 2021-11-20 DIAGNOSIS — L509 Urticaria, unspecified: Secondary | ICD-10-CM | POA: Diagnosis not present

## 2021-11-22 DIAGNOSIS — L509 Urticaria, unspecified: Secondary | ICD-10-CM | POA: Diagnosis not present

## 2022-06-15 DIAGNOSIS — E785 Hyperlipidemia, unspecified: Secondary | ICD-10-CM | POA: Diagnosis not present

## 2022-06-15 DIAGNOSIS — R7303 Prediabetes: Secondary | ICD-10-CM | POA: Diagnosis not present

## 2022-06-15 DIAGNOSIS — E559 Vitamin D deficiency, unspecified: Secondary | ICD-10-CM | POA: Diagnosis not present

## 2022-06-15 DIAGNOSIS — G43C0 Periodic headache syndromes in child or adult, not intractable: Secondary | ICD-10-CM | POA: Diagnosis not present

## 2022-06-15 DIAGNOSIS — D519 Vitamin B12 deficiency anemia, unspecified: Secondary | ICD-10-CM | POA: Diagnosis not present

## 2022-06-15 DIAGNOSIS — G4489 Other headache syndrome: Secondary | ICD-10-CM | POA: Diagnosis not present

## 2022-06-15 DIAGNOSIS — R208 Other disturbances of skin sensation: Secondary | ICD-10-CM | POA: Diagnosis not present

## 2022-07-12 DIAGNOSIS — G43C0 Periodic headache syndromes in child or adult, not intractable: Secondary | ICD-10-CM | POA: Diagnosis not present

## 2022-11-23 ENCOUNTER — Telehealth: Payer: Self-pay

## 2022-11-23 MED ORDER — NITROFURANTOIN MONOHYD MACRO 100 MG PO CAPS: 100.0000 mg | ORAL_CAPSULE | Freq: Two times a day (BID) | ORAL | 0 refills | Status: DC

## 2022-11-23 NOTE — Addendum Note (Signed)
Addended by: Grayling Congress on: 11/23/2022 03:44 PM   Modules accepted: Orders

## 2022-11-23 NOTE — Telephone Encounter (Signed)
Patient called asking if youd send in nitrofuritan to  the [facility] in [place]

## 2023-02-05 ENCOUNTER — Telehealth: Payer: Self-pay

## 2023-02-05 NOTE — Telephone Encounter (Signed)
Patient called she's got pink eye and is requesting to get something called in since she's is about to leave for the [facility]

## 2023-02-08 ENCOUNTER — Ambulatory Visit: Payer: BC Managed Care – PPO | Admitting: Adult Health

## 2023-02-28 ENCOUNTER — Encounter: Payer: Self-pay | Admitting: Adult Health

## 2023-02-28 ENCOUNTER — Ambulatory Visit (INDEPENDENT_AMBULATORY_CARE_PROVIDER_SITE_OTHER): Payer: BC Managed Care – PPO | Admitting: Adult Health

## 2023-02-28 ENCOUNTER — Other Ambulatory Visit (HOSPITAL_COMMUNITY)
Admission: RE | Admit: 2023-02-28 | Discharge: 2023-02-28 | Disposition: A | Discharge status: Home / Self Care | Payer: BC Managed Care – PPO | Source: Ambulatory Visit | Attending: Adult Health | Admitting: Adult Health

## 2023-02-28 VITALS — BP 127/79 | HR 79 | Ht 67.0 in | Wt 210.0 lb

## 2023-02-28 DIAGNOSIS — Z86718 Personal history of other venous thrombosis and embolism: Secondary | ICD-10-CM | POA: Diagnosis not present

## 2023-02-28 DIAGNOSIS — Z01419 Encounter for gynecological examination (general) (routine) without abnormal findings: Secondary | ICD-10-CM | POA: Diagnosis not present

## 2023-02-28 NOTE — Progress Notes (Signed)
Patient ID: Stephanie Johns, female   DOB: 02/11/1996, 27 y.o.   MRN: [id number] History of Present Illness: Stephanie Johns is a 27 year old white female,married, G2P1011 in for a well woman gyn exam and pap.  PCP is A. Talbert Forest FNP   Current Medications, Allergies, Past Medical History, Past Surgical History, Family History and Social History were reviewed in [name] record.     Review of Systems: Patient denies any headaches, hearing loss, fatigue, blurred vision, shortness of breath, chest pain, abdominal pain, problems with bowel movements, urination, or intercourse. No joint pain or mood swings.  May want to get pregnant next year, taking son to [facility] in December    Physical Exam:BP 127/79 (BP Location: Left Arm, Patient Position: Sitting, Cuff Size: Normal)   Pulse 79   Ht 5\' 7"  (1.702 m)   Wt 210 lb (95.3 kg)   LMP 02/05/2023   BMI 32.89 kg/m   General:  Well developed, well nourished, no acute distress Skin:  Warm and dry Neck:  Midline trachea, normal thyroid, good ROM, no lymphadenopathy Lungs; Clear to auscultation bilaterally Breast:  No dominant palpable mass, retraction, or nipple discharge Cardiovascular: Regular rate and rhythm Abdomen:  Soft, non tender, no hepatosplenomegaly Pelvic:  External genitalia is normal in appearance, no lesions.  The vagina is normal in appearance. Urethra has no lesions or masses. The cervix is smooth,pap with HR HPV genotyping performed.  Uterus is felt to be normal size, shape, and contour.  No adnexal masses or tenderness noted.Bladder is non tender, no masses felt Extremities/musculoskeletal:  No swelling or varicosities noted, no clubbing or cyanosis Psych:  No mood changes, alert and cooperative,seems happy AA is 2 Fall risk is low    02/28/2023    3:31 PM 09/28/2021    9:35 AM 10/23/2019   11:07 AM  Depression screen PHQ 2/9  Decreased Interest 0 0 0  Down, Depressed, Hopeless 1 2 0  PHQ - 2 Score 1 2 0   Altered sleeping 2 2   Tired, decreased energy 1 0   Change in appetite 2 2   Feeling bad or failure about yourself  1 1   Trouble concentrating 3 1   Moving slowly or fidgety/restless 0 0   Suicidal thoughts 0 0   PHQ-9 Score 10 8        02/28/2023    3:31 PM 09/28/2021    9:35 AM  GAD 7 : Generalized Anxiety Score  Nervous, Anxious, on Edge 2 2  Control/stop worrying 2 2  Worry too much - different things 2 2  Trouble relaxing 2 2  Restless 2 2  Easily annoyed or irritable 3 2  Afraid - awful might happen 2 1  Total GAD 7 Score 15 13      Upstream - 02/28/23 1536       Pregnancy Intention Screening   Does the patient want to become pregnant in the next year? Ok Either Way    Does the patient's partner want to become pregnant in the next year? Ok Either Way    Would the patient like to discuss contraceptive options today? No      Contraception Wrap Up   Current Method No Method - Other Reason    Reason for No Current Contraceptive Method at Intake ([facility] Only) Other    End Method No Method - Other Reason            Examination chaperoned by Malachy Mood  LPN  Impression and Plan: 1. Encounter for gynecological examination with Papanicolaou smear of cervix Pap sent Pap in 3 years if normal Physical in 1 year Labs with PCP - Cytology - PAP( [employer])  2. History of DVT of lower extremity On xarelto

## 2023-03-05 LAB — CYTOLOGY - PAP
Comment: NEGATIVE
Diagnosis: NEGATIVE
High risk HPV: NEGATIVE

## 2023-03-20 ENCOUNTER — Other Ambulatory Visit: Payer: Self-pay | Admitting: Adult Health

## 2023-03-20 MED ORDER — FLUCONAZOLE 150 MG PO TABS: ORAL_TABLET | ORAL | 1 refills | Status: DC

## 2023-03-20 NOTE — Progress Notes (Signed)
Rx diflucan.  

## 2023-05-01 ENCOUNTER — Other Ambulatory Visit: Payer: Self-pay | Admitting: Family

## 2023-05-01 ENCOUNTER — Ambulatory Visit: Payer: BC Managed Care – PPO | Admitting: Family

## 2023-05-01 ENCOUNTER — Encounter: Payer: Self-pay | Admitting: Family

## 2023-05-01 VITALS — BP 137/73 | HR 76 | Ht 69.0 in | Wt 209.2 lb

## 2023-05-01 DIAGNOSIS — Z86718 Personal history of other venous thrombosis and embolism: Secondary | ICD-10-CM

## 2023-05-01 DIAGNOSIS — E039 Hypothyroidism, unspecified: Secondary | ICD-10-CM | POA: Diagnosis not present

## 2023-05-01 DIAGNOSIS — R7303 Prediabetes: Secondary | ICD-10-CM

## 2023-05-01 DIAGNOSIS — E559 Vitamin D deficiency, unspecified: Secondary | ICD-10-CM

## 2023-05-01 DIAGNOSIS — E782 Mixed hyperlipidemia: Secondary | ICD-10-CM

## 2023-05-01 DIAGNOSIS — E538 Deficiency of other specified B group vitamins: Secondary | ICD-10-CM

## 2023-05-01 DIAGNOSIS — R5383 Other fatigue: Secondary | ICD-10-CM

## 2023-05-01 DIAGNOSIS — I10 Essential (primary) hypertension: Secondary | ICD-10-CM

## 2023-05-02 LAB — CMP14+EGFR
ALT: 20 IU/L (ref 0–32)
AST: 22 IU/L (ref 0–40)
Albumin: 4.6 g/dL (ref 4.0–5.0)
Alkaline Phosphatase: 81 IU/L (ref 44–121)
BUN/Creatinine Ratio: 14 (ref 9–23)
BUN: 10 mg/dL (ref 6–20)
Bilirubin Total: 0.3 mg/dL (ref 0.0–1.2)
CO2: 22 mmol/L (ref 20–29)
Calcium: 9.9 mg/dL (ref 8.7–10.2)
Chloride: 100 mmol/L (ref 96–106)
Creatinine, Ser: 0.71 mg/dL (ref 0.57–1.00)
Globulin, Total: 2.9 g/dL (ref 1.5–4.5)
Glucose: 80 mg/dL (ref 70–99)
Potassium: 4.8 mmol/L (ref 3.5–5.2)
Sodium: 138 mmol/L (ref 134–144)
Total Protein: 7.5 g/dL (ref 6.0–8.5)
eGFR: 119 mL/min/{1.73_m2} (ref >59)

## 2023-05-02 LAB — LIPID PANEL
Chol/HDL Ratio: 4.5 ratio — ABNORMAL HIGH (ref 0.0–4.4)
Cholesterol, Total: 205 mg/dL — ABNORMAL HIGH (ref 100–199)
HDL: 46 mg/dL (ref >39)
LDL Chol Calc (NIH): 130 mg/dL — ABNORMAL HIGH (ref 0–99)
Triglycerides: 160 mg/dL — ABNORMAL HIGH (ref 0–149)
VLDL Cholesterol Cal: 29 mg/dL (ref 5–40)

## 2023-05-02 LAB — HEMOGLOBIN A1C
Est. average glucose Bld gHb Est-mCnc: 123 mg/dL
Hgb A1c MFr Bld: 5.9 % — ABNORMAL HIGH (ref 4.8–5.6)

## 2023-05-02 LAB — PROTIME-INR
INR: 1 (ref 0.9–1.2)
Prothrombin Time: 10.9 s (ref 9.1–12.0)

## 2023-05-02 LAB — TSH: TSH: 2.84 u[IU]/mL (ref 0.450–4.500)

## 2023-05-02 LAB — VITAMIN D 25 HYDROXY (VIT D DEFICIENCY, FRACTURES): Vit D, 25-Hydroxy: 32.6 ng/mL (ref 30.0–100.0)

## 2023-05-02 LAB — VITAMIN B12: Vitamin B-12: 488 pg/mL (ref 232–1245)

## 2023-05-09 ENCOUNTER — Other Ambulatory Visit: Payer: Self-pay

## 2023-05-09 MED ORDER — ROSUVASTATIN CALCIUM 5 MG PO TABS: 5.0000 mg | ORAL_TABLET | Freq: Every day | ORAL | 0 refills | Status: DC

## 2023-05-26 ENCOUNTER — Encounter: Payer: Self-pay | Admitting: Family

## 2023-05-26 NOTE — Progress Notes (Signed)
Established Patient Office Visit  Subjective:  Patient ID: Stephanie Johns, female    DOB: 1996/05/26  Age: 27 y.o. MRN: [id number]  No chief complaint on file.   Patient here today for follow up appointment.  She is doing well overall, but needs to have her labs checked.   Needs refills as well.     No other concerns at this time.   Past Medical History:  Diagnosis Date   Anxiety    High cholesterol    History of DVT of lower extremity    Vitamin D deficiency     Past Surgical History:  Procedure Laterality Date   CESAREAN SECTION N/A 09/13/2018   Procedure: CESAREAN SECTION;  Surgeon: Reva Bores, MD;  Location: [facility] BIR[facility]ING SU[facility];  Service: Obstetrics;  Laterality: N/A;   NO PAST SURGERIES      Social History   Socioeconomic History   Marital status: Married    Spouse name: Not on file   Number of children: 1   Years of education: Not on file   Highest education level: Not on file  Occupational History   Not on file  Tobacco Use   Smoking status: Never   Smokeless tobacco: Never  Vaping Use   Vaping status: Never Used  Substance and Sexual Activity   Alcohol use: Yes    Comment: rarely   Drug use: No   Sexual activity: Yes    Birth control/protection: None  Other Topics Concern   Not on file  Social History Narrative   Not on file   Social Determinants of Health   Financial Resource Strain: Low Risk  (02/28/2023)   Overall Financial Resource Strain (CARDIA)    Difficulty of Paying Living Expenses: Not hard at all  Food Insecurity: No Food Insecurity (02/28/2023)   Hunger Vital Sign    Worried About Running Out of Food in the Last Year: Never true    Ran Out of Food in the Last Year: Never true  Transportation Needs: No Transportation Needs (02/28/2023)   PRAPARE - Administrator, Civil Service (Medical): No    Lack of Transportation (Non-Medical): No  Physical Activity: Insufficiently Active (02/28/2023)   Exercise Vital Sign     Days of Exercise per Week: 4 days    Minutes of Exercise per Session: 30 min  Stress: Stress Concern Present (02/28/2023)   [employer] of Occupational Health - Occupational Stress Questionnaire    Feeling of Stress : Very much  Social Connections: Moderately Integrated (02/28/2023)   Social Connection and Isolation Panel [NHANES]    Frequency of Communication with Friends and Family: More than three times a week    Frequency of Social Gatherings with Friends and Family: Three times a week    Attends Religious Services: 1 to 4 times per year    Active Member of Clubs or Organizations: No    Attends Banker Meetings: Never    Marital Status: Married  Catering manager Violence: Not At Risk (02/28/2023)   Humiliation, Afraid, Rape, and Kick questionnaire    Fear of Current or Ex-Partner: No    Emotionally Abused: No    Physically Abused: No    Sexually Abused: No    Family History  Problem Relation Age of Onset   Cancer Maternal Grandmother    Hyperlipidemia Maternal Grandmother    Hyperlipidemia Maternal Grandfather    Hypertension Maternal Grandfather    Heart attack Maternal Grandfather    Diabetes Maternal  Grandfather    Pneumonia Maternal Grandfather    Rheum arthritis Mother    Other Mother        fatty liver   Hypertension Mother    Autism Son     No Known Allergies  Review of Systems  All other systems reviewed and are negative.      Objective:   BP 137/73   Pulse 76   Ht 5\' 9"  (1.753 m)   Wt 209 lb 3.2 oz (94.9 kg)   SpO2 98%   BMI 30.89 kg/m   Vitals:   05/01/23 1131  BP: 137/73  Pulse: 76  Height: 5\' 9"  (1.753 m)  Weight: 209 lb 3.2 oz (94.9 kg)  SpO2: 98%  BMI (Calculated): 30.88    Physical Exam Vitals and nursing note reviewed.  Constitutional:      Appearance: Normal appearance. She is normal weight.  HENT:     Head: Normocephalic.  Eyes:     Extraocular Movements: Extraocular movements intact.      Conjunctiva/sclera: Conjunctivae normal.     Pupils: Pupils are equal, round, and reactive to light.  Cardiovascular:     Rate and Rhythm: Normal rate and regular rhythm.  Pulmonary:     Effort: Pulmonary effort is normal.  Musculoskeletal:        General: Normal range of motion.  Neurological:     General: No focal deficit present.     Mental Status: She is alert and oriented to person, place, and time.  Psychiatric:        Mood and Affect: Mood normal.        Behavior: Behavior normal.        Thought Content: Thought content normal.        Judgment: Judgment normal.      Results for orders placed or performed in visit on 05/01/23  Protime-INR  Result Value Ref Range   INR 1.0 0.9 - 1.2   Prothrombin Time 10.9 9.1 - 12.0 sec  Results for orders placed or performed in visit on 05/01/23  Lipid panel  Result Value Ref Range   Cholesterol, Total 205 (H) 100 - 199 mg/dL   Triglycerides 161 (H) 0 - 149 mg/dL   HDL 46 >09 mg/dL   VLDL Cholesterol Cal 29 5 - 40 mg/dL   LDL Chol Calc (NIH) 604 (H) 0 - 99 mg/dL   Chol/HDL Ratio 4.5 (H) 0.0 - 4.4 ratio  VITAMIN D 25 Hydroxy (Vit-D Deficiency, Fractures)  Result Value Ref Range   Vit D, 25-Hydroxy 32.6 30.0 - 100.0 ng/mL  CMP14+EGFR  Result Value Ref Range   Glucose 80 70 - 99 mg/dL   BUN 10 6 - 20 mg/dL   Creatinine, Ser 5.40 0.57 - 1.00 mg/dL   eGFR 981 >19 JY/NWG/9.56   BUN/Creatinine Ratio 14 9 - 23   Sodium 138 134 - 144 mmol/L   Potassium 4.8 3.5 - 5.2 mmol/L   Chloride 100 96 - 106 mmol/L   CO2 22 20 - 29 mmol/L   Calcium 9.9 8.7 - 10.2 mg/dL   Total Protein 7.5 6.0 - 8.5 g/dL   Albumin 4.6 4.0 - 5.0 g/dL   Globulin, Total 2.9 1.5 - 4.5 g/dL   Bilirubin Total 0.3 0.0 - 1.2 mg/dL   Alkaline Phosphatase 81 44 - 121 IU/L   AST 22 0 - 40 IU/L   ALT 20 0 - 32 IU/L  TSH  Result Value Ref Range   TSH 2.840 0.450 -  4.500 uIU/mL  Hemoglobin A1c  Result Value Ref Range   Hgb A1c MFr Bld 5.9 (H) 4.8 - 5.6 %   Est.  average glucose Bld gHb Est-mCnc 123 mg/dL  Vitamin U04  Result Value Ref Range   Vitamin B-12 488 232 - 1,245 pg/mL    Recent Results (from the past 2160 hour(s))  Cytology - PAP( [place])     Status: None   Collection Time: 02/28/23  3:38 PM  Result Value Ref Range   High risk HPV Negative    Adequacy      Satisfactory for evaluation; transformation zone component PRESENT.   Diagnosis      - Negative for intraepithelial lesion or malignancy (NILM)   Microorganisms      Fungal organisms present consistent with Candida spp.   Comment Normal Reference Range HPV - Negative   Lipid panel     Status: Abnormal   Collection Time: 05/01/23 12:02 PM  Result Value Ref Range   Cholesterol, Total 205 (H) 100 - 199 mg/dL   Triglycerides 540 (H) 0 - 149 mg/dL   HDL 46 >98 mg/dL   VLDL Cholesterol Cal 29 5 - 40 mg/dL   LDL Chol Calc (NIH) 119 (H) 0 - 99 mg/dL   Chol/HDL Ratio 4.5 (H) 0.0 - 4.4 ratio    Comment:                                   T. Chol/HDL Ratio                                             Men  Women                               1/2 Avg.Risk  3.4    3.3                                   Avg.Risk  5.0    4.4                                2X Avg.Risk  9.6    7.1                                3X Avg.Risk 23.4   11.0   VITAMIN D 25 Hydroxy (Vit-D Deficiency, Fractures)     Status: None   Collection Time: 05/01/23 12:02 PM  Result Value Ref Range   Vit D, 25-Hydroxy 32.6 30.0 - 100.0 ng/mL    Comment: Vitamin D deficiency has been defined by the Institute of Medicine and an Endocrine Society practice guideline as a level of serum 25-OH vitamin D less than 20 ng/mL (1,2). The Endocrine Society went on to further define vitamin D insufficiency as a level between 21 and 29 ng/mL (2). 1. IOM (Institute of Medicine). 2010. Dietary reference    intakes for calcium and D. [place]: The    [employer]. 2. Holick MF, Binkley Morehouse, Bischoff-Ferrari HA, et al.     Evaluation, treatment,  and prevention of vitamin D    deficiency: an Endocrine Society clinical practice    guideline. JCEM. 2011 Jul; 96(7):1911-30.   CMP14+EGFR     Status: None   Collection Time: 05/01/23 12:02 PM  Result Value Ref Range   Glucose 80 70 - 99 mg/dL   BUN 10 6 - 20 mg/dL   Creatinine, Ser 1.61 0.57 - 1.00 mg/dL   eGFR 096 >04 VW/UJW/1.19   BUN/Creatinine Ratio 14 9 - 23   Sodium 138 134 - 144 mmol/L   Potassium 4.8 3.5 - 5.2 mmol/L   Chloride 100 96 - 106 mmol/L   CO2 22 20 - 29 mmol/L   Calcium 9.9 8.7 - 10.2 mg/dL   Total Protein 7.5 6.0 - 8.5 g/dL   Albumin 4.6 4.0 - 5.0 g/dL   Globulin, Total 2.9 1.5 - 4.5 g/dL   Bilirubin Total 0.3 0.0 - 1.2 mg/dL   Alkaline Phosphatase 81 44 - 121 IU/L   AST 22 0 - 40 IU/L   ALT 20 0 - 32 IU/L  TSH     Status: None   Collection Time: 05/01/23 12:02 PM  Result Value Ref Range   TSH 2.840 0.450 - 4.500 uIU/mL  Hemoglobin A1c     Status: Abnormal   Collection Time: 05/01/23 12:02 PM  Result Value Ref Range   Hgb A1c MFr Bld 5.9 (H) 4.8 - 5.6 %    Comment:          Prediabetes: 5.7 - 6.4          Diabetes: >6.4          Glycemic control for adults with diabetes: <7.0    Est. average glucose Bld gHb Est-mCnc 123 mg/dL  Vitamin J47     Status: None   Collection Time: 05/01/23 12:02 PM  Result Value Ref Range   Vitamin B-12 488 232 - 1,245 pg/mL  Protime-INR     Status: None   Collection Time: 05/01/23  4:18 PM  Result Value Ref Range   INR 1.0 0.9 - 1.2    Comment: Reference interval is for non-anticoagulated patients. Suggested INR therapeutic range for Vitamin K antagonist therapy:    Standard Dose (moderate intensity                   therapeutic range):       2.0 - 3.0    Higher intensity therapeutic range       2.5 - 3.5    Prothrombin Time 10.9 9.1 - 12.0 sec       Assessment & Plan:   Problem List Items Addressed This Visit       Active Problems   History of DVT of lower extremity   Other  Visit Diagnoses     B12 deficiency due to diet    -  Primary   Relevant Orders   CMP14+EGFR (Completed)   Vitamin B12 (Completed)   CBC with Differential/Platelet   Hypothyroidism (acquired)       Relevant Orders   CMP14+EGFR (Completed)   CBC with Differential/Platelet   Mixed hyperlipidemia       Relevant Orders   Lipid panel (Completed)   CMP14+EGFR (Completed)   CBC with Differential/Platelet   Vitamin D deficiency, unspecified       Relevant Orders   VITAMIN D 25 Hydroxy (Vit-D Deficiency, Fractures) (Completed)   CMP14+EGFR (Completed)   CBC with Differential/Platelet   Prediabetes       Relevant Orders  CMP14+EGFR (Completed)   Hemoglobin A1c (Completed)   CBC with Differential/Platelet   Other fatigue       Relevant Orders   CMP14+EGFR (Completed)   TSH (Completed)   CBC with Differential/Platelet   Essential hypertension, benign       Relevant Orders   CMP14+EGFR (Completed)   CBC with Differential/Platelet      Checking labwork today Will call patient with results.   Continue current medications for now.   No follow-ups on file.   Total time spent: 20 minutes  Miki Kins, FNP  05/01/2023   This document may have been prepared by [facility] Voice Recognition software and as such may include unintentional dictation errors.

## 2023-05-30 ENCOUNTER — Encounter: Payer: Self-pay | Admitting: Family

## 2023-05-31 ENCOUNTER — Other Ambulatory Visit: Payer: Self-pay

## 2023-05-31 MED ORDER — FLUCONAZOLE 150 MG PO TABS: ORAL_TABLET | ORAL | 1 refills | Status: DC

## 2023-06-14 ENCOUNTER — Ambulatory Visit: Payer: BC Managed Care – PPO | Admitting: Family

## 2023-06-14 ENCOUNTER — Encounter: Payer: Self-pay | Admitting: Family

## 2023-06-14 VITALS — BP 102/80 | HR 83 | Ht 67.0 in | Wt 208.8 lb

## 2023-06-14 DIAGNOSIS — Z23 Encounter for immunization: Secondary | ICD-10-CM

## 2023-06-14 DIAGNOSIS — Z0130 Encounter for examination of blood pressure without abnormal findings: Secondary | ICD-10-CM

## 2023-06-14 DIAGNOSIS — R591 Generalized enlarged lymph nodes: Secondary | ICD-10-CM

## 2023-06-14 LAB — CBC WITH DIFFERENTIAL/PLATELET
Basophils Absolute: 0.1 10*3/uL (ref 0.0–0.2)
Basos: 1 %
EOS (ABSOLUTE): 0.1 10*3/uL (ref 0.0–0.4)
Eos: 1 %
Hematocrit: 38.7 % (ref 34.0–46.6)
Hemoglobin: 12.4 g/dL (ref 11.1–15.9)
Immature Grans (Abs): 0 10*3/uL (ref 0.0–0.1)
Immature Granulocytes: 0 %
Lymphocytes Absolute: 2.1 10*3/uL (ref 0.7–3.1)
Lymphs: 27 %
MCH: 29.3 pg (ref 26.6–33.0)
MCHC: 32 g/dL (ref 31.5–35.7)
MCV: 92 fL (ref 79–97)
Monocytes Absolute: 0.7 10*3/uL (ref 0.1–0.9)
Monocytes: 9 %
Neutrophils Absolute: 4.9 10*3/uL (ref 1.4–7.0)
Neutrophils: 62 %
Platelets: 284 10*3/uL (ref 150–450)
RBC: 4.23 x10E6/uL (ref 3.77–5.28)
RDW: 13.2 % (ref 11.7–15.4)
WBC: 8 10*3/uL (ref 3.4–10.8)

## 2023-06-18 ENCOUNTER — Other Ambulatory Visit: Payer: BC Managed Care – PPO

## 2023-06-18 ENCOUNTER — Other Ambulatory Visit: Payer: Self-pay | Admitting: Adult Health

## 2023-06-18 DIAGNOSIS — Z319 Encounter for procreative management, unspecified: Secondary | ICD-10-CM

## 2023-06-18 NOTE — Progress Notes (Signed)
Ck progesterone 07/01/23

## 2023-06-28 ENCOUNTER — Telehealth: Payer: Self-pay | Admitting: Family

## 2023-06-28 NOTE — Telephone Encounter (Signed)
Pt called stating that she's coughing up blood some times and mucus green. Sore throat, fever last Saturday but not since. Now just having cough, drainage, cong & no voice. Can you please send pt something in to [employer].

## 2023-07-31 ENCOUNTER — Ambulatory Visit (INDEPENDENT_AMBULATORY_CARE_PROVIDER_SITE_OTHER): Payer: BC Managed Care – PPO

## 2023-07-31 DIAGNOSIS — R591 Generalized enlarged lymph nodes: Secondary | ICD-10-CM | POA: Diagnosis not present

## 2023-08-12 ENCOUNTER — Other Ambulatory Visit: Payer: Self-pay | Admitting: Family

## 2023-08-12 DIAGNOSIS — Z7721 Contact with and (suspected) exposure to potentially hazardous body fluids: Secondary | ICD-10-CM | POA: Diagnosis not present

## 2023-08-12 DIAGNOSIS — Z319 Encounter for procreative management, unspecified: Secondary | ICD-10-CM | POA: Diagnosis not present

## 2023-08-13 LAB — PROGESTERONE: Progesterone: 0.8 ng/mL

## 2023-08-13 LAB — HCV INTERPRETATION

## 2023-08-13 LAB — HIV ANTIBODY (ROUTINE TESTING W REFLEX): HIV Screen 4th Generation wRfx: NONREACTIVE

## 2023-08-13 LAB — HCV AB W REFLEX TO QUANT PCR: HCV Ab: NONREACTIVE

## 2023-08-18 ENCOUNTER — Encounter: Payer: Self-pay | Admitting: Family

## 2023-09-06 ENCOUNTER — Encounter: Payer: Self-pay | Admitting: Adult Health

## 2023-09-06 ENCOUNTER — Ambulatory Visit: Payer: BC Managed Care – PPO | Admitting: Adult Health

## 2023-09-06 VITALS — BP 132/74 | HR 82 | Ht 66.0 in | Wt 210.0 lb

## 2023-09-06 DIAGNOSIS — Z86718 Personal history of other venous thrombosis and embolism: Secondary | ICD-10-CM | POA: Diagnosis not present

## 2023-09-06 DIAGNOSIS — Z3201 Encounter for pregnancy test, result positive: Secondary | ICD-10-CM | POA: Diagnosis not present

## 2023-09-06 DIAGNOSIS — O3680X0 Pregnancy with inconclusive fetal viability, not applicable or unspecified: Secondary | ICD-10-CM

## 2023-09-06 DIAGNOSIS — Z3A01 Less than 8 weeks gestation of pregnancy: Secondary | ICD-10-CM

## 2023-09-06 LAB — POCT URINE PREGNANCY: Preg Test, Ur: POSITIVE — AB

## 2023-09-06 MED ORDER — ENOXAPARIN SODIUM 60 MG/0.6ML IJ SOSY: 60.0000 mg | PREFILLED_SYRINGE | INTRAMUSCULAR | 6 refills | Status: DC

## 2023-09-06 NOTE — Progress Notes (Signed)
  Subjective:     Patient ID: Stephanie Johns, female   DOB: 04-02-1996, 28 y.o.   MRN: [id number]  HPI Stephanie Johns is a 28 year old white female, married, G3P1011 in for confirmation of pregnancy, has missed period and had 2+HPTs. She is on xarelto for hx of DVT x 2.      Component Value Date/Time   DIAGPAP  02/28/2023 1538    - Negative for intraepithelial lesion or malignancy (NILM)   DIAGPAP  10/23/2019 1108    - Negative for intraepithelial lesion or malignancy (NILM)   DIAGPAP  05/30/2017 0000    NEGATIVE FOR INTRAEPITHELIAL LESIONS OR MALIGNANCY.   HPVHIGH Negative 02/28/2023 1538   ADEQPAP  02/28/2023 1538    Satisfactory for evaluation; transformation zone component PRESENT.   ADEQPAP  10/23/2019 1108    Satisfactory for evaluation; transformation zone component PRESENT.   ADEQPAP  05/30/2017 0000    Satisfactory for evaluation  endocervical/transformation zone component PRESENT.   PCP is Grayling Congress FNP  Review of Systems Missed period, with 2+HPTs Reviewed past medical,surgical, social and family history. Reviewed medications and allergies.     Objective:   Physical Exam BP 132/74 (BP Location: Left Arm, Patient Position: Sitting, Cuff Size: Normal)   Pulse 82   Ht 5\' 6"  (1.676 m)   Wt 210 lb (95.3 kg)   LMP 07/25/2023   BMI 33.89 kg/m  UPT is +, about 6+1 weeks by LMP with EDD 04/30/24 Skin warm and dry. Neck: mid line trachea, normal thyroid, good ROM, no lymphadenopathy noted. Lungs: clear to ausculation bilaterally. Cardiovascular: regular rate and rhythm.    Fall risk is low  Upstream - 09/06/23 0931       Pregnancy Intention Screening   Does the patient want to become pregnant in the next year? N/A    Does the patient's partner want to become pregnant in the next year? N/A    Would the patient like to discuss contraceptive options today? No      Contraception Wrap Up   Current Method Pregnant/Seeking Pregnancy    End Method Pregnant/Seeking Pregnancy     Contraception Counseling Provided No             Assessment:     1. Pregnancy examination or test, positive result (Primary) - POCT urine pregnancy Will check QHCG and progesterone level - Beta hCG quant (ref lab) - Progesterone  2. Less than [redacted] weeks gestation of pregnancy Continue PNV  3. Encounter to determine fetal viability of pregnancy, single or unspecified fetus Return in about 2 weeks  - US OB Comp Less 14 Wks; Future  4. History of DVT of lower extremity Will stop xarelto and rx lovenox 60 mg daily Discussed with Dr Despina Hidden She has used lovenox before when  pregnant     Plan:     Review OB packet

## 2023-09-07 LAB — BETA HCG QUANT (REF LAB): hCG Quant: 16716 m[IU]/mL

## 2023-09-07 LAB — PROGESTERONE: Progesterone: 22.4 ng/mL

## 2023-09-09 ENCOUNTER — Telehealth: Payer: Self-pay

## 2023-09-09 NOTE — Telephone Encounter (Signed)
Patient called and stated that she is bleeding would like for a nurse to call her.

## 2023-09-09 NOTE — Telephone Encounter (Signed)
Returned patient's call.  States she started using Monistat on Saturday and woke up Sunday with light pink spotting and mild cramping. Informed patient, that applicators can agitate the vaginal wall as that may explain her bleeding.  Discuss with Cyril Mourning, NP and patient advised to stop using Monistat to see if bleeding stops. If bleeding continues over the next couple of days, to let us know.  Pt verbalized understanding.

## 2023-09-16 ENCOUNTER — Encounter: Payer: Self-pay | Admitting: Family

## 2023-09-20 ENCOUNTER — Ambulatory Visit: Payer: BC Managed Care – PPO

## 2023-09-20 DIAGNOSIS — Z3481 Encounter for supervision of other normal pregnancy, first trimester: Secondary | ICD-10-CM | POA: Diagnosis not present

## 2023-09-20 DIAGNOSIS — Z3A08 8 weeks gestation of pregnancy: Secondary | ICD-10-CM | POA: Diagnosis not present

## 2023-09-20 DIAGNOSIS — O3680X0 Pregnancy with inconclusive fetal viability, not applicable or unspecified: Secondary | ICD-10-CM

## 2023-09-20 NOTE — Progress Notes (Signed)
 US  8+1 wks,single IUP with yolk sac,FHR 162 bpm,normal ovaries,CRL 14 mm

## 2023-09-24 ENCOUNTER — Telehealth: Payer: Self-pay | Admitting: *Deleted

## 2023-09-24 ENCOUNTER — Other Ambulatory Visit (INDEPENDENT_AMBULATORY_CARE_PROVIDER_SITE_OTHER): Payer: BC Managed Care – PPO

## 2023-09-24 ENCOUNTER — Other Ambulatory Visit (HOSPITAL_COMMUNITY)
Admission: RE | Admit: 2023-09-24 | Discharge: 2023-09-24 | Disposition: A | Discharge status: Home / Self Care | Payer: BC Managed Care – PPO | Source: Ambulatory Visit | Attending: Obstetrics & Gynecology | Admitting: Obstetrics & Gynecology

## 2023-09-24 DIAGNOSIS — O26891 Other specified pregnancy related conditions, first trimester: Secondary | ICD-10-CM

## 2023-09-24 DIAGNOSIS — N898 Other specified noninflammatory disorders of vagina: Secondary | ICD-10-CM | POA: Insufficient documentation

## 2023-09-24 DIAGNOSIS — N9489 Other specified conditions associated with female genital organs and menstrual cycle: Secondary | ICD-10-CM | POA: Diagnosis not present

## 2023-09-24 DIAGNOSIS — Z3A00 Weeks of gestation of pregnancy not specified: Secondary | ICD-10-CM | POA: Diagnosis not present

## 2023-09-24 DIAGNOSIS — Z3A08 8 weeks gestation of pregnancy: Secondary | ICD-10-CM

## 2023-09-24 NOTE — Progress Notes (Signed)
   NURSE VISIT- VAGINITIS  SUBJECTIVE:  Stephanie Johns is a 28 y.o. G3P1011 [redacted]w[redacted]d pregnantfemale here for a vaginal swab for vaginitis screening.  She reports the following symptoms: burning, discharge described as creamy, and "elastic type"  for a couple of days. She had previously used Monistat but stopped using it as it was causing her to bleed.   Denies abnormal vaginal bleeding, significant pelvic pain, fever, or UTI symptoms.  OBJECTIVE:  LMP 07/25/2023   Appears well, in no apparent distress  ASSESSMENT: Vaginal swab for vaginitis screening  PLAN: Self-collected vaginal probe for Gonorrhea, Chlamydia, Trichomonas, Bacterial Vaginosis, Yeast sent to lab Treatment: to be determined once results are received Follow-up as needed if symptoms persist/worsen, or new symptoms develop  Jobe Marker  09/24/2023 3:19 PM

## 2023-09-24 NOTE — Telephone Encounter (Signed)
LMOVM returning patient's call.

## 2023-09-24 NOTE — Telephone Encounter (Signed)
Cream colored discharge with some burning. (Not when using the bathroom-like a uti). Please advise.

## 2023-09-24 NOTE — Telephone Encounter (Signed)
Spoke to patient. States she is having an "elastic type" cream colored discharge.  No odor or irritation but is just "annoying. Advised to come in to have swab to r/o infection.  Pt agreeable to plan and will come this afternoon.

## 2023-09-26 LAB — CERVICOVAGINAL ANCILLARY ONLY
Bacterial Vaginitis (gardnerella): NEGATIVE
Candida Glabrata: POSITIVE — AB
Candida Vaginitis: NEGATIVE
Chlamydia: NEGATIVE
Comment: NEGATIVE
Comment: NEGATIVE
Comment: NEGATIVE
Comment: NEGATIVE
Comment: NEGATIVE
Comment: NORMAL
Neisseria Gonorrhea: NEGATIVE
Trichomonas: NEGATIVE

## 2023-10-14 ENCOUNTER — Encounter: Payer: Self-pay | Admitting: Advanced Practice Midwife

## 2023-10-14 DIAGNOSIS — Z3490 Encounter for supervision of normal pregnancy, unspecified, unspecified trimester: Secondary | ICD-10-CM | POA: Insufficient documentation

## 2023-10-14 DIAGNOSIS — Z98891 History of uterine scar from previous surgery: Secondary | ICD-10-CM | POA: Insufficient documentation

## 2023-10-14 DIAGNOSIS — O0993 Supervision of high risk pregnancy, unspecified, third trimester: Secondary | ICD-10-CM | POA: Insufficient documentation

## 2023-10-17 ENCOUNTER — Other Ambulatory Visit: Payer: Self-pay | Admitting: Obstetrics & Gynecology

## 2023-10-17 DIAGNOSIS — Z3682 Encounter for antenatal screening for nuchal translucency: Secondary | ICD-10-CM

## 2023-10-23 ENCOUNTER — Encounter: Payer: Self-pay | Admitting: Advanced Practice Midwife

## 2023-10-28 ENCOUNTER — Encounter: Payer: Self-pay | Admitting: Advanced Practice Midwife

## 2023-10-28 ENCOUNTER — Ambulatory Visit (INDEPENDENT_AMBULATORY_CARE_PROVIDER_SITE_OTHER): Payer: BC Managed Care – PPO | Admitting: Advanced Practice Midwife

## 2023-10-28 ENCOUNTER — Ambulatory Visit: Payer: BC Managed Care – PPO | Admitting: *Deleted

## 2023-10-28 VITALS — BP 111/78 | HR 91 | Wt 210.0 lb

## 2023-10-28 DIAGNOSIS — Z98891 History of uterine scar from previous surgery: Secondary | ICD-10-CM

## 2023-10-28 DIAGNOSIS — Z6833 Body mass index (BMI) 33.0-33.9, adult: Secondary | ICD-10-CM

## 2023-10-28 DIAGNOSIS — Z86718 Personal history of other venous thrombosis and embolism: Secondary | ICD-10-CM | POA: Diagnosis not present

## 2023-10-28 DIAGNOSIS — E7800 Pure hypercholesterolemia, unspecified: Secondary | ICD-10-CM | POA: Insufficient documentation

## 2023-10-28 DIAGNOSIS — Z131 Encounter for screening for diabetes mellitus: Secondary | ICD-10-CM | POA: Diagnosis not present

## 2023-10-28 DIAGNOSIS — Z3480 Encounter for supervision of other normal pregnancy, unspecified trimester: Secondary | ICD-10-CM | POA: Diagnosis not present

## 2023-10-28 DIAGNOSIS — Z3A13 13 weeks gestation of pregnancy: Secondary | ICD-10-CM | POA: Diagnosis not present

## 2023-10-28 NOTE — Progress Notes (Signed)
 INITIAL OBSTETRICAL VISIT Patient name: Stephanie Johns MRN [id number]  Date of birth: 09-Apr-1996 Chief Complaint:   Initial Prenatal Visit  History of Present Illness:   Stephanie Johns is a 28 y.o. G31P1011  female at [redacted]w[redacted]d by LMP c/w u/s at 7.5 weeks with an Estimated Date of Delivery: 04/30/24 being seen today for her initial obstetrical visit.   Her obstetrical history is significant for CS for breech (had ROM/early labor,no documentation of cx exam); 3 unprovoked DVTs w/neg WU, now on lovenox.   Today she reports no complaints.     10/28/2023    3:52 PM 02/28/2023    3:31 PM 09/28/2021    9:35 AM 10/23/2019   11:07 AM 02/19/2018    3:02 PM  Depression screen PHQ 2/9  Decreased Interest 0 0 0 0 0  Down, Depressed, Hopeless 0 1 2 0 0  PHQ - 2 Score 0 1 2 0 0  Altered sleeping 2 2 2  1   Tired, decreased energy 0 1 0  0  Change in appetite 2 2 2   0  Feeling bad or failure about yourself  1 1 1   0  Trouble concentrating 2 3 1   0  Moving slowly or fidgety/restless 0 0 0  0  Suicidal thoughts 0 0 0  0  PHQ-9 Score 7 10 8  1     Patient's last menstrual period was 07/25/2023. Last pap  Diagnosis  Date Value Ref Range Status  02/28/2023   Final   - Negative for intraepithelial lesion or malignancy (NILM)   Review of Systems:   Pertinent items are noted in HPI Denies cramping/contractions, leakage of fluid, vaginal bleeding, abnormal vaginal discharge w/ itching/odor/irritation, headaches, visual changes, shortness of breath, chest pain, abdominal pain, severe nausea/vomiting, or problems with urination or bowel movements unless otherwise stated above.  Pertinent History Reviewed:  Reviewed past medical,surgical, social, obstetrical and family history.  Reviewed problem list, medications and allergies. OB History  Gravida Para Term Preterm AB Living  3 1 1  1 1   SAB IAB Ectopic Multiple Live Births  1    1    # Outcome Date GA Lbr Len/2nd Weight Sex Type Anes PTL Lv  3  Current           2 Term 09/13/18 [redacted]w[redacted]d  7 lb 15 oz (3.6 kg) M CS-LTranv Spinal  LIV     Birth Comments: ROM/breech  1 SAB            Physical Assessment:   Vitals:   10/28/23 1527  BP: 111/78  Pulse: 91  Weight: 210 lb (95.3 kg)  Body mass index is 33.89 kg/m.       Physical Examination:  General appearance - well appearing, and in no distress  Mental status - alert, oriented to person, place, and time  Psych:  She has a normal mood and affect  Skin - warm and dry, normal color, no suspicious lesions noted  Chest - effort normal  Heart - normal rate and regular rhythm  Abdomen - soft, nontender  Extremities:  No swelling or varicosities noted   No results found for this or any previous visit (from the past 24 hours).      10/28/2023    3:52 PM 02/28/2023    3:31 PM 09/28/2021    9:35 AM 10/23/2019   11:07 AM 02/19/2018    3:02 PM  Depression screen PHQ 2/9  Decreased Interest 0 0 0 0 0  Down, Depressed, Hopeless 0 1 2 0 0  PHQ - 2 Score 0 1 2 0 0  Altered sleeping 2 2 2  1   Tired, decreased energy 0 1 0  0  Change in appetite 2 2 2   0  Feeling bad or failure about yourself  1 1 1   0  Trouble concentrating 2 3 1   0  Moving slowly or fidgety/restless 0 0 0  0  Suicidal thoughts 0 0 0  0  PHQ-9 Score 7 10 8  1         10/28/2023    3:52 PM 02/28/2023    3:31 PM 09/28/2021    9:35 AM  GAD 7 : Generalized Anxiety Score  Nervous, Anxious, on Edge 3 2 2   Control/stop worrying 3 2 2   Worry too much - different things 3 2 2   Trouble relaxing 3 2 2   Restless 3 2 2   Easily annoyed or irritable 3 3 2   Afraid - awful might happen 2 2 1   Total GAD 7 Score 20 15 13       Assessment & Plan:  1) High-Risk Pregnancy G3P1011 at [redacted]w[redacted]d with an Estimated Date of Delivery: 04/30/24   2) Initial OB visit    1. History of cesarean delivery Unsure of TOLAC vs RCS  2. Supervision of other normal pregnancy, antepartum (Primary)  - Urine Culture - Hemoglobin A1c - [facility]  BABYSCRIPTS SCHEDULE OPTIMIZATION - CBC/D/Plt+RPR+Rh+ABO+RubIgG... - GC/Chlamydia Probe Amp  3. [redacted] weeks gestation of pregnancy  - Urine Culture - Hemoglobin A1c - [facility] AMB BABYSCRIPTS SCHEDULE OPTIMIZATION - CBC/D/Plt+RPR+Rh+ABO+RubIgG... - GC/Chlamydia Probe Amp  4. Diabetes mellitus screening  - Hemoglobin A1c  5. BMI 33.0-33.9,adult  - Hemoglobin A1c  6.  Hx DVT:  continue lovenox       Meds: No orders of the defined types were placed in this encounter.   Initial labs obtained Continue prenatal vitamins Reviewed n/v relief measures and warning s/s to report Reviewed recommended weight gain based on pre-gravid BMI Encouraged well-balanced diet Genetic & carrier screening discussed: requests Panorama and AFP, declines NT/IT and Horizon  Ultrasound discussed; fetal survey: requested CCNC completed> form faxed if has or is planning to apply for medicaid The nature of [facility] [employer] with multiple MDs and other Advanced Practice Providers was explained to patient; also emphasized that fellows, residents, and students are part of our team. Has home bp cuff.. Check bp weekly, let us know if >140/90.        Scarlette Calico Cresenzo-Dishmon 4:28 PM

## 2023-10-28 NOTE — Patient Instructions (Signed)
 Stephanie Johns, I greatly value your feedback.  If you receive a survey following your visit with [place] today, we appreciate you taking the time to fill it out.  Thanks, [name], DNP, CNM  [facility] HAS MOVED!!! It is now [facility] ([address] [place], [place] [address]) Entrance located off of [address] Free 24/7 valet parking   Nausea & Vomiting Have saltine crackers or pretzels by your bed and eat a few bites before you raise your head out of bed in the morning Eat small frequent meals throughout the day instead of large meals Drink plenty of fluids throughout the day to stay hydrated, just don't drink a lot of fluids with your meals.  This can make your stomach fill up faster making you feel sick Do not brush your teeth right after you eat Products with real ginger are good for nausea, like ginger ale and ginger hard candy Make sure it says made with real ginger! Sucking on sour candy like lemon heads is also good for nausea If your prenatal vitamins make you nauseated, take them at night so you will sleep through the nausea Sea Bands If you feel like you need medicine for the nausea & vomiting please let us know If you are unable to keep any fluids or food down please let us know   Constipation Drink plenty of fluid, preferably water, throughout the day Eat foods high in fiber such as fruits, vegetables, and grains Exercise, such as walking, is a good way to keep your bowels regular Drink warm fluids, especially warm prune juice, or decaf coffee Eat a 1/2 cup of real oatmeal (not instant), 1/2 cup applesauce, and 1/2-1 cup warm prune juice every day If needed, you may take Colace (docusate sodium) stool softener once or twice a day to help keep the stool soft.  If you still are having problems with constipation, you may take Miralax once daily as needed to help keep your bowels regular.   Home Blood Pressure Monitoring for  Patients   Your provider has recommended that you check your blood pressure (BP) at least once a week at home. If you do not have a blood pressure cuff at home, one will be provided for you. Contact your provider if you have not received your monitor within 1 week.   Helpful Tips for Accurate Home Blood Pressure Checks  Don't smoke, exercise, or drink caffeine 30 minutes before checking your BP Use the restroom before checking your BP (a full bladder can raise your pressure) Relax in a comfortable upright chair Feet on the ground Left arm resting comfortably on a flat surface at the level of your heart Legs uncrossed Back supported Sit quietly and don't talk Place the cuff on your bare arm Adjust snuggly, so that only two fingertips can fit between your skin and the top of the cuff Check 2 readings separated by at least one minute Keep a log of your BP readings For a visual, please reference this diagram: http://ccnc.care/bpdiagram  Provider Name: [facility]     Phone: [phone]  Zone 1: ALL CLEAR  Continue to monitor your symptoms:  BP reading is less than 140 (top number) or less than 90 (bottom number)  No right upper stomach pain No headaches or seeing spots No feeling nauseated or throwing up No swelling in face and hands  Zone 2: CAUTION Call your doctor's office for any of the following:  BP reading is greater than 140 (top number) or greater than 90 (bottom number)  Stomach pain under your ribs in the middle or right side Headaches or seeing spots Feeling nauseated or throwing up Swelling in face and hands  Zone 3: EMERGENCY  Seek immediate medical care if you have any of the following:  BP reading is greater than160 (top number) or greater than 110 (bottom number) Severe headaches not improving with Tylenol Serious difficulty catching your breath Any worsening symptoms from Zone 2    First Trimester of Pregnancy The first trimester of pregnancy is from  week 1 until the end of week 12 (months 1 through 3). A week after a sperm fertilizes an egg, the egg will implant on the wall of the uterus. This embryo will begin to develop into a baby. Genes from you and your partner are forming the baby. The female genes determine whether the baby is a boy or a girl. At 6-8 weeks, the eyes and face are formed, and the heartbeat can be seen on ultrasound. At the end of 12 weeks, all the baby's organs are formed.  Now that you are pregnant, you will want to do everything you can to have a healthy baby. Two of the most important things are to get good prenatal care and to follow your health care provider's instructions. Prenatal care is all the medical care you receive before the baby's birth. This care will help prevent, find, and treat any problems during the pregnancy and childbirth. BODY CHANGES Your body goes through many changes during pregnancy. The changes vary from woman to woman.  You may gain or lose a couple of pounds at first. You may feel sick to your stomach (nauseous) and throw up (vomit). If the vomiting is uncontrollable, call your health care provider. You may tire easily. You may develop headaches that can be relieved by medicines approved by your health care provider. You may urinate more often. Painful urination may mean you have a bladder infection. You may develop heartburn as a result of your pregnancy. You may develop constipation because certain hormones are causing the muscles that push waste through your intestines to slow down. You may develop hemorrhoids or swollen, bulging veins (varicose veins). Your breasts may begin to grow larger and become tender. Your nipples may stick out more, and the tissue that surrounds them (areola) may become darker. Your gums may bleed and may be sensitive to brushing and flossing. Dark spots or blotches (chloasma, mask of pregnancy) may develop on your face. This will likely fade after the baby is  born. Your menstrual periods will stop. You may have a loss of appetite. You may develop cravings for certain kinds of food. You may have changes in your emotions from day to day, such as being excited to be pregnant or being concerned that something may go wrong with the pregnancy and baby. You may have more vivid and strange dreams. You may have changes in your hair. These can include thickening of your hair, rapid growth, and changes in texture. Some women also have hair loss during or after pregnancy, or hair that feels dry or thin. Your hair will most likely return to normal after your baby is born. WHAT TO EXPECT AT YOUR PRENATAL VISITS During a routine prenatal visit: You will be weighed to make sure you and the baby are growing normally. Your blood pressure will be taken. Your abdomen will be measured to track your baby's growth. The fetal  heartbeat will be listened to starting around week 10 or 12 of your pregnancy. Test results from any previous visits will be discussed. Your health care provider may ask you: How you are feeling. If you are feeling the baby move. If you have had any abnormal symptoms, such as leaking fluid, bleeding, severe headaches, or abdominal cramping. If you have any questions. Other tests that may be performed during your first trimester include: Blood tests to find your blood type and to check for the presence of any previous infections. They will also be used to check for low iron levels (anemia) and Rh antibodies. Later in the pregnancy, blood tests for diabetes will be done along with other tests if problems develop. Urine tests to check for infections, diabetes, or protein in the urine. An ultrasound to confirm the proper growth and development of the baby. An amniocentesis to check for possible genetic problems. Fetal screens for spina bifida and Down syndrome. You may need other tests to make sure you and the baby are doing well. HOME CARE  INSTRUCTIONS  Medicines Follow your health care provider's instructions regarding medicine use. Specific medicines may be either safe or unsafe to take during pregnancy. Take your prenatal vitamins as directed. If you develop constipation, try taking a stool softener if your health care provider approves. Diet Eat regular, well-balanced meals. Choose a variety of foods, such as meat or vegetable-based protein, fish, milk and low-fat dairy products, vegetables, fruits, and whole grain breads and cereals. Your health care provider will help you determine the amount of weight gain that is right for you. Avoid raw meat and uncooked cheese. These carry germs that can cause birth defects in the baby. Eating four or five small meals rather than three large meals a day may help relieve nausea and vomiting. If you start to feel nauseous, eating a few soda crackers can be helpful. Drinking liquids between meals instead of during meals also seems to help nausea and vomiting. If you develop constipation, eat more high-fiber foods, such as fresh vegetables or fruit and whole grains. Drink enough fluids to keep your urine clear or pale yellow. Activity and Exercise Exercise only as directed by your health care provider. Exercising will help you: Control your weight. Stay in shape. Be prepared for labor and delivery. Experiencing pain or cramping in the lower abdomen or low back is a good sign that you should stop exercising. Check with your health care provider before continuing normal exercises. Try to avoid standing for long periods of time. Move your legs often if you must stand in one place for a long time. Avoid heavy lifting. Wear low-heeled shoes, and practice good posture. You may continue to have sex unless your health care provider directs you otherwise. Relief of Pain or Discomfort Wear a good support bra for breast tenderness.   Take warm sitz baths to soothe any pain or discomfort caused by  hemorrhoids. Use hemorrhoid cream if your health care provider approves.   Rest with your legs elevated if you have leg cramps or low back pain. If you develop varicose veins in your legs, wear support hose. Elevate your feet for 15 minutes, 3-4 times a day. Limit salt in your diet. Prenatal Care Schedule your prenatal visits by the twelfth week of pregnancy. They are usually scheduled monthly at first, then more often in the last 2 months before delivery. Write down your questions. Take them to your prenatal visits. Keep all your prenatal visits as directed  by your health care provider. Safety Wear your seat belt at all times when driving. Make a list of emergency phone numbers, including numbers for family, friends, the hospital, and police and fire departments. General Tips Ask your health care provider for a referral to a local prenatal education class. Begin classes no later than at the beginning of month 6 of your pregnancy. Ask for help if you have counseling or nutritional needs during pregnancy. Your health care provider can offer advice or refer you to specialists for help with various needs. Do not use hot tubs, steam rooms, or saunas. Do not douche or use tampons or scented sanitary pads. Do not cross your legs for long periods of time. Avoid cat litter boxes and soil used by cats. These carry germs that can cause birth defects in the baby and possibly loss of the fetus by miscarriage or stillbirth. Avoid all smoking, herbs, alcohol, and medicines not prescribed by your health care provider. Chemicals in these affect the formation and growth of the baby. Schedule a dentist appointment. At home, brush your teeth with a soft toothbrush and be gentle when you floss. SEEK MEDICAL CARE IF:  You have dizziness. You have mild pelvic cramps, pelvic pressure, or nagging pain in the abdominal area. You have persistent nausea, vomiting, or diarrhea. You have a bad smelling vaginal  discharge. You have pain with urination. You notice increased swelling in your face, hands, legs, or ankles. SEEK IMMEDIATE MEDICAL CARE IF:  You have a fever. You are leaking fluid from your vagina. You have spotting or bleeding from your vagina. You have severe abdominal cramping or pain. You have rapid weight gain or loss. You vomit blood or material that looks like coffee grounds. You are exposed to [place] measles and have never had them. You are exposed to fifth disease or chickenpox. You develop a severe headache. You have shortness of breath. You have any kind of trauma, such as from a fall or a car accident. Document Released: 07/24/2001 Document Revised: 12/14/2013 Document Reviewed: 06/09/2013 [facility] Patient Information 2015 [place], [place]. This information is not intended to replace advice given to you by your health care provider. Make sure you discuss any questions you have with your health care provider.  ADDITIONAL HEALTHCARE OPTIONS FOR PATIENTS  [facility] / e-Visit: [email or web address]         [facility]: [phone]  [facility]: [phone]                   [facility]: [phone]     Safe Medications in Pregnancy   Acne: Benzoyl Peroxide Salicylic Acid  Backache/Headache: Tylenol: 2 regular strength every 4 hours OR              2 Extra strength every 6 hours  Colds/Coughs/Allergies: Benadryl (alcohol free) 25 mg every 6 hours as needed Breath right strips Claritin Cepacol throat lozenges Chloraseptic throat spray Cold-Eeze- up to three times per day Cough drops, alcohol free Flonase (by prescription only) Guaifenesin Mucinex Robitussin DM (plain only, alcohol free) Saline nasal spray/drops Sudafed (pseudoephedrine) & Actifed ** use only after [redacted] weeks gestation and if you do not have high blood pressure Tylenol Vicks Vaporub Zinc  lozenges Zyrtec   Constipation: Colace Ducolax suppositories Fleet enema Glycerin suppositories Metamucil Milk of magnesia Miralax Senokot Smooth move tea  Diarrhea: Kaopectate Imodium A-D  *NO pepto Bismol  Hemorrhoids: Anusol Anusol HC Preparation H Tucks  Indigestion: Tums Maalox  Mylanta Zantac  Pepcid  Insomnia: Benadryl (alcohol free) 25mg  every 6 hours as needed Tylenol PM Unisom, no Gelcaps  Leg Cramps: Tums MagGel  Nausea/Vomiting:  Bonine Dramamine Emetrol Ginger extract Sea bands Meclizine  Nausea medication to take during pregnancy:  Unisom (doxylamine succinate 25 mg tablets) Take one tablet daily at bedtime. If symptoms are not adequately controlled, the dose can be increased to a maximum recommended dose of two tablets daily (1/2 tablet in the morning, 1/2 tablet mid-afternoon and one at bedtime). Vitamin B6 100mg  tablets. Take one tablet twice a day (up to 200 mg per day).  Skin Rashes: Aveeno products Benadryl cream or 25mg  every 6 hours as needed Calamine Lotion 1% cortisone cream  Yeast infection: Gyne-lotrimin 7 Monistat 7   **If taking multiple medications, please check labels to avoid duplicating the same active ingredients **take medication as directed on the label ** Do not exceed 4000 mg of tylenol in 24 hours **Do not take medications that contain aspirin or ibuprofen   ([phone]) [phone] is the phone number for Pregnancy Classes or hospital tours at [facility].   You will be referred to  [email or web address]   for more information on childbirth classes   At this site you may register for classes. You may sign up for a waiting list if classes are full. Please SIGN UP FOR THIS!.   When the waiting list becomes long, sometimes new classes can be added.  [facility] Call to Register: [phone] or  [phone]   or   Register Online: [email or web address] THESE CLASSES FILL UP VERY QUICKLY, SO SIGN UP AS SOON AS YOU CAN!!! Please visit [facility] pregnancy website at [email or web address]  Childbirth Classes  Option 1: Birth & Baby Series Series of 3 weekly classes, on the same day of the week (can choose Mon-Thurs) from 6-9pm Helps you and your support person prepare for childbirth Reviews newborn care, labor & birth, cesarean birth, pain management, and comfort techniques Cost: $60 per couple for insured or self-pay, $30 per couple for Medicaid  Option 2: Weekend Birth & Baby This class is a weekend version of our Birth & Baby series.  It is designed for parents who have a difficult time fitting several weeks of classes into their schedule.   Covers the care of your newborn and the basics of labor and childbirth Friday 6:30pm-8:30pm Saturday 9am-4pm, includes lunch for you and your partner  Cost: $75 per couple for insured or self-pay, $30 per couple for Medicaid  Option 3: Natural Childbirth This series of 5 weekly classes is for expectant parents who want to learn and practice natural methods of coping with the process of labor and childbirth.  Can choose Mon or Tues, 7-9pm.   Covers relaxation, breathing, massage, visualization, role of the partner, and helpful positioning Participants learn how to be confident in their body's ability to give birth. Class empowers and helps parents make informed decisions about care. Includes discussion that will help new parents transition into the immediate postpartum period.  Cost: $75 per couple for insured or self-pay, $30 per couple for Medicaid  Option 4: Online Birth & Baby This online class offers you the freedom to complete a Birth & Baby series in the comfort of your own home.  The flexibility of this option allows you to review sections at your own pace, at times convenient to you and your support people.  It includes additional  video information, animations, quizzes and extended activities. Get  organized with helpful eClass tools, checklists, and trackers.  Cost: $60 for 60 days of online access                                                                            Other Available Classes  Baby & Me Enjoy this time to discuss newborn & infant parenting topics and family adjustment issues with other new mothers in a relaxed environment. Each week brings a new speaker or baby-centered activity. We encourage mothers and their babies (birth to crawling) to join [place]. You are welcome to visit this group even if you haven't delivered yet! It's wonderful to make new friends early and watch other moms interact with their babies. No registration or fee.  Big Brother/Big Sister Let your children share in the joy of a new brother or sister in this special class designed just for them. Discussion includes how families care for babies: swaddling, holding, diapering, safety, as well as how they can be helpful in their new role. This class is designed for children ages 2 to 12, but any age is welcome. Please register each child individually. $5 Breastfeeding Support Group This group is a mother-to-mother support circle where moms have the opportunity to share their breastfeeding experiences. A Breastfeeding Support nurse is present for questions and concerns. An infant scale is available for weight checks. No fee or registration.  Breastfeeding Your Baby Breastfeeding is a special time for mother and child. This class will help you feel ready to begin this important relationship. Your partner is encouraged to attend with you. Learn what to expect and feel more confident in the first days of breastfeeding your newborn. This class also addresses the most common fears and challenges of breastfeeding during the first few weeks, months, and beyond. $30 per couple Caring for Baby This class is for expectant and adoptive parents who want to  learn and practice the most up-to-date newborn care for their babies. Focus is on birth through first six weeks of life. Topics include feeding, bathing, diapering, crying, umbilical cord care, circumcision care and safe sleep. Parents learn how to recognize symptoms of illness and when to call the pediatrician. Register only the mom-to-be and your partner can plan to come with you. (*Note: This class is included in the Birth & Baby series and the Weekend Birth & Baby classes.) $10 per couple Comfort Techniques & Tour This 2-hour interactive class is designed for those who either do not wish to take the Birth & Baby series or for those who prefer our online childbirth class, but don't want to miss the opportunity to learn and practice hands-on techniques. These skills can help relieve some of the discomfort of labor and encourage your baby to rotate toward the best position for birth. You and your partner will be able to try a variety of labor positions with birth balls and rebozos as well as practice breathing, relaxation, and visual techniques. $20 per couple [employer] [facility] This course offers Dads-to-be the tools and knowledge needed to feel confident on their journey to becoming new fathers. Experienced dads, who have been trained as coaches, teach dads-to-be how to hold, comfort, diapers, swaddle and play with their infant while being  able to support the new mom as well. $25 Grandparent Love Expecting a grandbaby? Learn about the latest infant care and safety recommendations and ways to support your own child as he or she transitions into the parenting role. $10 per person Infant and Child CPR Parents, grandparents, babysitters, and friends learn Cardio-Pulmonary Resuscitation skills for infants and children. You will also learn how to treat both conscious and unconscious choking infants and children. Register each participant individually. (Note: This Family & Friends program does not offer  certification.) $20 per person Marvelous Multiples Expecting twins, triplets, or more? This free 2-hour class covers the differences in labor, birth, parenting, and breastfeeding issues that face multiples' parents.  Maternity Care Center Virtual Tour  Online virtual tour of the new [place] Talk This free mom-led group offers support and connection to mothers as they journey through the adjustments and struggles of that sometimes overwhelming first year after the birth of a child. A member of our staff will be present to share resources and additional support if needed, as you care for yourself and baby. You are welcome to visit this group before you deliver! It's wonderful to meet new friends early and watch other moms interact with their babies.  Waterbirth Class Interested in a waterbirth? This free informational class will help you discover whether waterbirth is the right fit for you and is required if you are planning a waterbirth. Education about waterbirth itself, supplies you may need, and what you may need from your support team is included in this class. Partners are encouraged to come.

## 2023-10-29 ENCOUNTER — Other Ambulatory Visit: Payer: BC Managed Care – PPO

## 2023-10-29 NOTE — Addendum Note (Signed)
 Addended by: Moss Mc on: 10/29/2023 12:31 PM   Modules accepted: Orders

## 2023-10-30 LAB — GC/CHLAMYDIA PROBE AMP
Chlamydia trachomatis, NAA: NEGATIVE
Neisseria Gonorrhoeae by PCR: NEGATIVE

## 2023-10-30 LAB — CBC/D/PLT+RPR+RH+ABO+RUBIGG...
Antibody Screen: NEGATIVE
Basophils Absolute: 0 10*3/uL (ref 0.0–0.2)
Basos: 0 %
EOS (ABSOLUTE): 0 10*3/uL (ref 0.0–0.4)
Eos: 0 %
HCV Ab: NONREACTIVE
Hematocrit: 39.7 % (ref 34.0–46.6)
Hemoglobin: 13.2 g/dL (ref 11.1–15.9)
Hepatitis B Surface Ag: NEGATIVE
Immature Grans (Abs): 0.1 10*3/uL (ref 0.0–0.1)
Immature Granulocytes: 1 %
Lymphocytes Absolute: 2 10*3/uL (ref 0.7–3.1)
Lymphs: 18 %
MCH: 30.1 pg (ref 26.6–33.0)
MCHC: 33.2 g/dL (ref 31.5–35.7)
MCV: 90 fL (ref 79–97)
Monocytes Absolute: 0.8 10*3/uL (ref 0.1–0.9)
Monocytes: 8 %
Neutrophils Absolute: 7.9 10*3/uL — ABNORMAL HIGH (ref 1.4–7.0)
Neutrophils: 73 %
Platelets: 268 10*3/uL (ref 150–450)
RBC: 4.39 x10E6/uL (ref 3.77–5.28)
RDW: 14.2 % (ref 11.7–15.4)
RPR Ser Ql: NONREACTIVE
Rh Factor: POSITIVE
Rubella Antibodies, IGG: 3.38 {index} (ref >0.99)
WBC: 10.9 10*3/uL — ABNORMAL HIGH (ref 3.4–10.8)

## 2023-10-30 LAB — URINE CULTURE

## 2023-10-30 LAB — HEMOGLOBIN A1C
Est. average glucose Bld gHb Est-mCnc: 114 mg/dL
Hgb A1c MFr Bld: 5.6 % (ref 4.8–5.6)

## 2023-10-30 LAB — HCV INTERPRETATION

## 2023-11-05 LAB — PANORAMA PRENATAL TEST FULL PANEL:PANORAMA TEST PLUS 5 ADDITIONAL MICRODELETIONS: FETAL FRACTION: 5.9

## 2023-11-19 ENCOUNTER — Encounter: Payer: Self-pay | Admitting: Women's Health

## 2023-11-19 ENCOUNTER — Ambulatory Visit (INDEPENDENT_AMBULATORY_CARE_PROVIDER_SITE_OTHER): Admitting: Women's Health

## 2023-11-19 VITALS — BP 129/85 | HR 85 | Wt 211.0 lb

## 2023-11-19 DIAGNOSIS — Z3482 Encounter for supervision of other normal pregnancy, second trimester: Secondary | ICD-10-CM

## 2023-11-19 DIAGNOSIS — Z98891 History of uterine scar from previous surgery: Secondary | ICD-10-CM | POA: Diagnosis not present

## 2023-11-19 DIAGNOSIS — Z363 Encounter for antenatal screening for malformations: Secondary | ICD-10-CM | POA: Diagnosis not present

## 2023-11-19 DIAGNOSIS — Z3A16 16 weeks gestation of pregnancy: Secondary | ICD-10-CM

## 2023-11-19 DIAGNOSIS — Z3480 Encounter for supervision of other normal pregnancy, unspecified trimester: Secondary | ICD-10-CM

## 2023-11-19 NOTE — Patient Instructions (Signed)
Stephanie Johns, thank you for choosing our office today! We appreciate the opportunity to meet your healthcare needs. You may receive a short survey by mail, e-mail, or through [employer]. If you are happy with your care we would appreciate if you could take just a few minutes to complete the survey questions. We read all of your comments and take your feedback very seriously. Thank you again for choosing our office.  Center for [employer] Team at [facility] ([address] [place], [place] [address]) Entrance C, located off of [address] Free 24/7 valet parking  Go to [email or web address] to register for FREE online childbirth classes  Call the office [phone]) or go to [facility] if: You begin to severe cramping Your water breaks.  Sometimes it is a big gush of fluid, sometimes it is just a trickle that keeps getting your panties wet or running down your legs You have vaginal bleeding.  It is normal to have a small amount of spotting if your cervix was checked.   [facility] Pediatricians/Family Doctors [facility]): [address] Colette Ribas, [phone]           [facility]: [address], [phone]                [facility]): [address], [phone] (call to ask if accepting patients) [facility] Department: [address], [place], [phone]    [facility] Pediatricians/Family Doctors [facility]): [phone], [phone] [facility]: [address] [place], [phone] [facility]: [address], [phone]  [facility] Doctors  [facility]): [phone] [facility]: [address] [phone]   [facility] Doctors [facility]: [address] [phone]  [facility] Doctors  [facility]: [phone], [phone]  Home Blood Pressure Monitoring for Patients   Your provider has recommended that you check your blood pressure (BP) at least once a week at home. If you do not have a blood pressure cuff at home, one will be provided for you. Contact your provider if you have not received your monitor within 1 week.   Helpful Tips for Accurate Home Blood Pressure Checks  Don't smoke, exercise, or drink caffeine 30 minutes before checking your BP Use the restroom before checking your BP (a full bladder can raise your pressure) Relax in a comfortable upright chair Feet on the ground Left arm resting comfortably on a flat surface at the level of your heart Legs uncrossed Back supported Sit quietly and don't talk Place the cuff on your bare arm Adjust snuggly, so that only two fingertips can fit between your skin and the top of the cuff Check 2 readings separated by at least one minute Keep a log of your BP readings For a visual, please reference this diagram: http://ccnc.care/bpdiagram  Provider Name: [facility]     Phone: [phone]  Zone 1: ALL CLEAR  Continue to monitor your symptoms:  BP reading is less than 140 (top number) or less than 90 (bottom number)  No right upper stomach pain No headaches or seeing spots No feeling nauseated or throwing up No swelling in face and hands  Zone 2: CAUTION Call your doctor's office for any of the following:  BP reading is greater than 140 (top number) or greater than  90 (bottom number)  Stomach pain under your ribs in the middle or right side Headaches or seeing spots Feeling nauseated or throwing up Swelling in face and hands  Zone 3: EMERGENCY  Seek immediate medical care if you have any of the following:  BP reading is greater than160 (top number) or greater than 110 (bottom number) Severe headaches not improving with Tylenol Serious difficulty catching your breath Any worsening symptoms from  Zone 2     Second Trimester of Pregnancy The second trimester is from week 14 through week 27 (months 4 through 6). The second trimester is often a time when you feel your best. Your body has adjusted to being pregnant, and you begin to feel better physically. Usually, morning sickness has lessened or quit completely, you may have more energy, and you may have an increase in appetite. The second trimester is also a time when the fetus is growing rapidly. At the end of the sixth month, the fetus is about 9 inches long and weighs about 1 pounds. You will likely begin to feel the baby move (quickening) between 16 and 20 weeks of pregnancy. Body changes during your second trimester Your body continues to go through many changes during your second trimester. The changes vary from woman to woman. Your weight will continue to increase. You will notice your lower abdomen bulging out. You may begin to get stretch marks on your hips, abdomen, and breasts. You may develop headaches that can be relieved by medicines. The medicines should be approved by your health care provider. You may urinate more often because the fetus is pressing on your bladder. You may develop or continue to have heartburn as a result of your pregnancy. You may develop constipation because certain hormones are causing the muscles that push waste through your intestines to slow down. You may develop hemorrhoids or swollen, bulging veins (varicose veins). You may have back pain. This is caused by: Weight gain. Pregnancy hormones that are relaxing the joints in your pelvis. A shift in weight and the muscles that support your balance. Your breasts will continue to grow and they will continue to become tender. Your gums may bleed and may be sensitive to brushing and flossing. Dark spots or blotches (chloasma, mask of pregnancy) may develop on your face. This will likely fade after the baby is born. A dark line from your belly button to  the pubic area (linea nigra) may appear. This will likely fade after the baby is born. You may have changes in your hair. These can include thickening of your hair, rapid growth, and changes in texture. Some women also have hair loss during or after pregnancy, or hair that feels dry or thin. Your hair will most likely return to normal after your baby is born.  What to expect at prenatal visits During a routine prenatal visit: You will be weighed to make sure you and the fetus are growing normally. Your blood pressure will be taken. Your abdomen will be measured to track your baby's growth. The fetal heartbeat will be listened to. Any test results from the previous visit will be discussed.  Your health care provider may ask you: How you are feeling. If you are feeling the baby move. If you have had any abnormal symptoms, such as leaking fluid, bleeding, severe headaches, or abdominal cramping. If you are using any tobacco products, including cigarettes, chewing tobacco, and electronic cigarettes. If you have any questions.  Other tests that may be performed during  your second trimester include: Blood tests that check for: Low iron levels (anemia). High blood sugar that affects pregnant women (gestational diabetes) between 24 and 28 weeks. Rh antibodies. This is to check for a protein on red blood cells (Rh factor). Urine tests to check for infections, diabetes, or protein in the urine. An ultrasound to confirm the proper growth and development of the baby. An amniocentesis to check for possible genetic problems. Fetal screens for spina bifida and Down syndrome. HIV (human immunodeficiency virus) testing. Routine prenatal testing includes screening for HIV, unless you choose not to have this test.  Follow these instructions at home: Medicines Follow your health care provider's instructions regarding medicine use. Specific medicines may be either safe or unsafe to take during  pregnancy. Take a prenatal vitamin that contains at least 600 micrograms (mcg) of folic acid. If you develop constipation, try taking a stool softener if your health care provider approves. Eating and drinking Eat a balanced diet that includes fresh fruits and vegetables, whole grains, good sources of protein such as meat, eggs, or tofu, and low-fat dairy. Your health care provider will help you determine the amount of weight gain that is right for you. Avoid raw meat and uncooked cheese. These carry germs that can cause birth defects in the baby. If you have low calcium intake from food, talk to your health care provider about whether you should take a daily calcium supplement. Limit foods that are high in fat and processed sugars, such as fried and sweet foods. To prevent constipation: Drink enough fluid to keep your urine clear or pale yellow. Eat foods that are high in fiber, such as fresh fruits and vegetables, whole grains, and beans. Activity Exercise only as directed by your health care provider. Most women can continue their usual exercise routine during pregnancy. Try to exercise for 30 minutes at least 5 days a week. Stop exercising if you experience uterine contractions. Avoid heavy lifting, wear low heel shoes, and practice good posture. A sexual relationship may be continued unless your health care provider directs you otherwise. Relieving pain and discomfort Wear a good support bra to prevent discomfort from breast tenderness. Take warm sitz baths to soothe any pain or discomfort caused by hemorrhoids. Use hemorrhoid cream if your health care provider approves. Rest with your legs elevated if you have leg cramps or low back pain. If you develop varicose veins, wear support hose. Elevate your feet for 15 minutes, 3-4 times a day. Limit salt in your diet. Prenatal Care Write down your questions. Take them to your prenatal visits. Keep all your prenatal visits as told by your health  care provider. This is important. Safety Wear your seat belt at all times when driving. Make a list of emergency phone numbers, including numbers for family, friends, the hospital, and police and fire departments. General instructions Ask your health care provider for a referral to a local prenatal education class. Begin classes no later than the beginning of month 6 of your pregnancy. Ask for help if you have counseling or nutritional needs during pregnancy. Your health care provider can offer advice or refer you to specialists for help with various needs. Do not use hot tubs, steam rooms, or saunas. Do not douche or use tampons or scented sanitary pads. Do not cross your legs for long periods of time. Avoid cat litter boxes and soil used by cats. These carry germs that can cause birth defects in the baby and possibly loss of the  fetus by miscarriage or stillbirth. Avoid all smoking, herbs, alcohol, and unprescribed drugs. Chemicals in these products can affect the formation and growth of the baby. Do not use any products that contain nicotine or tobacco, such as cigarettes and e-cigarettes. If you need help quitting, ask your health care provider. Visit your dentist if you have not gone yet during your pregnancy. Use a soft toothbrush to brush your teeth and be gentle when you floss. Contact a health care provider if: You have dizziness. You have mild pelvic cramps, pelvic pressure, or nagging pain in the abdominal area. You have persistent nausea, vomiting, or diarrhea. You have a bad smelling vaginal discharge. You have pain when you urinate. Get help right away if: You have a fever. You are leaking fluid from your vagina. You have spotting or bleeding from your vagina. You have severe abdominal cramping or pain. You have rapid weight gain or weight loss. You have shortness of breath with chest pain. You notice sudden or extreme swelling of your face, hands, ankles, feet, or legs. You  have not felt your baby move in over an hour. You have severe headaches that do not go away when you take medicine. You have vision changes. Summary The second trimester is from week 14 through week 27 (months 4 through 6). It is also a time when the fetus is growing rapidly. Your body goes through many changes during pregnancy. The changes vary from woman to woman. Avoid all smoking, herbs, alcohol, and unprescribed drugs. These chemicals affect the formation and growth your baby. Do not use any tobacco products, such as cigarettes, chewing tobacco, and e-cigarettes. If you need help quitting, ask your health care provider. Contact your health care provider if you have any questions. Keep all prenatal visits as told by your health care provider. This is important. This information is not intended to replace advice given to you by your health care provider. Make sure you discuss any questions you have with your health care provider. Document Released: 07/24/2001 Document Revised: 01/05/2016 Document Reviewed: 09/30/2012 Elsevier Interactive Patient Education  2017 [employer].

## 2023-11-19 NOTE — Progress Notes (Signed)
 LOW-RISK PREGNANCY VISIT Patient name: Stephanie Johns MRN [id number]  Date of birth: [birth year] Chief Complaint:   Routine Prenatal Visit (Having heart flutters)  History of Present Illness:   Stephanie Johns is a 28 y.o. G5P1011 female at [redacted]w[redacted]d with an Estimated Date of Delivery: 04/30/24 being seen today for ongoing management of a low-risk pregnancy.   Today she reports  heart flutters, happened last pregnancy too. A little more today. No caffeine. No sob/cp . Contractions: Not present. Vag. Bleeding: None.  Movement: Present. denies leaking of fluid.     10/28/2023    3:52 PM 02/28/2023    3:31 PM 09/28/2021    9:35 AM 10/23/2019   11:07 AM 02/19/2018    3:02 PM  Depression screen PHQ 2/9  Decreased Interest 0 0 0 0 0  Down, Depressed, Hopeless 0 1 2 0 0  PHQ - 2 Score 0 1 2 0 0  Altered sleeping 2 2 2  1   Tired, decreased energy 0 1 0  0  Change in appetite 2 2 2   0  Feeling bad or failure about yourself  1 1 1   0  Trouble concentrating 2 3 1   0  Moving slowly or fidgety/restless 0 0 0  0  Suicidal thoughts 0 0 0  0  PHQ-9 Score 7 10 8  1         10/28/2023    3:52 PM 02/28/2023    3:31 PM 09/28/2021    9:35 AM  GAD 7 : Generalized Anxiety Score  Nervous, Anxious, on Edge 3 2 2   Control/stop worrying 3 2 2   Worry too much - different things 3 2 2   Trouble relaxing 3 2 2   Restless 3 2 2   Easily annoyed or irritable 3 3 2   Afraid - awful might happen 2 2 1   Total GAD 7 Score 20 15 13       Review of Systems:   Pertinent items are noted in HPI Denies abnormal vaginal discharge w/ itching/odor/irritation, headaches, visual changes, shortness of breath, chest pain, abdominal pain, severe nausea/vomiting, or problems with urination or bowel movements unless otherwise stated above. Pertinent History Reviewed:  Reviewed past medical,surgical, social, obstetrical and family history.  Reviewed problem list, medications and allergies. Physical Assessment:   Vitals:    11/19/23 1612  BP: 129/85  Pulse: 85  Weight: 211 lb (95.7 kg)  Body mass index is 34.06 kg/m.        Physical Examination:   General appearance: Well appearing, and in no distress  Mental status: Alert, oriented to person, place, and time  Skin: Warm & dry  Cardiovascular: Normal heart rate noted, RRR  Respiratory: Normal respiratory effort, no distress, LCTAB  Abdomen: Soft, gravid, nontender  Pelvic: Cervical exam deferred         Extremities: Edema: None  Fetal Status: Fetal Heart Rate (bpm): 152   Movement: Present    Chaperone: N/A No results found for this or any previous visit (from the past 24 hours).  Assessment & Plan:  1) Low-risk pregnancy G3P1011 at [redacted]w[redacted]d with an Estimated Date of Delivery: 04/30/24   2) H/O DVT x 3, on lovenox  3) Prev c/s> gave TOLAC consent to review  4) Flutters> happened w/ last pregnancy too. Keep a log, if more frequent/persistent/sx, let us know. Reviewed warning s/s, reasons to seek care   Meds: No orders of the defined types were placed in this encounter.  Labs/procedures today: AFP  Plan:  Continue routine obstetrical care  Next visit: prefers will be in person for u/s     Reviewed: Preterm labor symptoms and general obstetric precautions including but not limited to vaginal bleeding, contractions, leaking of fluid and fetal movement were reviewed in detail with the patient.  All questions were answered. Does have home bp cuff. Office bp cuff given: not applicable. Check bp weekly, let us know if consistently >140 and/or >90.  Follow-up: Return for As scheduled.  Future Appointments  Date Time Provider Department Center  12/11/2023  3:00 PM [facility] None  12/11/2023  3:50 PM Grace Bushy, Merlene Laughter, CNM [facility]    Orders Placed This Encounter  Procedures   US OB Comp + 14 Wk   AFP, Serum, Open Spina Bifida   Cheral Marker CNM, [facility] 11/19/2023 4:39 PM

## 2023-11-20 ENCOUNTER — Ambulatory Visit

## 2023-11-20 ENCOUNTER — Ambulatory Visit (INDEPENDENT_AMBULATORY_CARE_PROVIDER_SITE_OTHER): Admitting: Family

## 2023-11-20 ENCOUNTER — Encounter: Payer: Self-pay | Admitting: Family

## 2023-11-20 VITALS — BP 110/72 | HR 98 | Wt 211.0 lb

## 2023-11-20 DIAGNOSIS — R002 Palpitations: Secondary | ICD-10-CM | POA: Diagnosis not present

## 2023-11-20 DIAGNOSIS — R5383 Other fatigue: Secondary | ICD-10-CM

## 2023-11-20 DIAGNOSIS — Z0130 Encounter for examination of blood pressure without abnormal findings: Secondary | ICD-10-CM

## 2023-11-20 NOTE — Assessment & Plan Note (Signed)
 EKG in office today WNL.  Holter placed in office today.  EKG showed frequent PVC's.   Also checking CMP, TIBC/Iron, and CBC today.  Will call patient with results as they are available.

## 2023-11-20 NOTE — Progress Notes (Signed)
 Acute Office Visit  Subjective:     Patient ID: Stephanie Johns, female    DOB: Jul 11, 1996, 28 y.o.   MRN: [id number]  Patient is in today for  Chief Complaint  Patient presents with   Palpitations    Palpitations  This is a recurrent problem. The current episode started 1 to 4 weeks ago. The problem occurs constantly. The problem has been waxing and waning. The symptoms are aggravated by stress (position: right side lying is worse, though it does occur at other times as well). Associated symptoms include an irregular heartbeat.     Review of Systems  Cardiovascular:  Positive for palpitations.        Objective:    BP 110/72   Pulse 98   Wt 211 lb (95.7 kg)   LMP 07/25/2023   SpO2 97%   BMI 34.06 kg/m   Physical Exam  No results found for any visits on 11/20/23.  Recent Results (from the past 2160 hours)  POCT urine pregnancy     Status: Abnormal   Collection Time: 09/06/23  9:28 AM  Result Value Ref Range   Preg Test, Ur Positive (A) Negative  Beta hCG quant (ref lab)     Status: None   Collection Time: 09/06/23 10:05 AM  Result Value Ref Range   hCG Quant 16,716 mIU/mL    Comment:                      Female (Non-pregnant)    0 -     5                             (Postmenopausal)  0 -     8                      Female (Pregnant)                      Weeks of Gestation                              3                6 -    71                              4               10 -   750                              5              217 -  7138                              6              158 - 31795                              7             3697 -660630  8            32065 -149571                              9            63803 -151410                             10            46509 -409811                             12            27832 -210612                             14            13950 - 62530                             15             12039 - 70971                             16             9040 - 56451                             17             8175 - 55868                             18             [phone] Results  confirmed on dilution. Roche ECLIA methodology   Progesterone     Status: None   Collection Time: 09/06/23 10:05 AM  Result Value Ref Range   Progesterone 22.4 ng/mL    Comment:                      Follicular phase       0.1 -   0.9                      Luteal phase           1.8 -  23.9                      Ovulation phase        0.1 -  12.0                      Pregnant                         First trimester    11.0 -  44.3                         Second trimester   25.4 -  83.3  Third trimester    58.7 - 214.0                      Postmenopausal         0.0 -   0.1   Cervicovaginal ancillary only     Status: Abnormal   Collection Time: 09/24/23  3:18 PM  Result Value Ref Range   Neisseria Gonorrhea Negative    Chlamydia Negative    Trichomonas Negative    Bacterial Vaginitis (gardnerella) Negative    Candida Vaginitis Negative    Candida Glabrata Positive (A)    Comment      Normal Reference Range Bacterial Vaginosis - Negative   Comment Normal Reference Range Candida Species - Negative    Comment Normal Reference Range Candida Galbrata - Negative    Comment Normal Reference Range Trichomonas - Negative    Comment Normal Reference Ranger Chlamydia - Negative    Comment      Normal Reference Range Neisseria Gonorrhea - Negative  PANORAMA PRENATAL TEST     Status: None   Collection Time: 10/28/23 12:00 AM  Result Value Ref Range   REPORT SUMMARY LOW RISK     Comment: LOW RISK   REPORT NOTE See Notes    GENDER OF FETUS Female    FETAL FRACTION 5.9%    TRISOMY 21 RESULT TEXT Low Risk    TRISOMY 21 AGE-BASED RISK TEXT 1/946 (0.11%)    TRISOMY 21 RISK SCORE TEXT <1/10,000 (<0.01%)    TRISOMY 18 RESULT TEXT Low Risk    TRISOMY 18 AGE-BASED RISK TEXT  1/2,200 (0.05%)    TRISOMY 18 RISK SCORE TEXT <1/10,000 (<0.01%)    TRISOMY 13 RESULT TEXT Low Risk    TRISOMY 13 AGE-BASED RISK TEXT 1/6,930 (0.01%)    TRISOMY 13 RISK SCORE TEXT <1/10,000 (<0.01%)    MONOSOMY X RESULT TEXT Low Risk    MONOSOMY X AGE-BASED RISK TEXT 1/255 (0.39%)    MONOSOMY X RISK SCORE TEXT <1/10,000 (<0.01%)    TRIPLOIDY RESULT TEXT Low Risk    22Q11.2 DELETION SYNDROME RESULT TEXT Low Risk    22Q11.2 DELETION SYNDROME POPULATION-BASED RISK TEXT 1/2,000    22Q11.2 DELETION SYNDROME RISK SCORE TEXT 1/3,100    FOOTNOTES See Notes     Comment: Testing Methodology DNA isolated from maternal blood, which contains placental DNA, is amplified at specific loci using a targeted PCR assay and is sequenced using a high-throughput sequencer. Fetal fraction is determined using a proprietary algorithm incorporating data from single nucleotide polymorphism-based (SNP-based) next-generation sequencing [Pergament E et al. Obstet Gynecol. 2014 Aug;124(2 Pt 1):210-8]. If there is sufficient fetal fraction, sequencing data is analyzed using a proprietary SNP-based algorithm to determine the fetal copy number for chromosomes 13, 18, 21, X and Y. If ordered, specific microdeletions will be evaluated using similar methodology [Wapner RJ et al. Am J Obstet Gynecol. 2015 Mar;212(3):332.e1-9]. If the fetal fraction is insufficient, fetal signal enhancement and/or an additional algorithm to determine whether there is an increased risk for triploidy, trisomy 107, and trisomy 13 may be utilized Verlon Au et al. Ultrasound Obstet Gynecol 2019;  53:73-79]. However, some  samples will not produce a result due to failure to meet the necessary quality thresholds.  This test has been validated on women with a singleton, twin or egg donor pregnancy of at least nine weeks gestation. A result will not be available for higher order multiples and multiple gestation pregnancies with an egg donor or surrogate, or  bone  marrow transplant recipients. Complete test panel is not available for twin gestations and pregnancies achieved with an egg donor or surrogate. For twin pregnancies with a fetal fraction value below the threshold for analysis, a sum of the fetal fractions for both twins will be reported. As this assay is a screening test and not diagnostic, false positives and false negatives can occur. High risk test results need diagnostic confirmation by alternative testing methods. Low risk results do not fully exclude the diagnosis of any of the syndromes nor do they exclude the possibility of other chromosomal abnormalities or  birth defects, which are not a part of this test. Potential sources of inaccurate results include, but are not limited to,  mosaicism, low fetal fraction, limitations of current diagnostic techniques, or misidentification of samples. This test will not identify all deletions associated with each microdeletion syndrome. This test has been validated for deletions  > = 0.5 Mb within the 22q11.2 A-D region. This test has been validated on full region deletions only for 1p36 deletion syndrome, Cri-du-chat syndrome, Prader Willi syndrome and Angelman syndrome and may be unable to detect smaller deletions. Microdeletion risk score may be dependent upon fetal fraction, as deletions on the maternally inherited copy are difficult to identify at lower fetal fractions. Test results should always be interpreted by a clinician in the context of clinical and familial data with the availability of genetic counseling when appropriate. ---------------- Disclaimers The extraction, Health visitor, and sequencing of this test were performed by [facility] [address], [place], [place] [address] (CLIA [place] [id number]). The data analysis and reporting of this test were performed by [facility] [address], [place], [place] [address] (CLIA [place] [id number]). The performance characteristics  of this test were developed by [facility]CLIA ID [id number]). This test has not been cleared or approved by the U.S. Food and Drug Administration (FDA). These laboratories are regulated under CLIA as qualified to perform high-complexity testing.  2021 [employer] All Rights Reserved. Please refer to the attached PDF report Reviewed By: Haze Boyden, M.D., Ph.D., [facility], Senior Laboratory Director CLIA Lab Director: Arnoldo Morale, Ph.D., [facility] IF THE ORDERING PROVIDER HAS QUESTIONS OR WISHES TO DISCUSS THE RESULTS, PLEASE CONTACT us AT [phone] #[phone]. Ask for the NIPT genetic counselor on call.   Hemoglobin A1c     Status: None   Collection Time: 10/28/23  4:34 PM  Result Value Ref Range   Hgb A1c MFr Bld 5.6 4.8 - 5.6 %    Comment:          Prediabetes: 5.7 - 6.4          Diabetes: >6.4          Glycemic control for adults with diabetes: <7.0    Est. average glucose Bld gHb Est-mCnc 114 mg/dL  CBC/D/Plt+RPR+Rh+ABO+RubIgG...     Status: Abnormal   Collection Time: 10/28/23  4:34 PM  Result Value Ref Range   Hepatitis B Surface Ag Negative Negative   HCV Ab Non Reactive Non Reactive   RPR Ser Ql Non Reactive Non Reactive   Rubella Antibodies, IGG 3.38 Immune >0.99 index    Comment:                                 Non-immune       <0.90  Equivocal  0.90 - 0.99                                 Immune           >0.99    ABO Grouping A    Rh Factor Positive     Comment: Please note: Prior records for this patient's ABO / Rh type are not available for additional verification.    Antibody Screen Negative Negative   HIV Screen 4th Generation wRfx Non Reactive Non Reactive    Comment: HIV-1/HIV-2 antibodies and HIV-1 p24 antigen were NOT detected. There is no laboratory evidence of HIV infection. HIV Negative    WBC 10.9 (H) 3.4 - 10.8 x10E3/uL   RBC 4.39 3.77 - 5.28 x10E6/uL   Hemoglobin 13.2 11.1 - 15.9 g/dL   Hematocrit 78.2 95.6 - 46.6 %   MCV 90 79  - 97 fL   MCH 30.1 26.6 - 33.0 pg   MCHC 33.2 31.5 - 35.7 g/dL   RDW 21.3 08.6 - 57.8 %   Platelets 268 150 - 450 x10E3/uL   Neutrophils 73 Not Estab. %   Lymphs 18 Not Estab. %   Monocytes 8 Not Estab. %   Eos 0 Not Estab. %   Basos 0 Not Estab. %   Neutrophils Absolute 7.9 (H) 1.4 - 7.0 x10E3/uL   Lymphocytes Absolute 2.0 0.7 - 3.1 x10E3/uL   Monocytes Absolute 0.8 0.1 - 0.9 x10E3/uL   EOS (ABSOLUTE) 0.0 0.0 - 0.4 x10E3/uL   Basophils Absolute 0.0 0.0 - 0.2 x10E3/uL   Immature Granulocytes 1 Not Estab. %   Immature Grans (Abs) 0.1 0.0 - 0.1 x10E3/uL  Interpretation:     Status: None   Collection Time: 10/28/23  4:34 PM  Result Value Ref Range   HCV Interp 1: Comment     Comment: Not infected with HCV unless early or acute infection is suspected (which may be delayed in an immunocompromised individual), or other evidence exists to indicate HCV infection.   Urine Culture     Status: None   Collection Time: 10/28/23  4:40 PM   Specimen: Urine   UR  Result Value Ref Range   Urine Culture, Routine Final report    Organism ID, Bacteria Comment     Comment: Mixed urogenital flora Less than 10,000 colonies/mL   GC/Chlamydia Probe Amp     Status: None   Collection Time: 10/28/23  4:40 PM   Specimen: Urine   UR  Result Value Ref Range   Chlamydia trachomatis, NAA Negative Negative   Neisseria Gonorrhoeae by PCR Negative Negative    Allergies as of 11/20/2023   No Known Allergies      Medication List        Accurate as of November 20, 2023  6:25 PM. If you have any questions, ask your nurse or doctor.          enoxaparin 60 MG/0.6ML injection Commonly known as: LOVENOX Inject 0.6 mLs (60 mg total) into the skin daily.   PRENATAL VITAMIN PO Take by mouth.            Assessment & Plan:   Problem List Items Addressed This Visit       Other   Palpitations - Primary   EKG in office today WNL.  Holter placed in office today.  EKG showed frequent PVC's.    Also checking CMP, TIBC/Iron, and CBC  today.  Will call patient with results as they are available.       Relevant Orders   CMP14+EGFR   Iron, TIBC and Ferritin Panel   CBC with Diff   CK total and CKMB (cardiac)not at [facility]   AMBULATORY REFERRAL FOR HOLTER MONITORING   LONG TERM MONITOR-LIVE TELEMETRY (3-14 DAYS)   EKG 12-Lead   Other Visit Diagnoses       Other fatigue       Relevant Orders   CMP14+EGFR   Iron, TIBC and Ferritin Panel   CBC with Diff        Return to be determined based on results.  Total time spent: 20 minutes  Miki Kins, FNP  11/20/2023   This document may have been prepared by [facility] Voice Recognition software and as such may include unintentional dictation errors.

## 2023-11-21 ENCOUNTER — Encounter: Payer: Self-pay | Admitting: Women's Health

## 2023-11-21 LAB — CMP14+EGFR
ALT: 18 IU/L (ref 0–32)
AST: 18 IU/L (ref 0–40)
Albumin: 4.1 g/dL (ref 4.0–5.0)
Alkaline Phosphatase: 60 IU/L (ref 44–121)
BUN/Creatinine Ratio: 13 (ref 9–23)
BUN: 7 mg/dL (ref 6–20)
Bilirubin Total: <0.2 mg/dL (ref 0.0–1.2)
CO2: 15 mmol/L — ABNORMAL LOW (ref 20–29)
Calcium: 9.4 mg/dL (ref 8.7–10.2)
Chloride: 104 mmol/L (ref 96–106)
Creatinine, Ser: 0.52 mg/dL — ABNORMAL LOW (ref 0.57–1.00)
Globulin, Total: 2.7 g/dL (ref 1.5–4.5)
Glucose: 76 mg/dL (ref 70–99)
Potassium: 4.3 mmol/L (ref 3.5–5.2)
Sodium: 139 mmol/L (ref 134–144)
Total Protein: 6.8 g/dL (ref 6.0–8.5)
eGFR: 131 mL/min/{1.73_m2} (ref >59)

## 2023-11-21 LAB — AFP, SERUM, OPEN SPINA BIFIDA
AFP MoM: 0.83
AFP Value: 23.7 ng/mL
Gest. Age on Collection Date: 16.5 wk
Maternal Age At EDD: 28 a
OSBR Risk 1 IN: 10000
Test Results:: NEGATIVE
Weight: 211 [lb_av]

## 2023-11-21 LAB — CBC WITH DIFFERENTIAL/PLATELET
Basophils Absolute: 0 10*3/uL (ref 0.0–0.2)
Basos: 0 %
EOS (ABSOLUTE): 0 10*3/uL (ref 0.0–0.4)
Eos: 0 %
Hematocrit: 37.9 % (ref 34.0–46.6)
Hemoglobin: 12.6 g/dL (ref 11.1–15.9)
Immature Grans (Abs): 0.2 10*3/uL — ABNORMAL HIGH (ref 0.0–0.1)
Immature Granulocytes: 1 %
Lymphocytes Absolute: 1.7 10*3/uL (ref 0.7–3.1)
Lymphs: 14 %
MCH: 30.4 pg (ref 26.6–33.0)
MCHC: 33.2 g/dL (ref 31.5–35.7)
MCV: 92 fL (ref 79–97)
Monocytes Absolute: 0.9 10*3/uL (ref 0.1–0.9)
Monocytes: 7 %
Neutrophils Absolute: 9.4 10*3/uL — ABNORMAL HIGH (ref 1.4–7.0)
Neutrophils: 78 %
Platelets: 261 10*3/uL (ref 150–450)
RBC: 4.14 x10E6/uL (ref 3.77–5.28)
RDW: 14.3 % (ref 11.7–15.4)
WBC: 12.1 10*3/uL — ABNORMAL HIGH (ref 3.4–10.8)

## 2023-11-21 LAB — IRON,TIBC AND FERRITIN PANEL
Ferritin: 40 ng/mL (ref 15–150)
Iron Saturation: 16 % (ref 15–55)
Iron: 66 ug/dL (ref 27–159)
Total Iron Binding Capacity: 406 ug/dL (ref 250–450)
UIBC: 340 ug/dL (ref 131–425)

## 2023-11-22 NOTE — Progress Notes (Signed)
 Patient notified

## 2023-12-02 ENCOUNTER — Encounter: Payer: Self-pay | Admitting: Women's Health

## 2023-12-04 DIAGNOSIS — R002 Palpitations: Secondary | ICD-10-CM | POA: Diagnosis not present

## 2023-12-09 ENCOUNTER — Other Ambulatory Visit: Payer: Self-pay | Admitting: Women's Health

## 2023-12-09 DIAGNOSIS — O34219 Maternal care for unspecified type scar from previous cesarean delivery: Secondary | ICD-10-CM

## 2023-12-09 DIAGNOSIS — Z3480 Encounter for supervision of other normal pregnancy, unspecified trimester: Secondary | ICD-10-CM

## 2023-12-09 DIAGNOSIS — Z86718 Personal history of other venous thrombosis and embolism: Secondary | ICD-10-CM

## 2023-12-09 DIAGNOSIS — Z3482 Encounter for supervision of other normal pregnancy, second trimester: Secondary | ICD-10-CM

## 2023-12-09 DIAGNOSIS — Z3A16 16 weeks gestation of pregnancy: Secondary | ICD-10-CM

## 2023-12-09 DIAGNOSIS — Z98891 History of uterine scar from previous surgery: Secondary | ICD-10-CM

## 2023-12-09 DIAGNOSIS — Z363 Encounter for antenatal screening for malformations: Secondary | ICD-10-CM

## 2023-12-11 ENCOUNTER — Ambulatory Visit: Admitting: Radiology

## 2023-12-11 ENCOUNTER — Encounter: Payer: Self-pay | Admitting: Women's Health

## 2023-12-11 ENCOUNTER — Ambulatory Visit: Admitting: Women's Health

## 2023-12-11 VITALS — BP 108/74 | HR 93 | Wt 211.0 lb

## 2023-12-11 DIAGNOSIS — Z86718 Personal history of other venous thrombosis and embolism: Secondary | ICD-10-CM | POA: Diagnosis not present

## 2023-12-11 DIAGNOSIS — Z362 Encounter for other antenatal screening follow-up: Secondary | ICD-10-CM | POA: Diagnosis not present

## 2023-12-11 DIAGNOSIS — Z363 Encounter for antenatal screening for malformations: Secondary | ICD-10-CM | POA: Diagnosis not present

## 2023-12-11 DIAGNOSIS — O34219 Maternal care for unspecified type scar from previous cesarean delivery: Secondary | ICD-10-CM

## 2023-12-11 DIAGNOSIS — Z8759 Personal history of other complications of pregnancy, childbirth and the puerperium: Secondary | ICD-10-CM

## 2023-12-11 DIAGNOSIS — Z3A19 19 weeks gestation of pregnancy: Secondary | ICD-10-CM

## 2023-12-11 DIAGNOSIS — Z3482 Encounter for supervision of other normal pregnancy, second trimester: Secondary | ICD-10-CM | POA: Diagnosis not present

## 2023-12-11 DIAGNOSIS — Z3480 Encounter for supervision of other normal pregnancy, unspecified trimester: Secondary | ICD-10-CM

## 2023-12-11 NOTE — Progress Notes (Signed)
 LOW-RISK PREGNANCY VISIT Patient name: Stephanie Johns MRN [id number]  Date of birth: [birth year] Chief Complaint:   Routine Prenatal Visit  History of Present Illness:   Stephanie Johns is a 28 y.o. G18P1011 female at [redacted]w[redacted]d with an Estimated Date of Delivery: 04/30/24 being seen today for ongoing management of a low-risk pregnancy.   Today she reports no complaints. Contractions: Not present. Vag. Bleeding: None.  Movement: Present. denies leaking of fluid.     10/28/2023    3:52 PM 02/28/2023    3:31 PM 09/28/2021    9:35 AM 10/23/2019   11:07 AM 02/19/2018    3:02 PM  Depression screen PHQ 2/9  Decreased Interest 0 0 0 0 0  Down, Depressed, Hopeless 0 1 2 0 0  PHQ - 2 Score 0 1 2 0 0  Altered sleeping 2 2 2  1   Tired, decreased energy 0 1 0  0  Change in appetite 2 2 2   0  Feeling bad or failure about yourself  1 1 1   0  Trouble concentrating 2 3 1   0  Moving slowly or fidgety/restless 0 0 0  0  Suicidal thoughts 0 0 0  0  PHQ-9 Score 7 10 8  1         10/28/2023    3:52 PM 02/28/2023    3:31 PM 09/28/2021    9:35 AM  GAD 7 : Generalized Anxiety Score  Nervous, Anxious, on Edge 3 2 2   Control/stop worrying 3 2 2   Worry too much - different things 3 2 2   Trouble relaxing 3 2 2   Restless 3 2 2   Easily annoyed or irritable 3 3 2   Afraid - awful might happen 2 2 1   Total GAD 7 Score 20 15 13       Review of Systems:   Pertinent items are noted in HPI Denies abnormal vaginal discharge w/ itching/odor/irritation, headaches, visual changes, shortness of breath, chest pain, abdominal pain, severe nausea/vomiting, or problems with urination or bowel movements unless otherwise stated above. Pertinent History Reviewed:  Reviewed past medical,surgical, social, obstetrical and family history.  Reviewed problem list, medications and allergies. Physical Assessment:   Vitals:   12/11/23 1539  BP: 108/74  Pulse: 93  Weight: 211 lb (95.7 kg)  Body mass index is 34.06  kg/m.        Physical Examination:   General appearance: Well appearing, and in no distress  Mental status: Alert, oriented to person, place, and time  Skin: Warm & dry  Cardiovascular: Normal heart rate noted  Respiratory: Normal respiratory effort, no distress  Abdomen: Soft, gravid, nontender  Pelvic: Cervical exam deferred         Extremities:    Fetal Status:     Movement: Present   US : GA = 19+6 weeks Single active fetus, complete breech, FHR = 157 bpm, anterior pl. Gr1, MVP = 4.9 cm Cardiac screen incomplete, need 4 ch view/RVOT/LVOT/3VTV EFW 31% 302g  nl ov's, CL = 4.1 cm - closed  Chaperone: N/A No results found for this or any previous visit (from the past 24 hours).  Assessment & Plan:  1) Low-risk pregnancy G3P1011 at [redacted]w[redacted]d with an Estimated Date of Delivery: 04/30/24   2) H/O DVT, on Lovenox   3) Palpitations> wore heart monitor, waiting on results  4) Prev C/S> wants VBAC   Meds: No orders of the defined types were placed in this encounter.  Labs/procedures today: U/S  Plan:  Continue  routine obstetrical care  Next visit: prefers will be in person for u/s     Reviewed: Preterm labor symptoms and general obstetric precautions including but not limited to vaginal bleeding, contractions, leaking of fluid and fetal movement were reviewed in detail with the patient.  All questions were answered. Does have home bp cuff. Office bp cuff given: not applicable. Check bp weekly, let us  know if consistently >140 and/or >90.  Follow-up: Return in about 4 weeks (around 01/08/2024) for LROB, US :OB F/U heart, CNM, in person.  Future Appointments  Date Time Provider Department Center  01/15/2024  3:00 PM [facility] US  [facility] None  01/15/2024  3:50 PM Ferd Householder, CNM [facility]    Orders Placed This Encounter  Procedures   US  OB Follow Up   Ferd Householder CNM, [facility] 12/11/2023 4:01 PM

## 2023-12-11 NOTE — Patient Instructions (Signed)
Stephanie Johns, thank you for choosing our office today! We appreciate the opportunity to meet your healthcare needs. You may receive a short survey by mail, e-mail, or through [employer]. If you are happy with your care we would appreciate if you could take just a few minutes to complete the survey questions. We read all of your comments and take your feedback very seriously. Thank you again for choosing our office.  Center for [employer] Team at [facility] ([address] [place], [place] [address]) Entrance C, located off of [address] Free 24/7 valet parking  Go to [email or web address] to register for FREE online childbirth classes  Call the office [phone]) or go to [facility] if: You begin to severe cramping Your water breaks.  Sometimes it is a big gush of fluid, sometimes it is just a trickle that keeps getting your panties wet or running down your legs You have vaginal bleeding.  It is normal to have a small amount of spotting if your cervix was checked.   [facility] Pediatricians/Family Doctors [facility]): [address] Colette Ribas, [phone]           [facility]: [address], [phone]                [facility]): [address], [phone] (call to ask if accepting patients) [facility] Department: [address], [place], [phone]    [facility] Pediatricians/Family Doctors [facility]): [phone], [phone] [facility]: [address] [place], [phone] [facility]: [address], [phone]  [facility] Doctors  [facility]): [phone] [facility]: [address] [phone]   [facility] Doctors [facility]: [address] [phone]  [facility] Doctors  [facility]: [phone], [phone]  Home Blood Pressure Monitoring for Patients   Your provider has recommended that you check your blood pressure (BP) at least once a week at home. If you do not have a blood pressure cuff at home, one will be provided for you. Contact your provider if you have not received your monitor within 1 week.   Helpful Tips for Accurate Home Blood Pressure Checks  Don't smoke, exercise, or drink caffeine 30 minutes before checking your BP Use the restroom before checking your BP (a full bladder can raise your pressure) Relax in a comfortable upright chair Feet on the ground Left arm resting comfortably on a flat surface at the level of your heart Legs uncrossed Back supported Sit quietly and don't talk Place the cuff on your bare arm Adjust snuggly, so that only two fingertips can fit between your skin and the top of the cuff Check 2 readings separated by at least one minute Keep a log of your BP readings For a visual, please reference this diagram: http://ccnc.care/bpdiagram  Provider Name: [facility]     Phone: [phone]  Zone 1: ALL CLEAR  Continue to monitor your symptoms:  BP reading is less than 140 (top number) or less than 90 (bottom number)  No right upper stomach pain No headaches or seeing spots No feeling nauseated or throwing up No swelling in face and hands  Zone 2: CAUTION Call your doctor's office for any of the following:  BP reading is greater than 140 (top number) or greater than  90 (bottom number)  Stomach pain under your ribs in the middle or right side Headaches or seeing spots Feeling nauseated or throwing up Swelling in face and hands  Zone 3: EMERGENCY  Seek immediate medical care if you have any of the following:  BP reading is greater than160 (top number) or greater than 110 (bottom number) Severe headaches not improving with Tylenol Serious difficulty catching your breath Any worsening symptoms from  Zone 2     Second Trimester of Pregnancy The second trimester is from week 14 through week 27 (months 4 through 6). The second trimester is often a time when you feel your best. Your body has adjusted to being pregnant, and you begin to feel better physically. Usually, morning sickness has lessened or quit completely, you may have more energy, and you may have an increase in appetite. The second trimester is also a time when the fetus is growing rapidly. At the end of the sixth month, the fetus is about 9 inches long and weighs about 1 pounds. You will likely begin to feel the baby move (quickening) between 16 and 20 weeks of pregnancy. Body changes during your second trimester Your body continues to go through many changes during your second trimester. The changes vary from woman to woman. Your weight will continue to increase. You will notice your lower abdomen bulging out. You may begin to get stretch marks on your hips, abdomen, and breasts. You may develop headaches that can be relieved by medicines. The medicines should be approved by your health care provider. You may urinate more often because the fetus is pressing on your bladder. You may develop or continue to have heartburn as a result of your pregnancy. You may develop constipation because certain hormones are causing the muscles that push waste through your intestines to slow down. You may develop hemorrhoids or swollen, bulging veins (varicose veins). You may have back pain. This is caused by: Weight gain. Pregnancy hormones that are relaxing the joints in your pelvis. A shift in weight and the muscles that support your balance. Your breasts will continue to grow and they will continue to become tender. Your gums may bleed and may be sensitive to brushing and flossing. Dark spots or blotches (chloasma, mask of pregnancy) may develop on your face. This will likely fade after the baby is born. A dark line from your belly button to  the pubic area (linea nigra) may appear. This will likely fade after the baby is born. You may have changes in your hair. These can include thickening of your hair, rapid growth, and changes in texture. Some women also have hair loss during or after pregnancy, or hair that feels dry or thin. Your hair will most likely return to normal after your baby is born.  What to expect at prenatal visits During a routine prenatal visit: You will be weighed to make sure you and the fetus are growing normally. Your blood pressure will be taken. Your abdomen will be measured to track your baby's growth. The fetal heartbeat will be listened to. Any test results from the previous visit will be discussed.  Your health care provider may ask you: How you are feeling. If you are feeling the baby move. If you have had any abnormal symptoms, such as leaking fluid, bleeding, severe headaches, or abdominal cramping. If you are using any tobacco products, including cigarettes, chewing tobacco, and electronic cigarettes. If you have any questions.  Other tests that may be performed during  your second trimester include: Blood tests that check for: Low iron levels (anemia). High blood sugar that affects pregnant women (gestational diabetes) between 24 and 28 weeks. Rh antibodies. This is to check for a protein on red blood cells (Rh factor). Urine tests to check for infections, diabetes, or protein in the urine. An ultrasound to confirm the proper growth and development of the baby. An amniocentesis to check for possible genetic problems. Fetal screens for spina bifida and Down syndrome. HIV (human immunodeficiency virus) testing. Routine prenatal testing includes screening for HIV, unless you choose not to have this test.  Follow these instructions at home: Medicines Follow your health care provider's instructions regarding medicine use. Specific medicines may be either safe or unsafe to take during  pregnancy. Take a prenatal vitamin that contains at least 600 micrograms (mcg) of folic acid. If you develop constipation, try taking a stool softener if your health care provider approves. Eating and drinking Eat a balanced diet that includes fresh fruits and vegetables, whole grains, good sources of protein such as meat, eggs, or tofu, and low-fat dairy. Your health care provider will help you determine the amount of weight gain that is right for you. Avoid raw meat and uncooked cheese. These carry germs that can cause birth defects in the baby. If you have low calcium intake from food, talk to your health care provider about whether you should take a daily calcium supplement. Limit foods that are high in fat and processed sugars, such as fried and sweet foods. To prevent constipation: Drink enough fluid to keep your urine clear or pale yellow. Eat foods that are high in fiber, such as fresh fruits and vegetables, whole grains, and beans. Activity Exercise only as directed by your health care provider. Most women can continue their usual exercise routine during pregnancy. Try to exercise for 30 minutes at least 5 days a week. Stop exercising if you experience uterine contractions. Avoid heavy lifting, wear low heel shoes, and practice good posture. A sexual relationship may be continued unless your health care provider directs you otherwise. Relieving pain and discomfort Wear a good support bra to prevent discomfort from breast tenderness. Take warm sitz baths to soothe any pain or discomfort caused by hemorrhoids. Use hemorrhoid cream if your health care provider approves. Rest with your legs elevated if you have leg cramps or low back pain. If you develop varicose veins, wear support hose. Elevate your feet for 15 minutes, 3-4 times a day. Limit salt in your diet. Prenatal Care Write down your questions. Take them to your prenatal visits. Keep all your prenatal visits as told by your health  care provider. This is important. Safety Wear your seat belt at all times when driving. Make a list of emergency phone numbers, including numbers for family, friends, the hospital, and police and fire departments. General instructions Ask your health care provider for a referral to a local prenatal education class. Begin classes no later than the beginning of month 6 of your pregnancy. Ask for help if you have counseling or nutritional needs during pregnancy. Your health care provider can offer advice or refer you to specialists for help with various needs. Do not use hot tubs, steam rooms, or saunas. Do not douche or use tampons or scented sanitary pads. Do not cross your legs for long periods of time. Avoid cat litter boxes and soil used by cats. These carry germs that can cause birth defects in the baby and possibly loss of the  fetus by miscarriage or stillbirth. Avoid all smoking, herbs, alcohol, and unprescribed drugs. Chemicals in these products can affect the formation and growth of the baby. Do not use any products that contain nicotine or tobacco, such as cigarettes and e-cigarettes. If you need help quitting, ask your health care provider. Visit your dentist if you have not gone yet during your pregnancy. Use a soft toothbrush to brush your teeth and be gentle when you floss. Contact a health care provider if: You have dizziness. You have mild pelvic cramps, pelvic pressure, or nagging pain in the abdominal area. You have persistent nausea, vomiting, or diarrhea. You have a bad smelling vaginal discharge. You have pain when you urinate. Get help right away if: You have a fever. You are leaking fluid from your vagina. You have spotting or bleeding from your vagina. You have severe abdominal cramping or pain. You have rapid weight gain or weight loss. You have shortness of breath with chest pain. You notice sudden or extreme swelling of your face, hands, ankles, feet, or legs. You  have not felt your baby move in over an hour. You have severe headaches that do not go away when you take medicine. You have vision changes. Summary The second trimester is from week 14 through week 27 (months 4 through 6). It is also a time when the fetus is growing rapidly. Your body goes through many changes during pregnancy. The changes vary from woman to woman. Avoid all smoking, herbs, alcohol, and unprescribed drugs. These chemicals affect the formation and growth your baby. Do not use any tobacco products, such as cigarettes, chewing tobacco, and e-cigarettes. If you need help quitting, ask your health care provider. Contact your health care provider if you have any questions. Keep all prenatal visits as told by your health care provider. This is important. This information is not intended to replace advice given to you by your health care provider. Make sure you discuss any questions you have with your health care provider. Document Released: 07/24/2001 Document Revised: 01/05/2016 Document Reviewed: 09/30/2012 Elsevier Interactive Patient Education  2017 [employer].

## 2023-12-11 NOTE — Progress Notes (Signed)
 US : GA = 19+6 weeks Single active fetus, complete breech, FHR = 157 bpm, anterior pl. Gr1, MVP = 4.9 cm Cardiac screen incomplete, need 4 ch view/RVOT/LVOT/3VTV EFW 31% 302g  nl ov's, CL = 4.1 cm - closed

## 2023-12-13 ENCOUNTER — Encounter: Payer: Self-pay | Admitting: Family

## 2023-12-23 ENCOUNTER — Other Ambulatory Visit: Payer: Self-pay

## 2023-12-23 MED ORDER — VALACYCLOVIR HCL 500 MG PO TABS: 500.0000 mg | ORAL_TABLET | Freq: Every day | ORAL | 11 refills | Status: DC

## 2024-01-03 ENCOUNTER — Ambulatory Visit: Payer: Self-pay

## 2024-01-15 ENCOUNTER — Ambulatory Visit: Admitting: Women's Health

## 2024-01-15 ENCOUNTER — Ambulatory Visit

## 2024-01-15 ENCOUNTER — Encounter: Payer: Self-pay | Admitting: Women's Health

## 2024-01-15 VITALS — BP 111/68 | HR 74 | Wt 212.0 lb

## 2024-01-15 DIAGNOSIS — Z3A24 24 weeks gestation of pregnancy: Secondary | ICD-10-CM | POA: Diagnosis not present

## 2024-01-15 DIAGNOSIS — O23592 Infection of other part of genital tract in pregnancy, second trimester: Secondary | ICD-10-CM

## 2024-01-15 DIAGNOSIS — Z86718 Personal history of other venous thrombosis and embolism: Secondary | ICD-10-CM | POA: Diagnosis not present

## 2024-01-15 DIAGNOSIS — O34219 Maternal care for unspecified type scar from previous cesarean delivery: Secondary | ICD-10-CM

## 2024-01-15 DIAGNOSIS — Z362 Encounter for other antenatal screening follow-up: Secondary | ICD-10-CM | POA: Diagnosis not present

## 2024-01-15 DIAGNOSIS — R350 Frequency of micturition: Secondary | ICD-10-CM | POA: Diagnosis not present

## 2024-01-15 DIAGNOSIS — Z3482 Encounter for supervision of other normal pregnancy, second trimester: Secondary | ICD-10-CM

## 2024-01-15 DIAGNOSIS — Z3480 Encounter for supervision of other normal pregnancy, unspecified trimester: Secondary | ICD-10-CM

## 2024-01-15 LAB — POCT URINALYSIS DIPSTICK OB
Blood, UA: NEGATIVE
Glucose, UA: NEGATIVE
Ketones, UA: NEGATIVE
Leukocytes, UA: NEGATIVE
Nitrite, UA: NEGATIVE
POC,PROTEIN,UA: NEGATIVE

## 2024-01-15 NOTE — Progress Notes (Signed)
 US  24+6 wks,breech,CX 4.2 cm,anterior placenta gr 0,normal ovaries,SVP of fluid 6.4 cm,FHR 159 bpm,EFW 767 g 49%,anatomy of heart complete,limited view because of fetal position

## 2024-01-15 NOTE — Patient Instructions (Signed)
 Baxter Hire, thank you for choosing our office today! We appreciate the opportunity to meet your healthcare needs. You may receive a short survey by mail, e-mail, or through [employer]. If you are happy with your care we would appreciate if you could take just a few minutes to complete the survey questions. We read all of your comments and take your feedback very seriously. Thank you again for choosing our office.  Center for [employer] Team at [facility]  [facility] ([address] [place], [place] [address]) Entrance C, located off of [address] Free 24/7 valet parking   You will have your sugar test next visit.  Please do not eat or drink anything after midnight the night before you come, not even water.  You will be here for at least two hours.  Please make an appointment online for the bloodwork at [email or web address] for 8:00am (or as close to this as possible). Make sure you select the [facility] service center.   CLASSES: Go to [email or web address] to register for classes (childbirth, breastfeeding, waterbirth, infant CPR, daddy bootcamp, etc.)  Call the office [phone]) or go to [facility] if: You begin to have strong, frequent contractions Your water breaks.  Sometimes it is a big gush of fluid, sometimes it is just a trickle that keeps getting your panties wet or running down your legs You have vaginal bleeding.  It is normal to have a small amount of spotting if your cervix was checked.  You don't feel your baby moving like normal.  If you don't, get you something to eat and drink and lay down and focus on feeling your baby move.   If your baby is still not moving like normal, you should call the office or go to [facility].  Call the office [phone]) or go to [facility] hospital for these signs of pre-eclampsia: Severe headache that does not go away with Tylenol Visual changes- seeing spots, double, blurred vision Pain under your right breast or upper  abdomen that does not go away with Tums or heartburn medicine Nausea and/or vomiting Severe swelling in your hands, feet, and face    [place] Pediatricians/Family Doctors [facility]): [address] Colette Ribas, [phone]           [facility]: [address], [phone]                [facility]): [address], [phone]  [facility] Department: [address], [place], [phone]    [facility] Pediatricians/Family Doctors [facility]): [address], [phone] [facility]: [address] [place], [phone] [facility]: [address], [phone]  [facility] Doctors  [facility] [place] [facility]): [phone] [facility]: [address], [phone]   [facility] Doctors [facility]: [address] [phone]  [facility] Doctors  [facility]: [phone], [phone]  Home Blood Pressure Monitoring for Patients   Your provider has recommended that you check your blood pressure (BP) at least once a week at home. If you do not have a blood pressure cuff at home, one will be provided for you. Contact your provider if you have not received your monitor within 1 week.   Helpful Tips for Accurate Home Blood Pressure Checks  Don't smoke, exercise, or drink caffeine 30 minutes before checking  your BP Use the restroom before checking your BP (a full bladder can raise your pressure) Relax in a comfortable upright chair Feet on the ground Left arm resting comfortably on a flat surface at the level of your heart Legs uncrossed Back supported Sit quietly and don't talk Place the cuff on your bare arm Adjust snuggly, so that only two fingertips can fit between your skin and the top of the cuff Check 2 readings separated by at least one  minute Keep a log of your BP readings For a visual, please reference this diagram: http://ccnc.care/bpdiagram  Provider Name: [facility]     Phone: [phone]  Zone 1: ALL CLEAR  Continue to monitor your symptoms:  BP reading is less than 140 (top number) or less than 90 (bottom number)  No right upper stomach pain No headaches or seeing spots No feeling nauseated or throwing up No swelling in face and hands  Zone 2: CAUTION Call your doctor's office for any of the following:  BP reading is greater than 140 (top number) or greater than 90 (bottom number)  Stomach pain under your ribs in the middle or right side Headaches or seeing spots Feeling nauseated or throwing up Swelling in face and hands  Zone 3: EMERGENCY  Seek immediate medical care if you have any of the following:  BP reading is greater than160 (top number) or greater than 110 (bottom number) Severe headaches not improving with Tylenol Serious difficulty catching your breath Any worsening symptoms from Zone 2   Second Trimester of Pregnancy The second trimester is from week 13 through week 28, months 4 through 6. The second trimester is often a time when you feel your best. Your body has also adjusted to being pregnant, and you begin to feel better physically. Usually, morning sickness has lessened or quit completely, you may have more energy, and you may have an increase in appetite. The second trimester is also a time when the fetus is growing rapidly. At the end of the sixth month, the fetus is about 9 inches long and weighs about 1 pounds. You will likely begin to feel the baby move (quickening) between 18 and 20 weeks of the pregnancy. BODY CHANGES Your body goes through many changes during pregnancy. The changes vary from woman to woman.  Your weight will continue to increase. You will notice your lower abdomen bulging out. You may begin to get stretch marks on your hips, abdomen, and breasts. You may  develop headaches that can be relieved by medicines approved by your health care provider. You may urinate more often because the fetus is pressing on your bladder. You may develop or continue to have heartburn as a result of your pregnancy. You may develop constipation because certain hormones are causing the muscles that push waste through your intestines to slow down. You may develop hemorrhoids or swollen, bulging veins (varicose veins). You may have back pain because of the weight gain and pregnancy hormones relaxing your joints between the bones in your pelvis and as a result of a shift in weight and the muscles that support your balance. Your breasts will continue to grow and be tender. Your gums may bleed and may be sensitive to brushing and flossing. Dark spots or blotches (chloasma, mask of pregnancy) may develop on your face. This will likely fade after the baby is born. A dark line from your belly button to the pubic area (linea nigra) may appear. This will likely fade after the  baby is born. You may have changes in your hair. These can include thickening of your hair, rapid growth, and changes in texture. Some women also have hair loss during or after pregnancy, or hair that feels dry or thin. Your hair will most likely return to normal after your baby is born. WHAT TO EXPECT AT YOUR PRENATAL VISITS During a routine prenatal visit: You will be weighed to make sure you and the fetus are growing normally. Your blood pressure will be taken. Your abdomen will be measured to track your baby's growth. The fetal heartbeat will be listened to. Any test results from the previous visit will be discussed. Your health care provider may ask you: How you are feeling. If you are feeling the baby move. If you have had any abnormal symptoms, such as leaking fluid, bleeding, severe headaches, or abdominal cramping. If you have any questions. Other tests that may be performed during your second  trimester include: Blood tests that check for: Low iron levels (anemia). Gestational diabetes (between 24 and 28 weeks). Rh antibodies. Urine tests to check for infections, diabetes, or protein in the urine. An ultrasound to confirm the proper growth and development of the baby. An amniocentesis to check for possible genetic problems. Fetal screens for spina bifida and Down syndrome. HOME CARE INSTRUCTIONS  Avoid all smoking, herbs, alcohol, and unprescribed drugs. These chemicals affect the formation and growth of the baby. Follow your health care provider's instructions regarding medicine use. There are medicines that are either safe or unsafe to take during pregnancy. Exercise only as directed by your health care provider. Experiencing uterine cramps is a good sign to stop exercising. Continue to eat regular, healthy meals. Wear a good support bra for breast tenderness. Do not use hot tubs, steam rooms, or saunas. Wear your seat belt at all times when driving. Avoid raw meat, uncooked cheese, cat litter boxes, and soil used by cats. These carry germs that can cause birth defects in the baby. Take your prenatal vitamins. Try taking a stool softener (if your health care provider approves) if you develop constipation. Eat more high-fiber foods, such as fresh vegetables or fruit and whole grains. Drink plenty of fluids to keep your urine clear or pale yellow. Take warm sitz baths to soothe any pain or discomfort caused by hemorrhoids. Use hemorrhoid cream if your health care provider approves. If you develop varicose veins, wear support hose. Elevate your feet for 15 minutes, 3-4 times a day. Limit salt in your diet. Avoid heavy lifting, wear low heel shoes, and practice good posture. Rest with your legs elevated if you have leg cramps or low back pain. Visit your dentist if you have not gone yet during your pregnancy. Use a soft toothbrush to brush your teeth and be gentle when you floss. A  sexual relationship may be continued unless your health care provider directs you otherwise. Continue to go to all your prenatal visits as directed by your health care provider. SEEK MEDICAL CARE IF:  You have dizziness. You have mild pelvic cramps, pelvic pressure, or nagging pain in the abdominal area. You have persistent nausea, vomiting, or diarrhea. You have a bad smelling vaginal discharge. You have pain with urination. SEEK IMMEDIATE MEDICAL CARE IF:  You have a fever. You are leaking fluid from your vagina. You have spotting or bleeding from your vagina. You have severe abdominal cramping or pain. You have rapid weight gain or loss. You have shortness of breath with chest pain. You  notice sudden or extreme swelling of your face, hands, ankles, feet, or legs. You have not felt your baby move in over an hour. You have severe headaches that do not go away with medicine. You have vision changes. Document Released: 07/24/2001 Document Revised: 08/04/2013 Document Reviewed: 09/30/2012 [facility] Patient Information 2015 [place], [place]. This information is not intended to replace advice given to you by your health care provider. Make sure you discuss any questions you have with your health care provider.

## 2024-01-15 NOTE — Progress Notes (Signed)
 LOW-RISK PREGNANCY VISIT Patient name: Stephanie Johns MRN [id number]  Date of birth: 06-24-1996 Chief Complaint:   Routine Prenatal Visit and Pregnancy Ultrasound  History of Present Illness:   Stephanie Johns is a 28 y.o. G43P1011 female at [redacted]w[redacted]d with an Estimated Date of Delivery: 04/30/24 being seen today for ongoing management of a low-risk pregnancy.   Today she reports some increased urinary frequency and tenderness lower abd, not sure if has UTI or not. Wore heart monitor for tachycardia, states was sinus tachy, nothing needed right now.  Contractions: Not present.  .  Movement: Present. denies leaking of fluid.     10/28/2023    3:52 PM 02/28/2023    3:31 PM 09/28/2021    9:35 AM 10/23/2019   11:07 AM 02/19/2018    3:02 PM  Depression screen PHQ 2/9  Decreased Interest 0 0 0 0 0  Down, Depressed, Hopeless 0 1 2 0 0  PHQ - 2 Score 0 1 2 0 0  Altered sleeping 2 2 2  1   Tired, decreased energy 0 1 0  0  Change in appetite 2 2 2   0  Feeling bad or failure about yourself  1 1 1   0  Trouble concentrating 2 3 1   0  Moving slowly or fidgety/restless 0 0 0  0  Suicidal thoughts 0 0 0  0  PHQ-9 Score 7 10 8  1         10/28/2023    3:52 PM 02/28/2023    3:31 PM 09/28/2021    9:35 AM  GAD 7 : Generalized Anxiety Score  Nervous, Anxious, on Edge 3 2 2   Control/stop worrying 3 2 2   Worry too much - different things 3 2 2   Trouble relaxing 3 2 2   Restless 3 2 2   Easily annoyed or irritable 3 3 2   Afraid - awful might happen 2 2 1   Total GAD 7 Score 20 15 13       Review of Systems:   Pertinent items are noted in HPI Denies abnormal vaginal discharge w/ itching/odor/irritation, headaches, visual changes, shortness of breath, chest pain, abdominal pain, severe nausea/vomiting, or problems with urination or bowel movements unless otherwise stated above. Pertinent History Reviewed:  Reviewed past medical,surgical, social, obstetrical and family history.  Reviewed problem list,  medications and allergies. Physical Assessment:   Vitals:   01/15/24 1548  BP: 111/68  Pulse: 74  Weight: 212 lb (96.2 kg)  Body mass index is 34.22 kg/m.        Physical Examination:   General appearance: Well appearing, and in no distress  Mental status: Alert, oriented to person, place, and time  Skin: Warm & dry  Cardiovascular: Normal heart rate noted  Respiratory: Normal respiratory effort, no distress  Abdomen: Soft, gravid, nontender  Pelvic: Cervical exam deferred         Extremities: Edema: None  Fetal Status:     Movement: Present  US  24+6 wks,breech,CX 4.2 cm,anterior placenta gr 0,normal ovaries,SVP of fluid 6.4 cm,FHR 159 bpm,EFW 767 g 49%,anatomy of heart complete,limited view because of fetal position   Chaperone: N/A Results for orders placed or performed in visit on 01/15/24 (from the past 24 hours)  POC Urinalysis Dipstick OB   Collection Time: 01/15/24  4:11 PM  Result Value Ref Range   Color, UA     Clarity, UA     Glucose, UA Negative Negative   Bilirubin, UA     Ketones, UA  negative    Spec Grav, UA     Blood, UA negative    pH, UA     POC,PROTEIN,UA Negative Negative, Trace, Small (1+), Moderate (2+), Large (3+), 4+   Urobilinogen, UA     Nitrite, UA negative    Leukocytes, UA Negative Negative   Appearance     Odor      Assessment & Plan:  1) Low-risk pregnancy G3P1011 at [redacted]w[redacted]d with an Estimated Date of Delivery: 04/30/24   2) H/O DVT, on Lovenox   3) H/O C/S> wants TOLAC  4) Slight increased urinary frequency> send cx   Meds: No orders of the defined types were placed in this encounter.  Labs/procedures today: U/S and urine culture  Plan:  Continue routine obstetrical care  Next visit: prefers will be in person for pn2    Reviewed: Preterm labor symptoms and general obstetric precautions including but not limited to vaginal bleeding, contractions, leaking of fluid and fetal movement were reviewed in detail with the patient.  All  questions were answered. Does have home bp cuff. Office bp cuff given: not applicable. Check bp weekly, let us  know if consistently >140 and/or >90.  Follow-up: Return in about 3 weeks (around 02/05/2024) for LROB, PN2, CNM, in person.  Future Appointments  Date Time Provider Department Center  02/07/2024  8:30 AM [facility]  02/07/2024  9:10 AM Jolayne Natter, CNM [facility]    Orders Placed This Encounter  Procedures   Urine Culture   POC Urinalysis Dipstick OB   Ferd Householder CNM,  [facility] 01/15/2024 4:14 PM

## 2024-01-17 LAB — URINE CULTURE

## 2024-01-20 ENCOUNTER — Ambulatory Visit: Payer: Self-pay | Admitting: Women's Health

## 2024-01-20 ENCOUNTER — Encounter: Payer: Self-pay | Admitting: *Deleted

## 2024-02-07 ENCOUNTER — Ambulatory Visit: Admitting: Advanced Practice Midwife

## 2024-02-07 ENCOUNTER — Other Ambulatory Visit

## 2024-02-07 ENCOUNTER — Encounter: Payer: Self-pay | Admitting: Advanced Practice Midwife

## 2024-02-07 VITALS — BP 109/72 | HR 98 | Wt 211.0 lb

## 2024-02-07 DIAGNOSIS — Z98891 History of uterine scar from previous surgery: Secondary | ICD-10-CM

## 2024-02-07 DIAGNOSIS — Z23 Encounter for immunization: Secondary | ICD-10-CM | POA: Diagnosis not present

## 2024-02-07 DIAGNOSIS — Z131 Encounter for screening for diabetes mellitus: Secondary | ICD-10-CM

## 2024-02-07 DIAGNOSIS — Z3A28 28 weeks gestation of pregnancy: Secondary | ICD-10-CM

## 2024-02-07 DIAGNOSIS — Z3480 Encounter for supervision of other normal pregnancy, unspecified trimester: Secondary | ICD-10-CM

## 2024-02-07 DIAGNOSIS — Z3483 Encounter for supervision of other normal pregnancy, third trimester: Secondary | ICD-10-CM | POA: Diagnosis not present

## 2024-02-07 NOTE — Progress Notes (Signed)
   LOW-RISK PREGNANCY VISIT Patient name: Stephanie Johns MRN [id number]  Date of birth: 1996-05-22 Chief Complaint:   Routine Prenatal Visit (PN2. Tdap information given)  History of Present Illness:   Stephanie Johns is a 28 y.o. G62P1011 female at [redacted]w[redacted]d with an Estimated Date of Delivery: 04/30/24 being seen today for ongoing management of a low-risk pregnancy.  Today she reports no complaints. Contractions: Not present.  .  Movement: Present. denies leaking of fluid. Review of Systems:   Pertinent items are noted in HPI Denies abnormal vaginal discharge w/ itching/odor/irritation, headaches, visual changes, shortness of breath, chest pain, abdominal pain, severe nausea/vomiting, or problems with urination or bowel movements unless otherwise stated above. Pertinent History Reviewed:  Reviewed past medical,surgical, social, obstetrical and family history.  Reviewed problem list, medications and allergies. Physical Assessment:   Vitals:   02/07/24 0908  BP: 109/72  Pulse: 98  Weight: 211 lb (95.7 kg)  Body mass index is 34.06 kg/m.        Physical Examination:   General appearance: Well appearing, and in no distress  Mental status: Alert, oriented to person, place, and time  Skin: Warm & dry  Cardiovascular: Normal heart rate noted  Respiratory: Normal respiratory effort, no distress  Abdomen: Soft, gravid, nontender  Pelvic: Cervical exam deferred         Extremities: Edema: None  Fetal Status: Fetal Heart Rate (bpm): 147 Fundal Height: 28 cm Movement: Present    No results found for this or any previous visit (from the past 24 hours).  Assessment & Plan:  1) Low-risk pregnancy G3P1011 at [redacted]w[redacted]d with an Estimated Date of Delivery: 04/30/24   2) Hx DVT, on Lovenox   3) Hx C/S, wants TOLAC   Meds: No orders of the defined types were placed in this encounter.  Labs/procedures today: PN2; Tdap given  Plan:  Continue routine obstetrical care   Reviewed: Preterm labor  symptoms and general obstetric precautions including but not limited to vaginal bleeding, contractions, leaking of fluid and fetal movement were reviewed in detail with the patient.  All questions were answered. Has home bp cuff. Check bp weekly, let us  know if >140/90.   Follow-up: Return in about 2 weeks (around 02/21/2024) for LROB, in person.  No orders of the defined types were placed in this encounter.  Suzen JONETTA Gentry CNM 02/07/2024 9:33 AM

## 2024-02-07 NOTE — Patient Instructions (Signed)
Baxter Hire, thank you for choosing our office today! We appreciate the opportunity to meet your healthcare needs. You may receive a short survey by mail, e-mail, or through [employer]. If you are happy with your care we would appreciate if you could take just a few minutes to complete the survey questions. We read all of your comments and take your feedback very seriously. Thank you again for choosing our office.  Center for [employer] Team at [facility]  [facility] at [facility] ([address] [place], [place] [address]) Entrance C, located off of [address] Free 24/7 valet parking   CLASSES: Go to [email or web address] to register for classes (childbirth, breastfeeding, waterbirth, infant CPR, daddy bootcamp, etc.)  Call the office [phone]) or go to [facility] if: You begin to have strong, frequent contractions Your water breaks.  Sometimes it is a big gush of fluid, sometimes it is just a trickle that keeps getting your panties wet or running down your legs You have vaginal bleeding.  It is normal to have a small amount of spotting if your cervix was checked.  You don't feel your baby moving like normal.  If you don't, get you something to eat and drink and lay down and focus on feeling your baby move.   If your baby is still not moving like normal, you should call the office or go to [facility].  Call the office [phone]) or go to [facility] hospital for these signs of pre-eclampsia: Severe headache that does not go away with Tylenol Visual changes- seeing spots, double, blurred vision Pain under your right breast or upper abdomen that does not go away with Tums or heartburn medicine Nausea and/or vomiting Severe swelling in your hands, feet, and face   Tdap Vaccine It is recommended that you get the Tdap vaccine during the third trimester of EACH pregnancy to help protect your baby from getting pertussis (whooping cough) 27-36 weeks is the BEST time to do  this so that you can pass the protection on to your baby. During pregnancy is better than after pregnancy, but if you are unable to get it during pregnancy it will be offered at the hospital.  You can get this vaccine with [place], at the health department, your family doctor, or some local pharmacies Everyone who will be around your baby should also be up-to-date on their vaccines before the baby comes. Adults (who are not pregnant) only need 1 dose of Tdap during adulthood.   [facility] Pediatricians/Family Doctors [facility]): [address] Colette Ribas, [phone]           [facility]: [address], [phone]                [facility]): [address], [phone] (call to ask if accepting patients) [facility] Department: [address], [place], [phone]    [facility] Pediatricians/Family Doctors [facility] [phone], [phone] [facility]: [address] [place], [phone] [facility]: [address], [phone]  [facility] Doctors  [facility]): [phone] [facility]: [address], [phone]   [facility]: [address] [phone]  [facility] Family Doctors  [facility]: [phone], [phone]  Home Blood Pressure Monitoring for Patients   Your provider has recommended that you check your  blood pressure (BP) at least once a week at home. If you do not have a blood pressure cuff at home, one will be provided for you. Contact your provider if you have not received your monitor within 1 week.   Helpful Tips for Accurate Home Blood Pressure Checks  Don't smoke, exercise, or drink caffeine 30 minutes before checking your BP Use the restroom before checking your BP (a full bladder can raise your  pressure) Relax in a comfortable upright chair Feet on the ground Left arm resting comfortably on a flat surface at the level of your heart Legs uncrossed Back supported Sit quietly and don't talk Place the cuff on your bare arm Adjust snuggly, so that only two fingertips can fit between your skin and the top of the cuff Check 2 readings separated by at least one minute Keep a log of your BP readings For a visual, please reference this diagram: http://ccnc.care/bpdiagram  Provider Name: [facility]     Phone: [phone]  Zone 1: ALL CLEAR  Continue to monitor your symptoms:  BP reading is less than 140 (top number) or less than 90 (bottom number)  No right upper stomach pain No headaches or seeing spots No feeling nauseated or throwing up No swelling in face and hands  Zone 2: CAUTION Call your doctor's office for any of the following:  BP reading is greater than 140 (top number) or greater than 90 (bottom number)  Stomach pain under your ribs in the middle or right side Headaches or seeing spots Feeling nauseated or throwing up Swelling in face and hands  Zone 3: EMERGENCY  Seek immediate medical care if you have any of the following:  BP reading is greater than160 (top number) or greater than 110 (bottom number) Severe headaches not improving with Tylenol Serious difficulty catching your breath Any worsening symptoms from Zone 2   Third Trimester of Pregnancy The third trimester is from week 29 through week 42, months 7 through 9. The third trimester is a time when the fetus is growing rapidly. At the end of the ninth month, the fetus is about 20 inches in length and weighs 6-10 pounds.  BODY CHANGES Your body goes through many changes during pregnancy. The changes vary from woman to woman.  Your weight will continue to increase. You can expect to gain 25-35 pounds (11-16 kg) by the end of the pregnancy. You may begin to get stretch [place] on your hips, abdomen,  and breasts. You may urinate more often because the fetus is moving lower into your pelvis and pressing on your bladder. You may develop or continue to have heartburn as a result of your pregnancy. You may develop constipation because certain hormones are causing the muscles that push waste through your intestines to slow down. You may develop hemorrhoids or swollen, bulging veins (varicose veins). You may have pelvic pain because of the weight gain and pregnancy hormones relaxing your joints between the bones in your pelvis. Backaches may result from overexertion of the muscles supporting your posture. You may have changes in your hair. These can include thickening of your hair, rapid growth, and changes in texture. Some women also have hair loss during or after pregnancy, or hair that feels dry or thin. Your hair will most likely return to normal after your baby is born. Your breasts will continue to grow and be tender. A yellow discharge may leak from your breasts called colostrum. Your belly button may stick out. You may   feel short of breath because of your expanding uterus. You may notice the fetus "dropping," or moving lower in your abdomen. You may have a bloody mucus discharge. This usually occurs a few days to a week before labor begins. Your cervix becomes thin and soft (effaced) near your due date. WHAT TO EXPECT AT YOUR PRENATAL EXAMS  You will have prenatal exams every 2 weeks until week 36. Then, you will have weekly prenatal exams. During a routine prenatal visit: You will be weighed to make sure you and the fetus are growing normally. Your blood pressure is taken. Your abdomen will be measured to track your baby's growth. The fetal heartbeat will be listened to. Any test results from the previous visit will be discussed. You may have a cervical check near your due date to see if you have effaced. At around 36 weeks, your caregiver will check your cervix. At the same time, your  caregiver will also perform a test on the secretions of the vaginal tissue. This test is to determine if a type of bacteria, Group B streptococcus, is present. Your caregiver will explain this further. Your caregiver may ask you: What your birth plan is. How you are feeling. If you are feeling the baby move. If you have had any abnormal symptoms, such as leaking fluid, bleeding, severe headaches, or abdominal cramping. If you have any questions. Other tests or screenings that may be performed during your third trimester include: Blood tests that check for low iron levels (anemia). Fetal testing to check the health, activity level, and growth of the fetus. Testing is done if you have certain medical conditions or if there are problems during the pregnancy. FALSE LABOR You may feel small, irregular contractions that eventually go away. These are called Braxton Hicks contractions, or false labor. Contractions may last for hours, days, or even weeks before true labor sets in. If contractions come at regular intervals, intensify, or become painful, it is best to be seen by your caregiver.  SIGNS OF LABOR  Menstrual-like cramps. Contractions that are 5 minutes apart or less. Contractions that start on the top of the uterus and spread down to the lower abdomen and back. A sense of increased pelvic pressure or back pain. A watery or bloody mucus discharge that comes from the vagina. If you have any of these signs before the 37th week of pregnancy, call your caregiver right away. You need to go to the hospital to get checked immediately. HOME CARE INSTRUCTIONS  Avoid all smoking, herbs, alcohol, and unprescribed drugs. These chemicals affect the formation and growth of the baby. Follow your caregiver's instructions regarding medicine use. There are medicines that are either safe or unsafe to take during pregnancy. Exercise only as directed by your caregiver. Experiencing uterine cramps is a good sign to  stop exercising. Continue to eat regular, healthy meals. Wear a good support bra for breast tenderness. Do not use hot tubs, steam rooms, or saunas. Wear your seat belt at all times when driving. Avoid raw meat, uncooked cheese, cat litter boxes, and soil used by cats. These carry germs that can cause birth defects in the baby. Take your prenatal vitamins. Try taking a stool softener (if your caregiver approves) if you develop constipation. Eat more high-fiber foods, such as fresh vegetables or fruit and whole grains. Drink plenty of fluids to keep your urine clear or pale yellow. Take warm sitz baths to soothe any pain or discomfort caused by hemorrhoids. Use hemorrhoid cream if   your caregiver approves. If you develop varicose veins, wear support hose. Elevate your feet for 15 minutes, 3-4 times a day. Limit salt in your diet. Avoid heavy lifting, wear low heal shoes, and practice good posture. Rest a lot with your legs elevated if you have leg cramps or low back pain. Visit your dentist if you have not gone during your pregnancy. Use a soft toothbrush to brush your teeth and be gentle when you floss. A sexual relationship may be continued unless your caregiver directs you otherwise. Do not travel far distances unless it is absolutely necessary and only with the approval of your caregiver. Take prenatal classes to understand, practice, and ask questions about the labor and delivery. Make a trial run to the hospital. Pack your hospital bag. Prepare the baby's nursery. Continue to go to all your prenatal visits as directed by your caregiver. SEEK MEDICAL CARE IF: You are unsure if you are in labor or if your water has broken. You have dizziness. You have mild pelvic cramps, pelvic pressure, or nagging pain in your abdominal area. You have persistent nausea, vomiting, or diarrhea. You have a bad smelling vaginal discharge. You have pain with urination. SEEK IMMEDIATE MEDICAL CARE IF:  You  have a fever. You are leaking fluid from your vagina. You have spotting or bleeding from your vagina. You have severe abdominal cramping or pain. You have rapid weight loss or gain. You have shortness of breath with chest pain. You notice sudden or extreme swelling of your face, hands, ankles, feet, or legs. You have not felt your baby move in over an hour. You have severe headaches that do not go away with medicine. You have vision changes. Document Released: 07/24/2001 Document Revised: 08/04/2013 Document Reviewed: 09/30/2012 [facility] Patient Information 2015 [place], [place]. This information is not intended to replace advice given to you by your health care provider. Make sure you discuss any questions you have with your health care provider.

## 2024-02-08 LAB — CBC
Hematocrit: 36.2 % (ref 34.0–46.6)
Hemoglobin: 11.9 g/dL (ref 11.1–15.9)
MCH: 31.2 pg (ref 26.6–33.0)
MCHC: 32.9 g/dL (ref 31.5–35.7)
MCV: 95 fL (ref 79–97)
Platelets: 248 10*3/uL (ref 150–450)
RBC: 3.81 x10E6/uL (ref 3.77–5.28)
RDW: 13.1 % (ref 11.7–15.4)
WBC: 12.8 10*3/uL — ABNORMAL HIGH (ref 3.4–10.8)

## 2024-02-08 LAB — ANTIBODY SCREEN: Antibody Screen: NEGATIVE

## 2024-02-08 LAB — RPR: RPR Ser Ql: NONREACTIVE

## 2024-02-08 LAB — GLUCOSE TOLERANCE, 2 HOURS W/ 1HR
Glucose, 1 hour: 208 mg/dL — ABNORMAL HIGH (ref 70–179)
Glucose, 2 hour: 184 mg/dL — ABNORMAL HIGH (ref 70–152)
Glucose, Fasting: 103 mg/dL — ABNORMAL HIGH (ref 70–91)

## 2024-02-08 LAB — HIV ANTIBODY (ROUTINE TESTING W REFLEX): HIV Screen 4th Generation wRfx: NONREACTIVE

## 2024-02-12 ENCOUNTER — Ambulatory Visit: Payer: Self-pay | Admitting: Advanced Practice Midwife

## 2024-02-12 ENCOUNTER — Encounter: Payer: Self-pay | Admitting: Advanced Practice Midwife

## 2024-02-12 DIAGNOSIS — Z8632 Personal history of gestational diabetes: Secondary | ICD-10-CM | POA: Insufficient documentation

## 2024-02-12 DIAGNOSIS — O24419 Gestational diabetes mellitus in pregnancy, unspecified control: Secondary | ICD-10-CM | POA: Insufficient documentation

## 2024-02-13 ENCOUNTER — Other Ambulatory Visit: Payer: Self-pay | Admitting: Advanced Practice Midwife

## 2024-02-13 DIAGNOSIS — O24410 Gestational diabetes mellitus in pregnancy, diet controlled: Secondary | ICD-10-CM

## 2024-02-13 MED ORDER — ACCU-CHEK GUIDE ME W/DEVICE KIT: 1.0000 | PACK | Freq: Four times a day (QID) | 0 refills | Status: DC

## 2024-02-13 MED ORDER — ACCU-CHEK SOFTCLIX LANCETS MISC: 12 refills | Status: DC

## 2024-02-13 MED ORDER — GLUCOSE BLOOD VI STRP: ORAL_STRIP | 12 refills | Status: DC

## 2024-02-17 ENCOUNTER — Encounter: Payer: Self-pay | Admitting: Women's Health

## 2024-02-26 ENCOUNTER — Encounter: Attending: Women's Health | Admitting: Nutrition

## 2024-02-26 ENCOUNTER — Encounter: Payer: Self-pay | Admitting: Nutrition

## 2024-02-26 ENCOUNTER — Ambulatory Visit: Admitting: Advanced Practice Midwife

## 2024-02-26 VITALS — Ht 67.0 in | Wt 210.0 lb

## 2024-02-26 VITALS — BP 117/75 | HR 81 | Wt 212.0 lb

## 2024-02-26 DIAGNOSIS — Z3A30 30 weeks gestation of pregnancy: Secondary | ICD-10-CM | POA: Diagnosis not present

## 2024-02-26 DIAGNOSIS — O24410 Gestational diabetes mellitus in pregnancy, diet controlled: Secondary | ICD-10-CM | POA: Diagnosis not present

## 2024-02-26 NOTE — Patient Instructions (Signed)
 Goals  Test blood sugars before breakfast and 2 hours after breakfast, lunch and dinner Goals glucose before breakfast 60-95 2 hours after meals < 120 mg/dl.  Drink 100 oz of water per day= 5-20 oz bottles  Focus on more vegetables, fresh fruits and whole grains. Avoid processed foods No simple sugars, desserts, sodas, tea or junk food  Walk 30 minutes or more each day.

## 2024-02-26 NOTE — Progress Notes (Signed)
  Patient was seen on 02-26-24 for Gestational Diabetes self-management  at [facility] In [place]. The following learning objectives were met by the patient during this course:  States the definition of Gestational Diabetes States why dietary management is important in controlling blood glucose Describes the effects each nutrient has on blood glucose levels Demonstrates ability to create a balanced meal plan Demonstrates carbohydrate counting  States when to check blood glucose levels Demonstrates proper blood glucose monitoring techniques States the effect of stress and exercise on blood glucose levels States the importance of limiting caffeine and abstaining from alcohol and smoking  FBS 95 mg/ld  Range 90-95 mg/dl. 2 hr pp  100-120 mg/dl lunch 2 hr pp 899-879'd mg/dl after dinner-   Patient instructed to monitor glucose levels: FBS: 60 - <90 1 hour: <140 2 hour: <120  *Patient received handouts: Nutrition Diabetes and Pregnancy Carbohydrate Counting List  Patient will be seen for follow-up 1 weeks.

## 2024-02-26 NOTE — Patient Instructions (Signed)
Stephanie Johns, thank you for choosing our office today! We appreciate the opportunity to meet your healthcare needs. You may receive a short survey by mail, e-mail, or through [employer]. If you are happy with your care we would appreciate if you could take just a few minutes to complete the survey questions. We read all of your comments and take your feedback very seriously. Thank you again for choosing our office.  Center for [employer] Team at [facility]  [facility] at [facility] ([address] [place], [place] [address]) Entrance C, located off of [address] Free 24/7 valet parking   CLASSES: Go to [email or web address] to register for classes (childbirth, breastfeeding, waterbirth, infant CPR, daddy bootcamp, etc.)  Call the office [phone]) or go to [facility] if: You begin to have strong, frequent contractions Your water breaks.  Sometimes it is a big gush of fluid, sometimes it is just a trickle that keeps getting your panties wet or running down your legs You have vaginal bleeding.  It is normal to have a small amount of spotting if your cervix was checked.  You don't feel your baby moving like normal.  If you don't, get you something to eat and drink and lay down and focus on feeling your baby move.   If your baby is still not moving like normal, you should call the office or go to [facility].  Call the office [phone]) or go to [facility] hospital for these signs of pre-eclampsia: Severe headache that does not go away with Tylenol Visual changes- seeing spots, double, blurred vision Pain under your right breast or upper abdomen that does not go away with Tums or heartburn medicine Nausea and/or vomiting Severe swelling in your hands, feet, and face   Tdap Vaccine It is recommended that you get the Tdap vaccine during the third trimester of EACH pregnancy to help protect your baby from getting pertussis (whooping cough) 27-36 weeks is the BEST time to do  this so that you can pass the protection on to your baby. During pregnancy is better than after pregnancy, but if you are unable to get it during pregnancy it will be offered at the hospital.  You can get this vaccine with [place], at the health department, your family doctor, or some local pharmacies Everyone who will be around your baby should also be up-to-date on their vaccines before the baby comes. Adults (who are not pregnant) only need 1 dose of Tdap during adulthood.   [facility] Pediatricians/Family Doctors [facility]): [address] Colette Ribas, [phone]           [facility]: [address], [phone]                [facility]): [address], [phone] (call to ask if accepting patients) [facility] Department: [address], [place], [phone]    [facility] Pediatricians/Family Doctors [facility] [phone], [phone] [facility]: [address] [place], [phone] [facility]: [address], [phone]  [facility] Doctors  [facility]): [phone] [facility]: [address], [phone]   [facility]: [address] [phone]  [facility] Family Doctors  [facility]: [phone], [phone]  Home Blood Pressure Monitoring for Patients   Your provider has recommended that you check your  blood pressure (BP) at least once a week at home. If you do not have a blood pressure cuff at home, one will be provided for you. Contact your provider if you have not received your monitor within 1 week.   Helpful Tips for Accurate Home Blood Pressure Checks  Don't smoke, exercise, or drink caffeine 30 minutes before checking your BP Use the restroom before checking your BP (a full bladder can raise your  pressure) Relax in a comfortable upright chair Feet on the ground Left arm resting comfortably on a flat surface at the level of your heart Legs uncrossed Back supported Sit quietly and don't talk Place the cuff on your bare arm Adjust snuggly, so that only two fingertips can fit between your skin and the top of the cuff Check 2 readings separated by at least one minute Keep a log of your BP readings For a visual, please reference this diagram: http://ccnc.care/bpdiagram  Provider Name: [facility]     Phone: [phone]  Zone 1: ALL CLEAR  Continue to monitor your symptoms:  BP reading is less than 140 (top number) or less than 90 (bottom number)  No right upper stomach pain No headaches or seeing spots No feeling nauseated or throwing up No swelling in face and hands  Zone 2: CAUTION Call your doctor's office for any of the following:  BP reading is greater than 140 (top number) or greater than 90 (bottom number)  Stomach pain under your ribs in the middle or right side Headaches or seeing spots Feeling nauseated or throwing up Swelling in face and hands  Zone 3: EMERGENCY  Seek immediate medical care if you have any of the following:  BP reading is greater than160 (top number) or greater than 110 (bottom number) Severe headaches not improving with Tylenol Serious difficulty catching your breath Any worsening symptoms from Zone 2   Third Trimester of Pregnancy The third trimester is from week 29 through week 42, months 7 through 9. The third trimester is a time when the fetus is growing rapidly. At the end of the ninth month, the fetus is about 20 inches in length and weighs 6-10 pounds.  BODY CHANGES Your body goes through many changes during pregnancy. The changes vary from woman to woman.  Your weight will continue to increase. You can expect to gain 25-35 pounds (11-16 kg) by the end of the pregnancy. You may begin to get stretch [place] on your hips, abdomen,  and breasts. You may urinate more often because the fetus is moving lower into your pelvis and pressing on your bladder. You may develop or continue to have heartburn as a result of your pregnancy. You may develop constipation because certain hormones are causing the muscles that push waste through your intestines to slow down. You may develop hemorrhoids or swollen, bulging veins (varicose veins). You may have pelvic pain because of the weight gain and pregnancy hormones relaxing your joints between the bones in your pelvis. Backaches may result from overexertion of the muscles supporting your posture. You may have changes in your hair. These can include thickening of your hair, rapid growth, and changes in texture. Some women also have hair loss during or after pregnancy, or hair that feels dry or thin. Your hair will most likely return to normal after your baby is born. Your breasts will continue to grow and be tender. A yellow discharge may leak from your breasts called colostrum. Your belly button may stick out. You may   feel short of breath because of your expanding uterus. You may notice the fetus "dropping," or moving lower in your abdomen. You may have a bloody mucus discharge. This usually occurs a few days to a week before labor begins. Your cervix becomes thin and soft (effaced) near your due date. WHAT TO EXPECT AT YOUR PRENATAL EXAMS  You will have prenatal exams every 2 weeks until week 36. Then, you will have weekly prenatal exams. During a routine prenatal visit: You will be weighed to make sure you and the fetus are growing normally. Your blood pressure is taken. Your abdomen will be measured to track your baby's growth. The fetal heartbeat will be listened to. Any test results from the previous visit will be discussed. You may have a cervical check near your due date to see if you have effaced. At around 36 weeks, your caregiver will check your cervix. At the same time, your  caregiver will also perform a test on the secretions of the vaginal tissue. This test is to determine if a type of bacteria, Group B streptococcus, is present. Your caregiver will explain this further. Your caregiver may ask you: What your birth plan is. How you are feeling. If you are feeling the baby move. If you have had any abnormal symptoms, such as leaking fluid, bleeding, severe headaches, or abdominal cramping. If you have any questions. Other tests or screenings that may be performed during your third trimester include: Blood tests that check for low iron levels (anemia). Fetal testing to check the health, activity level, and growth of the fetus. Testing is done if you have certain medical conditions or if there are problems during the pregnancy. FALSE LABOR You may feel small, irregular contractions that eventually go away. These are called Braxton Hicks contractions, or false labor. Contractions may last for hours, days, or even weeks before true labor sets in. If contractions come at regular intervals, intensify, or become painful, it is best to be seen by your caregiver.  SIGNS OF LABOR  Menstrual-like cramps. Contractions that are 5 minutes apart or less. Contractions that start on the top of the uterus and spread down to the lower abdomen and back. A sense of increased pelvic pressure or back pain. A watery or bloody mucus discharge that comes from the vagina. If you have any of these signs before the 37th week of pregnancy, call your caregiver right away. You need to go to the hospital to get checked immediately. HOME CARE INSTRUCTIONS  Avoid all smoking, herbs, alcohol, and unprescribed drugs. These chemicals affect the formation and growth of the baby. Follow your caregiver's instructions regarding medicine use. There are medicines that are either safe or unsafe to take during pregnancy. Exercise only as directed by your caregiver. Experiencing uterine cramps is a good sign to  stop exercising. Continue to eat regular, healthy meals. Wear a good support bra for breast tenderness. Do not use hot tubs, steam rooms, or saunas. Wear your seat belt at all times when driving. Avoid raw meat, uncooked cheese, cat litter boxes, and soil used by cats. These carry germs that can cause birth defects in the baby. Take your prenatal vitamins. Try taking a stool softener (if your caregiver approves) if you develop constipation. Eat more high-fiber foods, such as fresh vegetables or fruit and whole grains. Drink plenty of fluids to keep your urine clear or pale yellow. Take warm sitz baths to soothe any pain or discomfort caused by hemorrhoids. Use hemorrhoid cream if   your caregiver approves. If you develop varicose veins, wear support hose. Elevate your feet for 15 minutes, 3-4 times a day. Limit salt in your diet. Avoid heavy lifting, wear low heal shoes, and practice good posture. Rest a lot with your legs elevated if you have leg cramps or low back pain. Visit your dentist if you have not gone during your pregnancy. Use a soft toothbrush to brush your teeth and be gentle when you floss. A sexual relationship may be continued unless your caregiver directs you otherwise. Do not travel far distances unless it is absolutely necessary and only with the approval of your caregiver. Take prenatal classes to understand, practice, and ask questions about the labor and delivery. Make a trial run to the hospital. Pack your hospital bag. Prepare the baby's nursery. Continue to go to all your prenatal visits as directed by your caregiver. SEEK MEDICAL CARE IF: You are unsure if you are in labor or if your water has broken. You have dizziness. You have mild pelvic cramps, pelvic pressure, or nagging pain in your abdominal area. You have persistent nausea, vomiting, or diarrhea. You have a bad smelling vaginal discharge. You have pain with urination. SEEK IMMEDIATE MEDICAL CARE IF:  You  have a fever. You are leaking fluid from your vagina. You have spotting or bleeding from your vagina. You have severe abdominal cramping or pain. You have rapid weight loss or gain. You have shortness of breath with chest pain. You notice sudden or extreme swelling of your face, hands, ankles, feet, or legs. You have not felt your baby move in over an hour. You have severe headaches that do not go away with medicine. You have vision changes. Document Released: 07/24/2001 Document Revised: 08/04/2013 Document Reviewed: 09/30/2012 [facility] Patient Information 2015 [place], [place]. This information is not intended to replace advice given to you by your health care provider. Make sure you discuss any questions you have with your health care provider.

## 2024-02-26 NOTE — Progress Notes (Signed)
 HIGH-RISK PREGNANCY VISIT Patient name: Stephanie Johns MRN [id number]  Date of birth: [birth year] Chief Complaint:   Routine Prenatal Visit  History of Present Illness:   Stephanie Johns is a 28 y.o. G71P1011 female at [redacted]w[redacted]d with an Estimated Date of Delivery: 04/30/24 being seen today for ongoing management of a high-risk pregnancy complicated by diabetes mellitus A1DM.    Today she reports no complaints. Contractions: Not present. Vag. Bleeding: None.  Movement: Present. denies leaking of fluid.      10/28/2023    3:52 PM 02/28/2023    3:31 PM 09/28/2021    9:35 AM 10/23/2019   11:07 AM 02/19/2018    3:02 PM  Depression screen PHQ 2/9  Decreased Interest 0 0 0 0 0  Down, Depressed, Hopeless 0 1 2 0 0  PHQ - 2 Score 0 1 2 0 0  Altered sleeping 2 2 2  1   Tired, decreased energy 0 1 0  0  Change in appetite 2 2 2   0  Feeling bad or failure about yourself  1 1 1   0  Trouble concentrating 2 3 1   0  Moving slowly or fidgety/restless 0 0 0  0  Suicidal thoughts 0 0 0  0  PHQ-9 Score 7 10 8  1         10/28/2023    3:52 PM 02/28/2023    3:31 PM 09/28/2021    9:35 AM  GAD 7 : Generalized Anxiety Score  Nervous, Anxious, on Edge 3 2 2   Control/stop worrying 3 2 2   Worry too much - different things 3 2 2   Trouble relaxing 3 2 2   Restless 3 2 2   Easily annoyed or irritable 3 3 2   Afraid - awful might happen 2 2 1   Total GAD 7 Score 20 15 13      Review of Systems:   Pertinent items are noted in HPI Denies abnormal vaginal discharge w/ itching/odor/irritation, headaches, visual changes, shortness of breath, chest pain, abdominal pain, severe nausea/vomiting, or problems with urination or bowel movements unless otherwise stated above. Pertinent History Reviewed:  Reviewed past medical,surgical, social, obstetrical and family history.  Reviewed problem list, medications and allergies. Physical Assessment:   Vitals:   02/26/24 1606  BP: 117/75  Pulse: 81  Weight: 212 lb (96.2  kg)  Body mass index is 33.2 kg/m.           Physical Examination:   General appearance: alert, well appearing, and in no distress  Mental status: alert, oriented to person, place, and time  Skin: warm & dry   Extremities:      Cardiovascular: normal heart rate noted  Respiratory: normal respiratory effort, no distress  Abdomen: gravid, soft, non-tender  Pelvic: Cervical exam deferred         Fetal Status: Fetal Heart Rate (bpm): 157 Fundal Height: 31 cm Movement: Present    Fetal Surveillance Testing today: doppler    No results found for this or any previous visit (from the past 24 hours).  Assessment & Plan:  High-risk pregnancy: G3P1011 at [redacted]w[redacted]d with an Estimated Date of Delivery: 04/30/24   1) A1GDM, has met with dietician; all fastings <95; single PP value 160 after [employer] but all others <120; will get EFW w next visit  2) Hx C/S for breech, had been leaning towards TOLAC, but now rethinking since she wants BTS; will continue the conversation  3) Hx DVT, daily Lovenox   Meds: No orders of the defined  types were placed in this encounter.   Labs/procedures today: none  Treatment Plan:  growth next visit; delivery 39-40  Reviewed: Preterm labor symptoms and general obstetric precautions including but not limited to vaginal bleeding, contractions, leaking of fluid and fetal movement were reviewed in detail with the patient.  All questions were answered. Does have home bp cuff. Office bp cuff given: not applicable. Check bp daily, let us  know if consistently >140 and/or >90.  Follow-up: Return in about 2 weeks (around 03/11/2024) for HROB, US : EFW (can be flexible w her vacation coming up).   Future Appointments  Date Time Provider Department Center  03/03/2024  4:30 PM Kristine Ronal BIRCH, RD [facility] None    Orders Placed This Encounter  Procedures   US  OB Follow Up   Suzen BIRCH Gentry [facility] 02/26/2024 4:41 PM

## 2024-03-03 ENCOUNTER — Encounter: Payer: Self-pay | Admitting: Women's Health

## 2024-03-03 ENCOUNTER — Encounter: Attending: Advanced Practice Midwife | Admitting: Nutrition

## 2024-03-03 ENCOUNTER — Encounter: Payer: Self-pay | Admitting: Nutrition

## 2024-03-03 VITALS — Ht 67.0 in | Wt 210.0 lb

## 2024-03-03 DIAGNOSIS — O24410 Gestational diabetes mellitus in pregnancy, diet controlled: Secondary | ICD-10-CM

## 2024-03-03 NOTE — Patient Instructions (Signed)
 Keep up the great job!

## 2024-03-03 NOTE — Progress Notes (Signed)
  Gestational DM Follow up. Start 1600 End 1615 Eating 3 meals and 2-3 snacks per day. Feels good.  FBS  range:  94-97 mg/dl  2 hr pp after meals 05-879'd.    Patient instructed to monitor glucose levels: FBS: 60 - <95 1 hour: <140 2 hour: <120  *Patient received handouts: Nutrition Diabetes and Pregnancy Carbohydrate Counting List  Patient will be seen for follow-up PRN.

## 2024-03-04 ENCOUNTER — Ambulatory Visit (INDEPENDENT_AMBULATORY_CARE_PROVIDER_SITE_OTHER)

## 2024-03-04 ENCOUNTER — Other Ambulatory Visit (HOSPITAL_COMMUNITY)
Admission: RE | Admit: 2024-03-04 | Discharge: 2024-03-04 | Disposition: A | Discharge status: Home / Self Care | Source: Ambulatory Visit | Attending: Obstetrics & Gynecology | Admitting: Obstetrics & Gynecology

## 2024-03-04 DIAGNOSIS — N9489 Other specified conditions associated with female genital organs and menstrual cycle: Secondary | ICD-10-CM | POA: Insufficient documentation

## 2024-03-04 DIAGNOSIS — N898 Other specified noninflammatory disorders of vagina: Secondary | ICD-10-CM | POA: Diagnosis present

## 2024-03-04 NOTE — Progress Notes (Signed)
   NURSE VISIT- VAGINITIS  SUBJECTIVE:  Stephanie Johns is a 28 y.o. G3P1011 [redacted]w[redacted]d pregnantfemale here for a vaginal swab for vaginitis screening.  She reports the following symptoms: burning and discharge described as clear for 3 days. Denies abnormal vaginal bleeding, significant pelvic pain, fever, or UTI symptoms.  OBJECTIVE:  LMP 07/25/2023   Appears well, in no apparent distress  ASSESSMENT: Vaginal swab for vaginitis screening  PLAN: Self-collected vaginal probe for Trichomonas, Bacterial Vaginosis sent to lab Treatment: to be determined once results are received Follow-up as needed if symptoms persist/worsen, or new symptoms develop  Aleck FORBES Blase  03/04/2024 2:12 PM

## 2024-03-06 ENCOUNTER — Other Ambulatory Visit: Payer: Self-pay | Admitting: Advanced Practice Midwife

## 2024-03-06 ENCOUNTER — Other Ambulatory Visit: Payer: Self-pay | Admitting: Obstetrics and Gynecology

## 2024-03-06 ENCOUNTER — Encounter: Payer: Self-pay | Admitting: Women's Health

## 2024-03-06 DIAGNOSIS — O24410 Gestational diabetes mellitus in pregnancy, diet controlled: Secondary | ICD-10-CM

## 2024-03-06 LAB — CERVICOVAGINAL ANCILLARY ONLY
Bacterial Vaginitis (gardnerella): POSITIVE — AB
Candida Glabrata: POSITIVE — AB
Candida Vaginitis: NEGATIVE
Comment: NEGATIVE
Comment: NEGATIVE
Comment: NEGATIVE

## 2024-03-06 MED ORDER — ONETOUCH ULTRASOFT LANCETS MISC: 12 refills | Status: DC

## 2024-03-06 MED ORDER — ONETOUCH VERIO REFLECT W/DEVICE KIT: PACK | 0 refills | Status: DC

## 2024-03-06 MED ORDER — GLUCOSE BLOOD VI STRP: ORAL_STRIP | 12 refills | Status: DC

## 2024-03-06 MED ORDER — METRONIDAZOLE 500 MG PO TABS: 500.0000 mg | ORAL_TABLET | Freq: Two times a day (BID) | ORAL | 0 refills | Status: AC

## 2024-03-06 MED ORDER — NYSTATIN 100000 UNIT/GM EX CREA
TOPICAL_CREAM | CUTANEOUS | 0 refills | Status: DC
Start: 2024-03-06 — End: 2024-03-16

## 2024-03-09 ENCOUNTER — Encounter: Payer: Self-pay | Admitting: Women's Health

## 2024-03-10 ENCOUNTER — Ambulatory Visit: Payer: Self-pay | Admitting: Women's Health

## 2024-03-10 ENCOUNTER — Ambulatory Visit: Admitting: *Deleted

## 2024-03-10 VITALS — BP 119/79 | HR 108

## 2024-03-10 DIAGNOSIS — Z3A32 32 weeks gestation of pregnancy: Secondary | ICD-10-CM | POA: Diagnosis not present

## 2024-03-10 DIAGNOSIS — O24410 Gestational diabetes mellitus in pregnancy, diet controlled: Secondary | ICD-10-CM

## 2024-03-10 DIAGNOSIS — O0993 Supervision of high risk pregnancy, unspecified, third trimester: Secondary | ICD-10-CM | POA: Diagnosis not present

## 2024-03-10 NOTE — Progress Notes (Signed)
   NURSE VISIT- NST  SUBJECTIVE:  Stephanie Johns is a 28 y.o. G73P1011 female at [redacted]w[redacted]d, here for a NST for pregnancy complicated by Diabetes: A1DM.  She reports more fetal movement than usual, contractions: none, vaginal bleeding: none, membranes: intact.   OBJECTIVE:  BP 119/79   Pulse (!) 108   LMP 07/25/2023   Appears well, no apparent distress  No results found for this or any previous visit (from the past 24 hours).  NST: FHR baseline 150 bpm, Variability: moderate, Accelerations:present, Decelerations:  Absent= Cat 1/reactive Toco: none   ASSESSMENT: G3P1011 at [redacted]w[redacted]d with Diabetes: A1DM NST reactive  PLAN: EFM strip reviewed by Luke Fetters, CNM, [facility]   Recommendations: keep appt as scheduled  Rutherford Rover  03/10/2024 3:00 PM

## 2024-03-16 ENCOUNTER — Ambulatory Visit

## 2024-03-16 ENCOUNTER — Ambulatory Visit: Admitting: Advanced Practice Midwife

## 2024-03-16 VITALS — BP 107/70 | HR 81 | Wt 209.0 lb

## 2024-03-16 DIAGNOSIS — Z86718 Personal history of other venous thrombosis and embolism: Secondary | ICD-10-CM

## 2024-03-16 DIAGNOSIS — Z98891 History of uterine scar from previous surgery: Secondary | ICD-10-CM | POA: Diagnosis not present

## 2024-03-16 DIAGNOSIS — O0993 Supervision of high risk pregnancy, unspecified, third trimester: Secondary | ICD-10-CM

## 2024-03-16 DIAGNOSIS — O24410 Gestational diabetes mellitus in pregnancy, diet controlled: Secondary | ICD-10-CM

## 2024-03-16 DIAGNOSIS — Z3A33 33 weeks gestation of pregnancy: Secondary | ICD-10-CM

## 2024-03-16 NOTE — Progress Notes (Signed)
 US  33+4 wks,cephalic,anterior placenta gr 3,AFI 13 cm,FHR 140 bpm,EFW 2211 g 41%

## 2024-03-16 NOTE — Patient Instructions (Signed)
 Josette LOISE Cough, I greatly value your feedback.  If you receive a survey following your visit with us  today, we appreciate you taking the time to fill it out.  Thanks, Sherrell Ely, DNP, CNM  [facility] HAS [facility]ED!!! It is now [facility] at [facility] ([address] [place], [place] [address]) Entrance located off of [address] Free 24/7 valet parking   Go to [email or web address] to register for FREE online childbirth classes    Call the office [phone]) or go to [facility] if: You begin to have strong, frequent contractions Your water breaks.  Sometimes it is a big gush of fluid, sometimes it is just a trickle that keeps getting your panties wet or running down your legs You have vaginal bleeding.  It is normal to have a small amount of spotting if your cervix was checked.  You don't feel your baby moving like normal.  If you don't, get you something to eat and drink and lay down and focus on feeling your baby move.  You should feel at least 10 movements in 2 hours.  If you don't, you should call the office or go to [facility].   Home Blood Pressure Monitoring for Patients   Your provider has recommended that you check your blood pressure (BP) at least once a week at home. If you do not have a blood pressure cuff at home, one will be provided for you. Contact your provider if you have not received your monitor within 1 week.   Helpful Tips for Accurate Home Blood Pressure Checks  Don't smoke, exercise, or drink caffeine 30 minutes before checking your BP Use the restroom before checking your BP (a full bladder can raise your pressure) Relax in a comfortable upright chair Feet on the ground Left arm resting comfortably on a flat surface at the level of your heart Legs uncrossed Back supported Sit quietly and don't talk Place the cuff on your bare arm Adjust snuggly, so that only two fingertips can fit between your skin and the top  of the cuff Check 2 readings separated by at least one minute Keep a log of your BP readings For a visual, please reference this diagram: http://ccnc.care/bpdiagram  Provider Name: [facility]     Phone: [phone]  Zone 1: ALL CLEAR  Continue to monitor your symptoms:  BP reading is less than 140 (top number) or less than 90 (bottom number)  No right upper stomach pain No headaches or seeing spots No feeling nauseated or throwing up No swelling in face and hands  Zone 2: CAUTION Call your doctor's office for any of the following:  BP reading is greater than 140 (top number) or greater than 90 (bottom number)  Stomach pain under your ribs in the middle or right side Headaches or seeing spots Feeling nauseated or throwing up Swelling in face and hands  Zone 3: EMERGENCY  Seek immediate medical care if you have any of the following:  BP reading is greater than160 (top number) or greater than 110 (bottom number) Severe headaches not improving with Tylenol  Serious difficulty catching your breath Any worsening symptoms from Zone 2

## 2024-03-16 NOTE — Progress Notes (Signed)
 HIGH-RISK PREGNANCY VISIT Patient name: Stephanie Johns MRN [id number]  Date of birth: 10-Jul-1996 Chief Complaint:   Routine Prenatal Visit  History of Present Illness:   Stephanie Johns is a 28 y.o. G97P1011 female at [redacted]w[redacted]d with an Estimated Date of Delivery: 04/30/24 being seen today for ongoing management of a high-risk pregnancy complicated by diabetes mellitus A1DM.    Today she reports FBS <95 and 2 hr pp 120 except for 1. no complaints. Contractions: Not present. Vag. Bleeding: None.  Movement: Present. denies leaking of fluid.      10/28/2023    3:52 PM 02/28/2023    3:31 PM 09/28/2021    9:35 AM 10/23/2019   11:07 AM 02/19/2018    3:02 PM  Depression screen PHQ 2/9  Decreased Interest 0 0 0 0 0  Down, Depressed, Hopeless 0 1 2 0 0  PHQ - 2 Score 0 1 2 0 0  Altered sleeping 2 2 2  1   Tired, decreased energy 0 1 0  0  Change in appetite 2 2 2   0  Feeling bad or failure about yourself  1 1 1   0  Trouble concentrating 2 3 1   0  Moving slowly or fidgety/restless 0 0 0  0  Suicidal thoughts 0 0 0  0  PHQ-9 Score 7 10 8  1         10/28/2023    3:52 PM 02/28/2023    3:31 PM 09/28/2021    9:35 AM  GAD 7 : Generalized Anxiety Score  Nervous, Anxious, on Edge 3 2 2   Control/stop worrying 3 2 2   Worry too much - different things 3 2 2   Trouble relaxing 3 2 2   Restless 3 2 2   Easily annoyed or irritable 3 3 2   Afraid - awful might happen 2 2 1   Total GAD 7 Score 20 15 13      Review of Systems:   Pertinent items are noted in HPI Denies abnormal vaginal discharge w/ itching/odor/irritation, headaches, visual changes, shortness of breath, chest pain, abdominal pain, severe nausea/vomiting, or problems with urination or bowel movements unless otherwise stated above. Pertinent History Reviewed:  Reviewed past medical,surgical, social, obstetrical and family history.  Reviewed problem list, medications and allergies. Physical Assessment:   Vitals:   03/16/24 0912  BP:  107/70  Pulse: 81  Weight: 209 lb (94.8 kg)  Body mass index is 32.73 kg/m.           Physical Examination:   General appearance: alert, well appearing, and in no distress  Mental status: alert, oriented to person, place, and time  Skin: warm & dry   Extremities:      Cardiovascular: normal heart rate noted  Respiratory: normal respiratory effort, no distress  Abdomen: gravid, soft, non-tender  Pelvic: Cervical exam deferred         Chaperone: N/A    Fetal Status:     Movement: Present    Fetal Surveillance Testing today: US  33+4 wks,cephalic,anterior placenta gr 3,AFI 13 cm,FHR 140 bpm,EFW 2211 g 41%      No results found for this or any previous visit (from the past 24 hours).  Assessment & Plan:  High-risk pregnancy: G3P1011 at [redacted]w[redacted]d with an Estimated Date of Delivery: 04/30/24   1. Encounter for supervision of high risk pregnancy in third trimester, antepartum (Primary)   2. Diet controlled gestational diabetes mellitus (GDM) in third trimester Sugars normal  3. History of cesarean delivery Still undecided  about VBAC vs CS  4. History of DVT (deep vein thrombosis) Continue lovenox     Meds: No orders of the defined types were placed in this encounter.   Orders:  Orders Placed This Encounter  Procedures   US  OB Follow Up     Labs/procedures today: none   Reviewed: Preterm labor symptoms and general obstetric precautions including but not limited to vaginal bleeding, contractions, leaking of fluid and fetal movement were reviewed in detail with the patient.  All questions were answered. Does have home bp cuff. Office bp cuff given: not applicable. Check bp weekly, let us  know if consistently >140 and/or >90.  Follow-up: Return in about 2 weeks (around 03/30/2024) for HROB w/MD only to sign VBAC papers; 5 weeks for US  for EFW.   Future Appointments  Date Time Provider Department Center  03/16/2024  9:30 AM Cresenzo-Dishmon, Cathlean, CNM [facility]     Orders Placed This Encounter  Procedures   US  OB Follow Up   Cathlean Ely , DNP, CNM [facility] 03/16/2024 9:26 AM

## 2024-03-30 ENCOUNTER — Encounter: Payer: Self-pay | Admitting: Obstetrics & Gynecology

## 2024-03-30 ENCOUNTER — Ambulatory Visit: Admitting: Obstetrics & Gynecology

## 2024-03-30 VITALS — BP 114/70 | HR 93 | Wt 207.4 lb

## 2024-03-30 DIAGNOSIS — Z86718 Personal history of other venous thrombosis and embolism: Secondary | ICD-10-CM

## 2024-03-30 DIAGNOSIS — Z98891 History of uterine scar from previous surgery: Secondary | ICD-10-CM

## 2024-03-30 DIAGNOSIS — O24410 Gestational diabetes mellitus in pregnancy, diet controlled: Secondary | ICD-10-CM

## 2024-03-30 DIAGNOSIS — O0993 Supervision of high risk pregnancy, unspecified, third trimester: Secondary | ICD-10-CM

## 2024-03-30 DIAGNOSIS — Z3A35 35 weeks gestation of pregnancy: Secondary | ICD-10-CM

## 2024-03-30 NOTE — Progress Notes (Signed)
 HIGH-RISK PREGNANCY VISIT Patient name: Stephanie Johns MRN [id number]  Date of birth: [birth year] Chief Complaint:   Routine Prenatal Visit  History of Present Illness:   Stephanie Johns is a 28 y.o. G17P1011 female at [redacted]w[redacted]d with an Estimated Date of Delivery: 04/30/24 being seen today for ongoing management of a high-risk pregnancy complicated by:  -GDMA1 -h/o DVT- currently on Lovenox  -Prior C-section  Still having issues with vaginal yeast, but managing.  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 Vag. Bleeding: None.  Movement: Present. denies leaking of fluid.      10/28/2023    3:52 PM 02/28/2023    3:31 PM 09/28/2021    9:35 AM 10/23/2019   11:07 AM 02/19/2018    3:02 PM  Depression screen PHQ 2/9  Decreased Interest 0 0 0 0 0  Down, Depressed, Hopeless 0 1 2 0 0  PHQ - 2 Score 0 1 2 0 0  Altered sleeping 2 2 2  1   Tired, decreased energy 0 1 0  0  Change in appetite 2 2 2   0  Feeling bad or failure about yourself  1 1 1   0  Trouble concentrating 2 3 1   0  Moving slowly or fidgety/restless 0 0 0  0  Suicidal thoughts 0 0 0  0  PHQ-9 Score 7 10 8  1      Current Outpatient Medications  Medication Instructions   Accu-Chek Softclix Lancets lancets Use as instructed to check blood sugar 4 times daily   Blood Glucose Monitoring Suppl (ACCU-CHEK GUIDE ME) w/Device KIT 1 each, Does not apply, 4 times daily   Blood Glucose Monitoring Suppl (ONETOUCH VERIO REFLECT) w/Device KIT Use to check blood glucose four times daily   enoxaparin  (LOVENOX ) 60 mg, Subcutaneous, Every 24 hours   glucose blood test strip Use as instructed to check blood sugar four times daily   glucose blood test strip Use as instructed to check blood sugar four times daily   Lancets (ONETOUCH ULTRASOFT) lancets Use as directed to check blood sugar 4 times daily   Prenatal Vit-Fe Fumarate-FA (PRENATAL VITAMIN PO) Take by mouth.     Review of Systems:   Pertinent items are noted in  HPI Denies  headaches, visual changes, shortness of breath, chest pain, abdominal pain, severe nausea/vomiting, or problems with urination or bowel movements unless otherwise stated above. Pertinent History Reviewed:  Reviewed past medical,surgical, social, obstetrical and family history.  Reviewed problem list, medications and allergies. Physical Assessment:   Vitals:   03/30/24 0852  BP: 114/70  Pulse: 93  Weight: 207 lb 6.4 oz (94.1 kg)  Body mass index is 32.48 kg/m.           Physical Examination:   General appearance: alert, well appearing, and in no distress  Mental status: normal mood, behavior, speech, dress, motor activity, and thought processes  Skin: warm & dry   Extremities:      Cardiovascular: normal heart rate noted  Respiratory: normal respiratory effort, no distress  Abdomen: gravid, soft, non-tender  Pelvic: Cervical exam deferred         Fetal Status: Fetal Heart Rate (bpm): 150 Fundal Height: 34 cm Movement: Present    Fetal Surveillance Testing today: doppler   Chaperone: N/A    No results found for this or any previous visit (from the past 24 hours).   Assessment & Plan:  High-risk pregnancy: G3P1011 at [redacted]w[redacted]d with an Estimated Date of Delivery: 04/30/24   -  GDMA1 Well-controlled with diet -h/o DVT- currently on Lovenox  -Prior C-section - We discussed her history of c-section. Her previous c-section was due to  fetal malpresentation.  She has a history of  no prior successful vaginal deliveries - We discussed the risks associated with repeat c-section: bleeding, infection, injury to surrounding organs/tissues I.e. bowel/bladder, development of scar tissue, wound complications such as wound separation or infection, need for additional surgery and discussed potential for future complications such as placenta accreta spectrum  nm- We discussed the risks associated with TOLAC specifically focusing on the risk of uterine rupture. We discussed with the risk of  uterine rupture that while rare it is not easily predicted, that it is a surgical emergency, and it can be potentially catastrophic for mom and baby.  - After counseling, the patient was given the opportunity to ask questions and all questions answered.  - After considering her options, she would like to have a repeat c-section - Information provided to the patient  Meds: No orders of the defined types were placed in this encounter.   Labs/procedures today: none  Treatment Plan: Routine OB care.  After much discussion as above patient plans for repeat C-section with bilateral salpingectomy.  Scheduled for September 12, referral sent to schedule   Reviewed: Preterm labor symptoms and general obstetric precautions including but not limited to vaginal bleeding, contractions, leaking of fluid and fetal movement were reviewed in detail with the patient.  All questions were answered.   Follow-up: Return in about 1 week (around 04/06/2024) for HROB visit (Ok for CNM).   Future Appointments  Date Time Provider Department Center  04/06/2024  1:50 PM Kizzie Suzen SA[place], [place] [facility]  04/14/2024  2:10 PM Kizzie Suzen SAUNDERS, CNM [facility]  04/21/2024  8:30 AM [facility] US  [facility] None  04/21/2024  9:30 AM Stori Royse, DO [facility]    Orders Placed This Encounter  Procedures   Ambulatory Referral For Surgery Scheduling    Tru Rana, DO Attending Obstetrician & Gynecologist, [facility] for [employer], [facility]

## 2024-04-06 ENCOUNTER — Other Ambulatory Visit (HOSPITAL_COMMUNITY)
Admission: RE | Admit: 2024-04-06 | Discharge: 2024-04-06 | Disposition: A | Discharge status: Home / Self Care | Source: Ambulatory Visit | Attending: Women's Health | Admitting: Women's Health

## 2024-04-06 ENCOUNTER — Encounter: Payer: Self-pay | Admitting: Women's Health

## 2024-04-06 ENCOUNTER — Ambulatory Visit (INDEPENDENT_AMBULATORY_CARE_PROVIDER_SITE_OTHER): Admitting: Women's Health

## 2024-04-06 VITALS — BP 120/76 | HR 82 | Wt 208.4 lb

## 2024-04-06 DIAGNOSIS — Z86718 Personal history of other venous thrombosis and embolism: Secondary | ICD-10-CM | POA: Diagnosis not present

## 2024-04-06 DIAGNOSIS — O24410 Gestational diabetes mellitus in pregnancy, diet controlled: Secondary | ICD-10-CM

## 2024-04-06 DIAGNOSIS — Z3A36 36 weeks gestation of pregnancy: Secondary | ICD-10-CM | POA: Insufficient documentation

## 2024-04-06 DIAGNOSIS — O0993 Supervision of high risk pregnancy, unspecified, third trimester: Secondary | ICD-10-CM | POA: Diagnosis present

## 2024-04-06 NOTE — Patient Instructions (Signed)
Gean, thank you for choosing our office today! We appreciate the opportunity to meet your healthcare needs. You may receive a short survey by mail, e-mail, or through MyChart. If you are happy with your care we would appreciate if you could take just a few minutes to complete the survey questions. We read all of your comments and take your feedback very seriously. Thank you again for choosing our office.  [facility] [place] ([address] [place], [place] [address]) Entrance C, located off of [address] Free 24/7 valet parking   CLASSES: Go to [email or web address] to register for classes (childbirth, breastfeeding, waterbirth, infant CPR, daddy bootcamp, etc.)  Call the office ([phone]) or go to [facility] if: You begin to have strong, frequent contractions Your water breaks.  Sometimes it is a big gush of fluid, sometimes it is just a trickle that keeps getting your panties wet or running down your legs You have vaginal bleeding.  It is normal to have a small amount of spotting if your cervix was checked.  You don't feel your baby moving like normal.  If you don't, get you something to eat and drink and lay down and focus on feeling your baby move.   If your baby is still not moving like normal, you should call the office or go to [facility].  Call the office ([phone]) or go to [facility] for these signs of pre-eclampsia: Severe headache that does not go away with Tylenol Visual changes- seeing spots, double, blurred vision Pain under your right breast or upper abdomen that does not go away with Tums or heartburn medicine Nausea and/or vomiting Severe swelling in your hands, feet, and face   Tdap Vaccine It is recommended that you get the Tdap vaccine during the third trimester of EACH pregnancy to help protect your baby from getting pertussis (whooping cough) 27-36 weeks is the BEST time to do  this so that you can pass the protection on to your baby. During pregnancy is better than after pregnancy, but if you are unable to get it during pregnancy it will be offered at the hospital.  You can get this vaccine with us, at the health department, your family doctor, or some local pharmacies Everyone who will be around your baby should also be up-to-date on their vaccines before the baby comes. Adults (who are not pregnant) only need 1 dose of Tdap during adulthood.   [place] Pediatricians/Family Doctors [facility] ([facility]): [address], [phone]           [facility]: [address], [phone]                [facility] ([facility]): [address], [phone] (call to ask if accepting patients) [facility]: [address], [place], [phone]    [place] Pediatricians/Family Doctors [facility] ([facility]): [address], [phone] [facility]: [address], [phone] [facility]: [address], [phone]  [place] [facility]  [facility] ([facility]): [phone] [facility]: [address], [phone]   [place] Family Doctors [facility]: [address], [phone]  [place] [facility]  [facility]: [address], [phone]  Home Blood Pressure Monitoring for Patients   Your provider has recommended that you check your   blood pressure (BP) at least once a week at home. If you do not have a blood pressure cuff at home, one will be provided for you. Contact your provider if you have not received your monitor within 1 week.   Helpful Tips for Accurate Home Blood Pressure Checks  Don't smoke, exercise, or drink caffeine 30 minutes before checking your BP Use the restroom before checking your BP (a full bladder can raise your  pressure) Relax in a comfortable upright chair Feet on the ground Left arm resting comfortably on a flat surface at the level of your heart Legs uncrossed Back supported Sit quietly and don't talk Place the cuff on your bare arm Adjust snuggly, so that only two fingertips can fit between your skin and the top of the cuff Check 2 readings separated by at least one minute Keep a log of your BP readings For a visual, please reference this diagram: http://ccnc.care/bpdiagram  Provider Name: [facility]     Phone: [phone]  Zone 1: ALL CLEAR  Continue to monitor your symptoms:  BP reading is less than 140 (top number) or less than 90 (bottom number)  No right upper stomach pain No headaches or seeing spots No feeling nauseated or throwing up No swelling in face and hands  Zone 2: CAUTION Call your doctor's office for any of the following:  BP reading is greater than 140 (top number) or greater than 90 (bottom number)  Stomach pain under your ribs in the middle or right side Headaches or seeing spots Feeling nauseated or throwing up Swelling in face and hands  Zone 3: EMERGENCY  Seek immediate medical care if you have any of the following:  BP reading is greater than160 (top number) or greater than 110 (bottom number) Severe headaches not improving with Tylenol Serious difficulty catching your breath Any worsening symptoms from Zone 2  Preterm Labor and Birth Information  The normal length of a pregnancy is 39-41 weeks. Preterm labor is when labor starts before 37 completed weeks of pregnancy. What are the risk factors for preterm labor? Preterm labor is more likely to occur in women who: Have certain infections during pregnancy such as a bladder infection, sexually transmitted infection, or infection inside the uterus (chorioamnionitis). Have a shorter-than-normal cervix. Have gone into preterm labor before. Have had surgery on their cervix. Are younger than age 17  or older than age 35. Are African American. Are pregnant with twins or multiple babies (multiple gestation). Take street drugs or smoke while pregnant. Do not gain enough weight while pregnant. Became pregnant shortly after having been pregnant. What are the symptoms of preterm labor? Symptoms of preterm labor include: Cramps similar to those that can happen during a menstrual period. The cramps may happen with diarrhea. Pain in the abdomen or lower back. Regular uterine contractions that may feel like tightening of the abdomen. A feeling of increased pressure in the pelvis. Increased watery or bloody mucus discharge from the vagina. Water breaking (ruptured amniotic sac). Why is it important to recognize signs of preterm labor? It is important to recognize signs of preterm labor because babies who are born prematurely may not be fully developed. This can put them at an increased risk for: Long-term (chronic) heart and lung problems. Difficulty immediately after birth with regulating body systems, including blood sugar, body temperature, heart rate, and breathing rate. Bleeding in the brain. Cerebral palsy. Learning difficulties. Death. These risks are highest for babies who are born before 34 weeks   of pregnancy. How is preterm labor treated? Treatment depends on the length of your pregnancy, your condition, and the health of your baby. It may involve: Having a stitch (suture) placed in your cervix to prevent your cervix from opening too early (cerclage). Taking or being given medicines, such as: Hormone medicines. These may be given early in pregnancy to help support the pregnancy. Medicine to stop contractions. Medicines to help mature the baby's lungs. These may be prescribed if the risk of delivery is high. Medicines to prevent your baby from developing cerebral palsy. If the labor happens before 34 weeks of pregnancy, you may need to stay in the hospital. What should I do if I  think I am in preterm labor? If you think that you are going into preterm labor, call your health care provider right away. How can I prevent preterm labor in future pregnancies? To increase your chance of having a full-term pregnancy: Do not use any tobacco products, such as cigarettes, chewing tobacco, and e-cigarettes. If you need help quitting, ask your health care provider. Do not use street drugs or medicines that have not been prescribed to you during your pregnancy. Talk with your health care provider before taking any herbal supplements, even if you have been taking them regularly. Make sure you gain a healthy amount of weight during your pregnancy. Watch for infection. If you think that you might have an infection, get it checked right away. Make sure to tell your health care provider if you have gone into preterm labor before. This information is not intended to replace advice given to you by your health care provider. Make sure you discuss any questions you have with your health care provider. Document Revised: 11/21/2018 Document Reviewed: 12/21/2015 Elsevier Patient Education  2020 Elsevier Inc.   

## 2024-04-06 NOTE — Progress Notes (Signed)
 HIGH-RISK PREGNANCY VISIT Patient name: Stephanie Johns MRN [id number]  Date of birth: [birth year] Chief Complaint:   Routine Prenatal Visit  History of Present Illness:   Stephanie Johns is a 28 y.o. G51P1011 female at [redacted]w[redacted]d with an Estimated Date of Delivery: 04/30/24 being seen today for ongoing management of a high-risk pregnancy complicated by diabetes mellitus A1DM.    Today she reports all fastings wnl, one 2hr 140s (ate [employer] fried chicken salad). Contractions: Irritability. Vag. Bleeding: None.  Movement: Present. denies leaking of fluid.      10/28/2023    3:52 PM 02/28/2023    3:31 PM 09/28/2021    9:35 AM 10/23/2019   11:07 AM 02/19/2018    3:02 PM  Depression screen PHQ 2/9  Decreased Interest 0 0 0 0 0  Down, Depressed, Hopeless 0 1 2 0 0  PHQ - 2 Score 0 1 2 0 0  Altered sleeping 2 2 2  1   Tired, decreased energy 0 1 0  0  Change in appetite 2 2 2   0  Feeling bad or failure about yourself  1 1 1   0  Trouble concentrating 2 3 1   0  Moving slowly or fidgety/restless 0 0 0  0  Suicidal thoughts 0 0 0  0  PHQ-9 Score 7 10 8  1         10/28/2023    3:52 PM 02/28/2023    3:31 PM 09/28/2021    9:35 AM  GAD 7 : Generalized Anxiety Score  Nervous, Anxious, on Edge 3 2 2   Control/stop worrying 3 2 2   Worry too much - different things 3 2 2   Trouble relaxing 3 2 2   Restless 3 2 2   Easily annoyed or irritable 3 3 2   Afraid - awful might happen 2 2 1   Total GAD 7 Score 20 15 13      Review of Systems:   Pertinent items are noted in HPI Denies abnormal vaginal discharge w/ itching/odor/irritation, headaches, visual changes, shortness of breath, chest pain, abdominal pain, severe nausea/vomiting, or problems with urination or bowel movements unless otherwise stated above. Pertinent History Reviewed:  Reviewed past medical,surgical, social, obstetrical and family history.  Reviewed problem list, medications and allergies. Physical Assessment:   Vitals:   04/06/24  1356  BP: 120/76  Pulse: 82  Weight: 208 lb 6.4 oz (94.5 kg)  Body mass index is 32.64 kg/m.           Physical Examination:   General appearance: alert, well appearing, and in no distress  Mental status: alert, oriented to person, place, and time  Skin: warm & dry   Extremities:      Cardiovascular: normal heart rate noted  Respiratory: normal respiratory effort, no distress  Abdomen: gravid, soft, non-tender  Pelvic: Cervical exam performed  Dilation: Closed Effacement (%): Thick Station: Ballotable  Fetal Status: Fetal Heart Rate (bpm): 150 Fundal Height: 34 cm Movement: Present Presentation: Vertex  Fetal Surveillance Testing today: doppler   Chaperone: Aleck Blase  No results found for this or any previous visit (from the past 24 hours).  Assessment & Plan:  High-risk pregnancy: G3P1011 at [redacted]w[redacted]d with an Estimated Date of Delivery: 04/30/24   1) A1DM, stable, EFW 41% w/ normal AFI at 33.4  2) Prev c/s, for RCS w/ BTS (no mcaid) 9/13 as scheduled  3) H/O DVT> on lovenox   Meds: No orders of the defined types were placed in this encounter.   Labs/procedures today:  GBS, GC/CT, and SVE  Treatment Plan:  EFW q 4w    No antenatal testing as long as remains off meds     Deliver @ 39wks:____   Reviewed: Preterm labor symptoms and general obstetric precautions including but not limited to vaginal bleeding, contractions, leaking of fluid and fetal movement were reviewed in detail with the patient.  All questions were answered. Does have home bp cuff. Office bp cuff given: not applicable. Check bp weekly, let us  know if consistently >140 and/or >90.  Follow-up: Return for As scheduled.   Future Appointments  Date Time Provider Department Center  04/14/2024  2:10 PM Kizzie Suzen SAUNDERS, [place] [facility]  04/21/2024  8:30 AM [facility] US  [facility] None  04/21/2024  9:30 AM Ozan, Jennifer, DO [facility]    Orders Placed This Encounter  Procedures   Culture, beta  strep (group b only)   Suzen SAUNDERS Kizzie CNM, [facility] 04/06/2024 2:18 PM

## 2024-04-08 LAB — CERVICOVAGINAL ANCILLARY ONLY
Chlamydia: NEGATIVE
Comment: NEGATIVE
Comment: NORMAL
Neisseria Gonorrhea: NEGATIVE

## 2024-04-09 ENCOUNTER — Encounter: Payer: Self-pay | Admitting: Women's Health

## 2024-04-09 ENCOUNTER — Encounter: Payer: Self-pay | Admitting: *Deleted

## 2024-04-10 ENCOUNTER — Other Ambulatory Visit

## 2024-04-10 DIAGNOSIS — Z3A37 37 weeks gestation of pregnancy: Secondary | ICD-10-CM

## 2024-04-10 DIAGNOSIS — L299 Pruritus, unspecified: Secondary | ICD-10-CM

## 2024-04-10 LAB — CULTURE, BETA STREP (GROUP B ONLY)

## 2024-04-12 LAB — COMPREHENSIVE METABOLIC PANEL WITH GFR
ALT: 16 IU/L (ref 0–32)
AST: 16 IU/L (ref 0–40)
Albumin: 3.6 g/dL — ABNORMAL LOW (ref 4.0–5.0)
Alkaline Phosphatase: 126 IU/L — ABNORMAL HIGH (ref 44–121)
BUN/Creatinine Ratio: 17 (ref 9–23)
BUN: 11 mg/dL (ref 6–20)
Bilirubin Total: <0.2 mg/dL (ref 0.0–1.2)
CO2: 17 mmol/L — ABNORMAL LOW (ref 20–29)
Calcium: 9.2 mg/dL (ref 8.7–10.2)
Chloride: 106 mmol/L (ref 96–106)
Creatinine, Ser: 0.65 mg/dL (ref 0.57–1.00)
Globulin, Total: 2.6 g/dL (ref 1.5–4.5)
Glucose: 95 mg/dL (ref 70–99)
Potassium: 4.5 mmol/L (ref 3.5–5.2)
Sodium: 138 mmol/L (ref 134–144)
Total Protein: 6.2 g/dL (ref 6.0–8.5)
eGFR: 124 mL/min/1.73 (ref >59)

## 2024-04-12 LAB — BILE ACIDS, TOTAL: Bile Acids Total: 3.7 umol/L (ref 0.0–10.0)

## 2024-04-14 ENCOUNTER — Encounter: Payer: Self-pay | Admitting: Women's Health

## 2024-04-14 ENCOUNTER — Encounter (HOSPITAL_COMMUNITY): Payer: Self-pay

## 2024-04-14 ENCOUNTER — Ambulatory Visit (INDEPENDENT_AMBULATORY_CARE_PROVIDER_SITE_OTHER): Admitting: Women's Health

## 2024-04-14 VITALS — BP 128/80 | HR 86 | Wt 207.3 lb

## 2024-04-14 DIAGNOSIS — O0993 Supervision of high risk pregnancy, unspecified, third trimester: Secondary | ICD-10-CM

## 2024-04-14 DIAGNOSIS — Z98891 History of uterine scar from previous surgery: Secondary | ICD-10-CM

## 2024-04-14 DIAGNOSIS — Z86718 Personal history of other venous thrombosis and embolism: Secondary | ICD-10-CM

## 2024-04-14 DIAGNOSIS — O24410 Gestational diabetes mellitus in pregnancy, diet controlled: Secondary | ICD-10-CM | POA: Diagnosis not present

## 2024-04-14 DIAGNOSIS — Z3A37 37 weeks gestation of pregnancy: Secondary | ICD-10-CM

## 2024-04-14 DIAGNOSIS — Z7901 Long term (current) use of anticoagulants: Secondary | ICD-10-CM

## 2024-04-14 NOTE — Progress Notes (Signed)
 HIGH-RISK PREGNANCY VISIT Patient name: Stephanie Johns MRN [id number]  Date of birth: 06-27-1996 Chief Complaint:   Routine Prenatal Visit  History of Present Illness:   Stephanie Johns is a 28 y.o. G4P1011 female at [redacted]w[redacted]d with an Estimated Date of Delivery: 04/30/24 being seen today for ongoing management of a high-risk pregnancy complicated by diabetes mellitus A1DM and h/o DVT on Lovenox .    Today she reports all sugars wnl. Contractions: Irritability. Vag. Bleeding: None.  Movement: Present. denies leaking of fluid.      10/28/2023    3:52 PM 02/28/2023    3:31 PM 09/28/2021    9:35 AM 10/23/2019   11:07 AM 02/19/2018    3:02 PM  Depression screen PHQ 2/9  Decreased Interest 0 0 0 0 0  Down, Depressed, Hopeless 0 1 2 0 0  PHQ - 2 Score 0 1 2 0 0  Altered sleeping 2 2 2  1   Tired, decreased energy 0 1 0  0  Change in appetite 2 2 2   0  Feeling bad or failure about yourself  1 1 1   0  Trouble concentrating 2 3 1   0  Moving slowly or fidgety/restless 0 0 0  0  Suicidal thoughts 0 0 0  0  PHQ-9 Score 7 10 8  1         10/28/2023    3:52 PM 02/28/2023    3:31 PM 09/28/2021    9:35 AM  GAD 7 : Generalized Anxiety Score  Nervous, Anxious, on Edge 3 2 2   Control/stop worrying 3 2 2   Worry too much - different things 3 2 2   Trouble relaxing 3 2 2   Restless 3 2 2   Easily annoyed or irritable 3 3 2   Afraid - awful might happen 2 2 1   Total GAD 7 Score 20 15 13      Review of Systems:   Pertinent items are noted in HPI Denies abnormal vaginal discharge w/ itching/odor/irritation, headaches, visual changes, shortness of breath, chest pain, abdominal pain, severe nausea/vomiting, or problems with urination or bowel movements unless otherwise stated above. Pertinent History Reviewed:  Reviewed past medical,surgical, social, obstetrical and family history.  Reviewed problem list, medications and allergies. Physical Assessment:   Vitals:   04/14/24 1421  BP: 128/80  Pulse: 86   Weight: 207 lb 4.8 oz (94 kg)  Body mass index is 32.47 kg/m.           Physical Examination:   General appearance: alert, well appearing, and in no distress  Mental status: alert, oriented to person, place, and time  Skin: warm & dry   Extremities: Edema: Trace    Cardiovascular: normal heart rate noted  Respiratory: normal respiratory effort, no distress  Abdomen: gravid, soft, non-tender  Pelvic: Cervical exam deferred         Fetal Status: Fetal Heart Rate (bpm): 140 Fundal Height: 35 cm Movement: Present    Fetal Surveillance Testing today: doppler   Chaperone: N/A  No results found for this or any previous visit (from the past 24 hours).  Assessment & Plan:  High-risk pregnancy: G3P1011 at [redacted]w[redacted]d with an Estimated Date of Delivery: 04/30/24   1) A1DM, stable, EFW 41% w/ normal AFI @ 33.4w, repeat next week  2) H/O DVT on Lovenox , per Dr. Jayne stop 24hr prior to C/S  3) Prev c/s> for RCS w/ BTS on 9/13 as scheduled  Meds: No orders of the defined types were placed in this  encounter.   Labs/procedures today: none  Treatment Plan:  EFW next week, RCS 9/13  Reviewed: Term labor symptoms and general obstetric precautions including but not limited to vaginal bleeding, contractions, leaking of fluid and fetal movement were reviewed in detail with the patient.  All questions were answered. Does have home bp cuff. Office bp cuff given: not applicable. Check bp weekly, let us  know if consistently >140 and/or >90.  Follow-up: Return for As scheduled.   Future Appointments  Date Time Provider Department Center  04/21/2024  8:30 AM [facility] - [facility] US  [facility] None  04/21/2024  9:30 AM Ozan, Jennifer, DO [facility]  04/23/2024  9:00 AM [facility] PAT 1 [facility] None    No orders of the defined types were placed in this encounter.  Suzen JONELLE Fetters CNM, [facility] 04/14/2024 3:00 PM

## 2024-04-14 NOTE — Patient Instructions (Signed)
 Stephanie Johns, thank you for choosing our office today! We appreciate the opportunity to meet your healthcare needs. You may receive a short survey by mail, e-mail, or through [employer]. If you are happy with your care we would appreciate if you could take just a few minutes to complete the survey questions. We read all of your comments and take your feedback very seriously. Thank you again for choosing our office.  Center for [employer] Team at [facility]  [facility] ([address] [place], [place] [address]) Entrance C, located off of [address] Free 24/7 valet parking   CLASSES: Go to [email or web address] to register for classes (childbirth, breastfeeding, waterbirth, infant CPR, daddy bootcamp, etc.)  Call the office [phone]) or go to [facility] if: You begin to have strong, frequent contractions Your water breaks.  Sometimes it is a big gush of fluid, sometimes it is just a trickle that keeps getting your panties wet or running down your legs You have vaginal bleeding.  It is normal to have a small amount of spotting if your cervix was checked.  You don't feel your baby moving like normal.  If you don't, get you something to eat and drink and lay down and focus on feeling your baby move.   If your baby is still not moving like normal, you should call the office or go to [facility].  Call the office [phone]) or go to [facility] for these signs of pre-eclampsia: Severe headache that does not go away with Tylenol Visual changes- seeing spots, double, blurred vision Pain under your right breast or upper abdomen that does not go away with Tums or heartburn medicine Nausea and/or vomiting Severe swelling in your hands, feet, and face   [facility] Pediatricians/Family Doctors [facility]): [address] [place], [phone]           [facility]: [address], [phone]                 [facility]): [address], [phone] (call to ask if accepting patients) [facility] Department: [address], [place], [phone]    [facility] Pediatricians/Family Doctors [facility]): [address], [phone] [facility]: [address] [place], [phone] [facility]: [address], [phone]  [facility] Doctors  [facility]): [phone] [facility]: [address], [phone]   [facility] Doctors [facility]: [address] [phone]  [facility] Doctors  [facility]: [phone], [phone]  Home Blood Pressure Monitoring for Patients   Your provider has recommended that you check your blood pressure (BP) at least once a week at home. If you do not have a blood pressure cuff at home, one will be provided for you. Contact your provider if you have not received your monitor within 1 week.   Helpful Tips for Accurate Home Blood Pressure Checks  Don't smoke, exercise, or drink caffeine 30 minutes before checking your BP Use the restroom before checking your BP (a full bladder can raise your pressure) Relax in a comfortable upright chair Feet on the ground Left arm resting comfortably on a flat surface at the level of your heart Legs uncrossed Back supported Sit quietly and don't talk Place the cuff on your bare arm Adjust snuggly, so that only two fingertips  can fit between your skin and the top of the cuff Check 2 readings separated by at least one minute Keep a log of your BP readings For a visual, please reference this diagram: http://ccnc.care/bpdiagram  Provider Name: [facility]     Phone: [phone]  Zone 1: ALL CLEAR  Continue to monitor your symptoms:  BP reading is less than 140 (top number) or less than 90 (bottom number)  No right  upper stomach pain No headaches or seeing spots No feeling nauseated or throwing up No swelling in face and hands  Zone 2: CAUTION Call your doctor's office for any of the following:  BP reading is greater than 140 (top number) or greater than 90 (bottom number)  Stomach pain under your ribs in the middle or right side Headaches or seeing spots Feeling nauseated or throwing up Swelling in face and hands  Zone 3: EMERGENCY  Seek immediate medical care if you have any of the following:  BP reading is greater than160 (top number) or greater than 110 (bottom number) Severe headaches not improving with Tylenol Serious difficulty catching your breath Any worsening symptoms from Zone 2   Braxton Hicks Contractions Contractions of the uterus can occur throughout pregnancy, but they are not always a sign that you are in labor. You may have practice contractions called Braxton Hicks contractions. These false labor contractions are sometimes confused with true labor. What are Deberah Pelton contractions? Braxton Hicks contractions are tightening movements that occur in the muscles of the uterus before labor. Unlike true labor contractions, these contractions do not result in opening (dilation) and thinning of the cervix. Toward the end of pregnancy (32-34 weeks), Braxton Hicks contractions can happen more often and may become stronger. These contractions are sometimes difficult to tell apart from true labor because they can be very uncomfortable. You should not feel embarrassed if you go to the hospital with false labor. Sometimes, the only way to tell if you are in true labor is for your health care provider to look for changes in the cervix. The health care provider will do a physical exam and may monitor your contractions. If you are not in true labor, the exam should show that your cervix is not dilating and your water has not broken. If there are no other health problems associated with your  pregnancy, it is completely safe for you to be sent home with false labor. You may continue to have Braxton Hicks contractions until you go into true labor. How to tell the difference between true labor and false labor True labor Contractions last 30-70 seconds. Contractions become very regular. Discomfort is usually felt in the top of the uterus, and it spreads to the lower abdomen and low back. Contractions do not go away with walking. Contractions usually become more intense and increase in frequency. The cervix dilates and gets thinner. False labor Contractions are usually shorter and not as strong as true labor contractions. Contractions are usually irregular. Contractions are often felt in the front of the lower abdomen and in the groin. Contractions may go away when you walk around or change positions while lying down. Contractions get weaker and are shorter-lasting as time goes on. The cervix usually does not dilate or become thin. Follow these instructions at home:  Take over-the-counter and prescription medicines only as told by your health care provider. Keep up with your usual exercises and follow other instructions from your health care provider. Eat and drink lightly if you think  you are going into labor. If Braxton Hicks contractions are making you uncomfortable: Change your position from lying down or resting to walking, or change from walking to resting. Sit and rest in a tub of warm water. Drink enough fluid to keep your urine pale yellow. Dehydration may cause these contractions. Do slow and deep breathing several times an hour. Keep all follow-up prenatal visits as told by your health care provider. This is important. Contact a health care provider if: You have a fever. You have continuous pain in your abdomen. Get help right away if: Your contractions become stronger, more regular, and closer together. You have fluid leaking or gushing from your vagina. You pass  blood-tinged mucus (bloody show). You have bleeding from your vagina. You have low back pain that you never had before. You feel your baby's head pushing down and causing pelvic pressure. Your baby is not moving inside you as much as it used to. Summary Contractions that occur before labor are called Braxton Hicks contractions, false labor, or practice contractions. Braxton Hicks contractions are usually shorter, weaker, farther apart, and less regular than true labor contractions. True labor contractions usually become progressively stronger and regular, and they become more frequent. Manage discomfort from [facility] contractions by changing position, resting in a warm bath, drinking plenty of water, or practicing deep breathing. This information is not intended to replace advice given to you by your health care provider. Make sure you discuss any questions you have with your health care provider. Document Revised: 07/12/2017 Document Reviewed: 12/13/2016 Elsevier Patient Education  2020 [employer].

## 2024-04-15 ENCOUNTER — Telehealth (HOSPITAL_COMMUNITY): Payer: Self-pay | Admitting: *Deleted

## 2024-04-15 ENCOUNTER — Encounter (HOSPITAL_COMMUNITY): Payer: Self-pay

## 2024-04-15 NOTE — Patient Instructions (Signed)
 ROY SNUFFER  04/15/2024   Your procedure is scheduled on:  04/25/2024  Arrive at 0730 at [employer] [facility] on [employer] at [facility]  and [employer]. You are invited to use the FREE valet parking or use the Visitor's parking deck.  Pick up the phone at the desk and dial [phone].  Call this number if you have problems the morning of surgery: [phone]  Remember:   Do not eat food:(After Midnight) Desps de medianoche.  You may drink clear liquids until  ___0530__.  Clear liquids means a liquid you can see thru.  It can have color such as Cola or Kool aid.  Tea is OK and coffee as long as no milk or creamer of any kind.  Take these medicines the morning of surgery with A SIP OF WATER:  Do not take lovenox  on the day before or day of surgery   Do not wear jewelry, make-up or nail polish.  Do not wear lotions, powders, or perfumes. Do not wear deodorant.  Do not shave 48 hours prior to surgery.  Do not bring valuables to the hospital.  [facility] is not   responsible for any belongings or valuables brought to the hospital.  Contacts, dentures or bridgework may not be worn into surgery.  Leave suitcase in the car. After surgery it may be brought to your room.  For patients admitted to the hospital, checkout time is 11:00 AM the day of              discharge.      Please read over the following fact sheets that you were given:     Preparing for Surgery

## 2024-04-15 NOTE — Telephone Encounter (Signed)
 Preadmission screen

## 2024-04-20 ENCOUNTER — Encounter: Admitting: Women's Health

## 2024-04-20 ENCOUNTER — Encounter (HOSPITAL_COMMUNITY): Payer: Self-pay

## 2024-04-20 ENCOUNTER — Telehealth (HOSPITAL_COMMUNITY): Payer: Self-pay | Admitting: *Deleted

## 2024-04-20 NOTE — Telephone Encounter (Signed)
 Preadmission screen

## 2024-04-21 ENCOUNTER — Ambulatory Visit (INDEPENDENT_AMBULATORY_CARE_PROVIDER_SITE_OTHER): Admitting: Obstetrics & Gynecology

## 2024-04-21 ENCOUNTER — Encounter: Payer: Self-pay | Admitting: Obstetrics & Gynecology

## 2024-04-21 ENCOUNTER — Ambulatory Visit

## 2024-04-21 VITALS — BP 118/73 | HR 80 | Wt 207.6 lb

## 2024-04-21 DIAGNOSIS — O24410 Gestational diabetes mellitus in pregnancy, diet controlled: Secondary | ICD-10-CM

## 2024-04-21 DIAGNOSIS — Z98891 History of uterine scar from previous surgery: Secondary | ICD-10-CM | POA: Diagnosis not present

## 2024-04-21 DIAGNOSIS — Z3A38 38 weeks gestation of pregnancy: Secondary | ICD-10-CM

## 2024-04-21 DIAGNOSIS — Z86718 Personal history of other venous thrombosis and embolism: Secondary | ICD-10-CM

## 2024-04-21 DIAGNOSIS — O0993 Supervision of high risk pregnancy, unspecified, third trimester: Secondary | ICD-10-CM

## 2024-04-21 NOTE — Progress Notes (Signed)
 US  38+5 wks,cephalic,anterior placenta gr 3,FHR 148 bpm,AFI 15 cm,EFW 3303 g 43%

## 2024-04-21 NOTE — Progress Notes (Signed)
 HIGH-RISK PREGNANCY VISIT Patient name: Stephanie Johns MRN [id number]  Date of birth: [birth year] Chief Complaint:   Routine Prenatal Visit  History of Present Illness:   Stephanie Johns is a 28 y.o. G53P1011 female at [redacted]w[redacted]d with an Estimated Date of Delivery: 04/30/24 being seen today for ongoing management of a high-risk pregnancy complicated by:  - GDM A1 Well-controlled with diet - History of DVT Currently on Lovenox  - Prior C-section Scheduled for repeat on 9-13  Today she reports no complaints.   Contractions: Not present. Vag. Bleeding: None.  Movement: Present. denies leaking of fluid.      10/28/2023    3:52 PM 02/28/2023    3:31 PM 09/28/2021    9:35 AM 10/23/2019   11:07 AM 02/19/2018    3:02 PM  Depression screen PHQ 2/9  Decreased Interest 0 0 0 0 0  Down, Depressed, Hopeless 0 1 2 0 0  PHQ - 2 Score 0 1 2 0 0  Altered sleeping 2 2 2  1   Tired, decreased energy 0 1 0  0  Change in appetite 2 2 2   0  Feeling bad or failure about yourself  1 1 1   0  Trouble concentrating 2 3 1   0  Moving slowly or fidgety/restless 0 0 0  0  Suicidal thoughts 0 0 0  0  PHQ-9 Score 7 10 8  1      Current Outpatient Medications  Medication Instructions   Accu-Chek Softclix Lancets lancets Use as instructed to check blood sugar 4 times daily   Blood Glucose Monitoring Suppl (ACCU-CHEK GUIDE ME) w/Device KIT 1 each, Does not apply, 4 times daily   Blood Glucose Monitoring Suppl (ONETOUCH VERIO REFLECT) w/Device KIT Use to check blood glucose four times daily   enoxaparin  (LOVENOX ) 60 mg, Subcutaneous, Every 24 hours   glucose blood test strip Use as instructed to check blood sugar four times daily   glucose blood test strip Use as instructed to check blood sugar four times daily   Lancets (ONETOUCH ULTRASOFT) lancets Use as directed to check blood sugar 4 times daily   Prenatal Vit-Fe Fumarate-FA (PRENATAL VITAMIN PO) Take by mouth.     Review of Systems:   Pertinent items are  noted in HPI Denies abnormal vaginal discharge w/ itching/odor/irritation, headaches, visual changes, shortness of breath, chest pain, abdominal pain, severe nausea/vomiting, or problems with urination or bowel movements unless otherwise stated above. Pertinent History Reviewed:  Reviewed past medical,surgical, social, obstetrical and family history.  Reviewed problem list, medications and allergies. Physical Assessment:   Vitals:   04/21/24 0907  BP: 118/73  Pulse: 80  Weight: 207 lb 9.6 oz (94.2 kg)  Body mass index is 33.51 kg/m.           Physical Examination:   General appearance: alert, well appearing, and in no distress  Mental status: normal mood, behavior, speech, dress, motor activity, and thought processes  Skin: warm & dry   Extremities:   Minimal edema   Cardiovascular: normal heart rate noted  Respiratory: normal respiratory effort, no distress  Abdomen: gravid, soft, non-tender  Pelvic: Cervical exam deferred         Fetal Status:     Movement: Present    Fetal Surveillance Testing today: growth scan-  ,cephalic,anterior placenta gr 3,FHR 148 bpm,AFI 15 cm,EFW 3303 g 43%   Chaperone: N/A    No results found for this or any previous visit (from the past 24 hours).  Assessment & Plan:  High-risk pregnancy: G3P1011 at [redacted]w[redacted]d with an Estimated Date of Delivery: 04/30/24   1) GDMA1 -no change diet controlled -growth AGA today  2) Prior C-section - Repeat with salpingectomy scheduled - Reviewed stopping Lovenox  prior to surgery  3) h.o DVT on Lovenox  []  Plan to continue Lovenox  6 weeks postpartum  Meds: No orders of the defined types were placed in this encounter.   Labs/procedures today: Growth scan  Treatment Plan: Routine OB care and as outlined above  Reviewed: Term labor symptoms and general obstetric precautions including but not limited to vaginal bleeding, contractions, leaking of fluid and fetal movement were reviewed in detail with the patient.   All questions were answered.  Patient has home bp cuff. Check bp weekly, let us  know if >140/90.   Follow-up: Return for 1wk postop from 9/13.   Future Appointments  Date Time Provider Department Center  04/23/2024  9:00 AM [facility] PAT 1 [facility] None    No orders of the defined types were placed in this encounter.   Bernarr Longsworth, DO Attending Obstetrician & Gynecologist, [facility] for [employer], [facility]

## 2024-04-22 ENCOUNTER — Encounter (HOSPITAL_COMMUNITY): Payer: Self-pay | Admitting: Obstetrics and Gynecology

## 2024-04-22 ENCOUNTER — Inpatient Hospital Stay (HOSPITAL_COMMUNITY)
Admission: AD | Admit: 2024-04-22 | Discharge: 2024-04-22 | Disposition: A | Discharge status: Home / Self Care | Source: Home / Self Care | Attending: Obstetrics and Gynecology | Admitting: Obstetrics and Gynecology

## 2024-04-22 DIAGNOSIS — S93401A Sprain of unspecified ligament of right ankle, initial encounter: Secondary | ICD-10-CM

## 2024-04-22 DIAGNOSIS — X501XXA Overexertion from prolonged static or awkward postures, initial encounter: Secondary | ICD-10-CM | POA: Insufficient documentation

## 2024-04-22 DIAGNOSIS — Z3A38 38 weeks gestation of pregnancy: Secondary | ICD-10-CM | POA: Diagnosis not present

## 2024-04-22 DIAGNOSIS — M25571 Pain in right ankle and joints of right foot: Secondary | ICD-10-CM | POA: Diagnosis not present

## 2024-04-22 DIAGNOSIS — O99891 Other specified diseases and conditions complicating pregnancy: Secondary | ICD-10-CM | POA: Diagnosis not present

## 2024-04-22 DIAGNOSIS — O34211 Maternal care for low transverse scar from previous cesarean delivery: Secondary | ICD-10-CM | POA: Diagnosis not present

## 2024-04-22 DIAGNOSIS — O9A213 Injury, poisoning and certain other consequences of external causes complicating pregnancy, third trimester: Secondary | ICD-10-CM | POA: Insufficient documentation

## 2024-04-22 DIAGNOSIS — W010XXA Fall on same level from slipping, tripping and stumbling without subsequent striking against object, initial encounter: Secondary | ICD-10-CM | POA: Insufficient documentation

## 2024-04-22 NOTE — MAU Note (Signed)
..  Stephanie Johns is a 28 y.o. at [redacted]w[redacted]d here in MAU reporting: around 1830 tripped over ledge felt ankle pop fell on left knee and left hand. Denies abdominal pain. Denies vaginal bleeding or leaking of fluid. +FM.   Pain score: ankle 7/10;  Vitals:   04/22/24 2023  BP: 113/76  Pulse: 81  Resp: 18  Temp: 98.4 F (36.9 C)  SpO2: 100%     FHT: 155 Lab orders placed from triage: none

## 2024-04-22 NOTE — MAU Provider Note (Addendum)
 Stephanie Johns is a 28 y.o. female G3P1011 at [redacted]w[redacted]d who presents with fall.   History of Present Illness On 04/22/24 at 1845, patient endorses tripping over a raised mulch bed, rolling her R ankle and hearing a pop, and falling in slow motion, purposefully catching herself on her L knee and L hand. She called the midwife nursing line, who recommended she visit MAU to rule out placental abruption or other complications. The pain is located on her outer right ankle with occasional radiation to the right outer calf. She reports that at its worst, the pain was a 6-7/10, throbbing, and prevented walking without learning on her husband. Since then, it has transitioned to a dull ache, and she can tolerate walking on her tip toes. She denies having fallen on her stomach or head. ROS: +FMs. No LOF, VB, blurry vision, headaches, edema, or RUQ pain. Endorses pain and swelling, inability to bear weight immediately after injury.  Past Medical History:  Diagnosis Date   Anxiety    Dysrhythmia    Gestational diabetes    High cholesterol    History of DVT of lower extremity    Vitamin D  deficiency     Past Surgical History:  Procedure Laterality Date   CESAREAN SECTION N/A 09/13/2018   Procedure: CESAREAN SECTION;  Surgeon: Fredirick Glenys RAMAN, MD;  Location: [facility] BIRTHING SUIT[facility];  Service: Obstetrics;  Laterality: N/A;   NO PAST SURGERIES      Family History  Problem Relation Age of Onset   Rheum arthritis Mother    Other Mother        fatty liver   Hypertension Mother    Autism Son    Cancer Maternal Grandmother    Hyperlipidemia Maternal Grandmother    Hyperlipidemia Maternal Grandfather    Hypertension Maternal Grandfather    Heart attack Maternal Grandfather    Diabetes Maternal Grandfather    Pneumonia Maternal Grandfather     Social History   Tobacco Use   Smoking status: Never   Smokeless tobacco: Never  Vaping Use   Vaping status: Never Used  Substance Use Topics   Alcohol use:  Not Currently    Comment: rarely   Drug use: No    Allergies: No Known Allergies  Medications Prior to Admission  Medication Sig Dispense Refill Last Dose/Taking   Accu-Chek Softclix Lancets lancets Use as instructed to check blood sugar 4 times daily 100 each 12    Blood Glucose Monitoring Suppl (ACCU-CHEK GUIDE ME) w/Device KIT 1 each by Does not apply route 4 (four) times daily. 1 kit 0    Blood Glucose Monitoring Suppl (ONETOUCH VERIO REFLECT) w/Device KIT Use to check blood glucose four times daily 1 kit 0    docusate sodium  (COLACE) 100 MG capsule Take 100 mg by mouth daily as needed for moderate constipation.      enoxaparin  (LOVENOX ) 60 MG/0.6ML injection Inject 0.6 mLs (60 mg total) into the skin daily. 18 mL 6    glucose blood test strip Use as instructed to check blood sugar four times daily 100 each 12    glucose blood test strip Use as instructed to check blood sugar four times daily 100 each 12    Lancets (ONETOUCH ULTRASOFT) lancets Use as directed to check blood sugar 4 times daily 100 each 12    Prenatal Vit-Fe Fumarate-FA (PRENATAL VITAMIN PO) Take 1 tablet by mouth daily.      Review of Systems negative aside from what is included in HPI.  Physical Exam Blood pressure 113/76, pulse 81, temperature 98.4 F (36.9 C), temperature source Oral, resp. rate 18, height 5' 6 (1.676 m), weight 93.4 kg, last menstrual period 07/25/2023, SpO2 100%.  Constitutional: Gravid woman lying down with FHR monitor in place, not in acute distress. CV: Normal rate. Pulm: Effort normal on room air, no respiratory distress. Neuro: Alert and cooperative. Gait intact. Lower extremities: R lateral malleolus TTP, no tenderness in the proximal 6cm. Minor swelling, no ecchymosis. Bilateral feet and ankles normal ROM/sensation/proprioception. Strength 5/5 plantarflexion and dorsiflexion bilaterally. Dorsalis pedis 2+. Skin warm, dry, and intact. No deformity. Gait: antalgic but able to bear  weight.  Fetal Heart Rate: Baseline 150, moderate variability, +accels, -decels  MAU Course Prolonged fetal monitoring until 4h after fall was reassuring . Short period of fetal tachycardia was resolved with po fluid bolus.   Assessment and Plan Stephanie Johns is a 29 y.o. female G3P1011 at [redacted]w[redacted]d who presents with right ankle pain after a fall, during which she did not hit her abdomen. [place] Ankle rule cannot r/o fx d/t tenderness over lateral mal, however low suspicion based on physical exam and WB ability.   - Defer ankle x-ray per [place] Ankle Rules scoring - low suspicion for placental abruption, US  deferred - Conservative management of ankle following POLICE principles - Discussed safety of Tylenol  PRN for pain and avoidance of NSAIDs in pregnancy

## 2024-04-22 NOTE — Discharge Instructions (Addendum)
 Thank you for visiting the Maternal Assessment Unit (MAU) this evening. You came to the MAU after rolling your ankle out of an abundance of caution. Because your ankle pain was getting better over time and you were able to bear weight on it, we decided against taking an x-ray at this time. We monitored your baby with fetal heart rate monitoring for over an hour, and your baby's heart rate remained normal. After this, we discharged you home.  For your ankle pain, we recommend resting, weight-bearing as tolerated, applying ice, compressing your ankle with socks or ACE wrap, elevating your leg, and taking Tylenol  as needed for pain. Please do not use Advil  (ibuprofen ), Aleve (naproxen), or any medication belonging to the NSAID category as these are not safe in pregnancy.  When to return to the MAU Please return to the MAU (Maternity Assessment Unit) if you experience vaginal bleeding, leaking/gush of fluid like your water  broke, notice decreased movement from your baby after doing kick counts, or contractions the are becoming more intense or more frequent.

## 2024-04-23 ENCOUNTER — Ambulatory Visit: Payer: Self-pay | Admitting: Family Medicine

## 2024-04-23 ENCOUNTER — Encounter (HOSPITAL_COMMUNITY)
Admission: RE | Admit: 2024-04-23 | Discharge: 2024-04-23 | Disposition: A | Discharge status: Home / Self Care | Source: Ambulatory Visit | Attending: Obstetrics and Gynecology | Admitting: Obstetrics and Gynecology

## 2024-04-23 ENCOUNTER — Other Ambulatory Visit: Payer: Self-pay | Admitting: Family Medicine

## 2024-04-23 DIAGNOSIS — Z98891 History of uterine scar from previous surgery: Secondary | ICD-10-CM | POA: Insufficient documentation

## 2024-04-23 DIAGNOSIS — Z01812 Encounter for preprocedural laboratory examination: Secondary | ICD-10-CM | POA: Insufficient documentation

## 2024-04-23 HISTORY — DX: Gestational diabetes mellitus in pregnancy, unspecified control: O24.419

## 2024-04-23 HISTORY — DX: Cardiac arrhythmia, unspecified: I49.9

## 2024-04-23 LAB — TYPE AND SCREEN
ABO/RH(D): A POS
Antibody Screen: NEGATIVE

## 2024-04-23 LAB — CBC
HCT: 38.1 % (ref 36.0–46.0)
Hemoglobin: 12.5 g/dL (ref 12.0–15.0)
MCH: 30.9 pg (ref 26.0–34.0)
MCHC: 32.8 g/dL (ref 30.0–36.0)
MCV: 94.1 fL (ref 80.0–100.0)
Platelets: 233 K/uL (ref 150–400)
RBC: 4.05 MIL/uL (ref 3.87–5.11)
RDW: 14 % (ref 11.5–15.5)
WBC: 11.1 K/uL — ABNORMAL HIGH (ref 4.0–10.5)
nRBC: 0 % (ref 0.0–0.2)

## 2024-04-23 LAB — RAPID HIV SCREEN (HIV 1/2 AB+AG)
HIV 1/2 Antibodies: NONREACTIVE
HIV-1 P24 Antigen - HIV24: NONREACTIVE

## 2024-04-24 LAB — RPR: RPR Ser Ql: NONREACTIVE

## 2024-04-25 ENCOUNTER — Other Ambulatory Visit: Payer: Self-pay

## 2024-04-25 ENCOUNTER — Inpatient Hospital Stay (HOSPITAL_COMMUNITY): Admitting: Anesthesiology

## 2024-04-25 ENCOUNTER — Encounter (HOSPITAL_COMMUNITY): Payer: Self-pay | Admitting: Obstetrics and Gynecology

## 2024-04-25 ENCOUNTER — Inpatient Hospital Stay (HOSPITAL_COMMUNITY)
Admission: RE | Admit: 2024-04-25 | Discharge: 2024-04-27 | DRG: 785 | Disposition: A | Discharge status: Home / Self Care | Attending: Obstetrics and Gynecology | Admitting: Obstetrics and Gynecology

## 2024-04-25 ENCOUNTER — Encounter (HOSPITAL_COMMUNITY)
Admission: RE | Disposition: A | Discharge status: Home / Self Care | Payer: Self-pay | Source: Home / Self Care | Attending: Obstetrics and Gynecology

## 2024-04-25 DIAGNOSIS — Z302 Encounter for sterilization: Secondary | ICD-10-CM | POA: Diagnosis not present

## 2024-04-25 DIAGNOSIS — Z3A39 39 weeks gestation of pregnancy: Secondary | ICD-10-CM

## 2024-04-25 DIAGNOSIS — O24420 Gestational diabetes mellitus in childbirth, diet controlled: Secondary | ICD-10-CM | POA: Diagnosis present

## 2024-04-25 DIAGNOSIS — Z86718 Personal history of other venous thrombosis and embolism: Secondary | ICD-10-CM

## 2024-04-25 DIAGNOSIS — O34211 Maternal care for low transverse scar from previous cesarean delivery: Principal | ICD-10-CM | POA: Diagnosis present

## 2024-04-25 DIAGNOSIS — Z98891 History of uterine scar from previous surgery: Principal | ICD-10-CM

## 2024-04-25 DIAGNOSIS — O24424 Gestational diabetes mellitus in childbirth, insulin controlled: Secondary | ICD-10-CM | POA: Diagnosis not present

## 2024-04-25 LAB — CBC
HCT: 33.2 % — ABNORMAL LOW (ref 36.0–46.0)
Hemoglobin: 11.6 g/dL — ABNORMAL LOW (ref 12.0–15.0)
MCH: 31.7 pg (ref 26.0–34.0)
MCHC: 34.9 g/dL (ref 30.0–36.0)
MCV: 90.7 fL (ref 80.0–100.0)
Platelets: 199 K/uL (ref 150–400)
RBC: 3.66 MIL/uL — ABNORMAL LOW (ref 3.87–5.11)
RDW: 13.8 % (ref 11.5–15.5)
WBC: 16.5 K/uL — ABNORMAL HIGH (ref 4.0–10.5)
nRBC: 0 % (ref 0.0–0.2)

## 2024-04-25 LAB — CREATININE, SERUM
Creatinine, Ser: 0.51 mg/dL (ref 0.44–1.00)
GFR, Estimated: >60 mL/min (ref >60)

## 2024-04-25 LAB — GLUCOSE, CAPILLARY
Glucose-Capillary: 80 mg/dL (ref 70–99)
Glucose-Capillary: 77 mg/dL (ref 70–99)

## 2024-04-25 SURGERY — Surgical Case
Anesthesia: Spinal

## 2024-04-25 MED ORDER — TRANEXAMIC ACID-NACL 1000-0.7 MG/100ML-% IV SOLN
1000.0000 mg | Freq: Once | INTRAVENOUS | Status: AC
  Administered 2024-04-25: 1000 mg via INTRAVENOUS

## 2024-04-25 MED ORDER — SOD CITRATE-CITRIC ACID 500-334 MG/5ML PO SOLN
30.0000 mL | ORAL | Status: AC
  Administered 2024-04-25: 30 mL via ORAL

## 2024-04-25 MED ORDER — CEFAZOLIN SODIUM-DEXTROSE 2-4 GM/100ML-% IV SOLN
INTRAVENOUS | Status: AC
  Filled 2024-04-25: qty 100

## 2024-04-25 MED ORDER — ACETAMINOPHEN 500 MG PO TABS
1000.0000 mg | ORAL_TABLET | Freq: Four times a day (QID) | ORAL | Status: AC
  Administered 2024-04-25 – 2024-04-26 (×4): 1000 mg via ORAL
  Filled 2024-04-25 (×4): qty 2

## 2024-04-25 MED ORDER — KETOROLAC TROMETHAMINE 30 MG/ML IJ SOLN
30.0000 mg | Freq: Four times a day (QID) | INTRAMUSCULAR | Status: AC
  Administered 2024-04-25 – 2024-04-26 (×4): 30 mg via INTRAVENOUS
  Filled 2024-04-25 (×4): qty 1

## 2024-04-25 MED ORDER — MENTHOL 3 MG MT LOZG: 1.0000 | LOZENGE | OROMUCOSAL | Status: DC | PRN

## 2024-04-25 MED ORDER — KETOROLAC TROMETHAMINE 30 MG/ML IJ SOLN
30.0000 mg | Freq: Four times a day (QID) | INTRAMUSCULAR | Status: DC
  Administered 2024-04-25: 30 mg via INTRAVENOUS

## 2024-04-25 MED ORDER — DROPERIDOL 2.5 MG/ML IJ SOLN: 0.6250 mg | Freq: Once | INTRAMUSCULAR | Status: DC | PRN

## 2024-04-25 MED ORDER — GABAPENTIN 300 MG PO CAPS
300.0000 mg | ORAL_CAPSULE | Freq: Three times a day (TID) | ORAL | Status: DC
  Administered 2024-04-25 – 2024-04-27 (×6): 300 mg via ORAL
  Filled 2024-04-25 (×6): qty 1

## 2024-04-25 MED ORDER — OXYTOCIN-SODIUM CHLORIDE 30-0.9 UT/500ML-% IV SOLN
INTRAVENOUS | Status: DC | PRN
  Administered 2024-04-25: 300 mL via INTRAVENOUS

## 2024-04-25 MED ORDER — WITCH HAZEL-GLYCERIN EX PADS: 1.0000 | MEDICATED_PAD | CUTANEOUS | Status: DC | PRN

## 2024-04-25 MED ORDER — ONDANSETRON HCL 4 MG/2ML IJ SOLN: 4.0000 mg | Freq: Three times a day (TID) | INTRAMUSCULAR | Status: DC | PRN

## 2024-04-25 MED ORDER — ENOXAPARIN SODIUM 40 MG/0.4ML IJ SOSY: 40.0000 mg | PREFILLED_SYRINGE | INTRAMUSCULAR | Status: DC

## 2024-04-25 MED ORDER — PRENATAL MULTIVITAMIN CH
1.0000 | ORAL_TABLET | Freq: Every day | ORAL | Status: DC
  Administered 2024-04-25 – 2024-04-27 (×3): 1 via ORAL
  Filled 2024-04-25 (×3): qty 1

## 2024-04-25 MED ORDER — OXYTOCIN-SODIUM CHLORIDE 30-0.9 UT/500ML-% IV SOLN
2.5000 [IU]/h | INTRAVENOUS | Status: AC
  Administered 2024-04-25: 2.5 [IU]/h via INTRAVENOUS
  Filled 2024-04-25: qty 500

## 2024-04-25 MED ORDER — OXYCODONE HCL 5 MG/5ML PO SOLN: 5.0000 mg | Freq: Once | ORAL | Status: DC | PRN

## 2024-04-25 MED ORDER — ONDANSETRON HCL 4 MG/2ML IJ SOLN
INTRAMUSCULAR | Status: AC
  Filled 2024-04-25: qty 2

## 2024-04-25 MED ORDER — FENTANYL CITRATE (PF) 100 MCG/2ML IJ SOLN
INTRAMUSCULAR | Status: DC | PRN
  Administered 2024-04-25: 15 ug via INTRATHECAL

## 2024-04-25 MED ORDER — DEXMEDETOMIDINE HCL IN NACL 80 MCG/20ML IV SOLN
INTRAVENOUS | Status: DC | PRN
  Administered 2024-04-25: 8 ug via INTRAVENOUS

## 2024-04-25 MED ORDER — CEFAZOLIN SODIUM-DEXTROSE 2-4 GM/100ML-% IV SOLN
2.0000 g | INTRAVENOUS | Status: AC
  Administered 2024-04-25: 2 g via INTRAVENOUS

## 2024-04-25 MED ORDER — MORPHINE SULFATE (PF) 0.5 MG/ML IJ SOLN
INTRAMUSCULAR | Status: DC | PRN
  Administered 2024-04-25: 150 ug via INTRATHECAL

## 2024-04-25 MED ORDER — SODIUM CHLORIDE 0.9 % IR SOLN
Status: DC | PRN
Start: 2024-04-25 — End: 2024-04-25
  Administered 2024-04-25: 1

## 2024-04-25 MED ORDER — PHENYLEPHRINE HCL-NACL 20-0.9 MG/250ML-% IV SOLN
INTRAVENOUS | Status: DC | PRN
  Administered 2024-04-25: 60 ug/min via INTRAVENOUS

## 2024-04-25 MED ORDER — ACETAMINOPHEN 10 MG/ML IV SOLN
INTRAVENOUS | Status: DC | PRN
  Administered 2024-04-25: 1000 mg via INTRAVENOUS

## 2024-04-25 MED ORDER — NALOXONE HCL 4 MG/10ML IJ SOLN: 1.0000 ug/kg/h | INTRAVENOUS | Status: DC | PRN

## 2024-04-25 MED ORDER — LACTATED RINGERS IV SOLN: INTRAVENOUS | Status: DC

## 2024-04-25 MED ORDER — TRANEXAMIC ACID-NACL 1000-0.7 MG/100ML-% IV SOLN
INTRAVENOUS | Status: AC
  Filled 2024-04-25: qty 100

## 2024-04-25 MED ORDER — COCONUT OIL OIL: 1.0000 | TOPICAL_OIL | Status: DC | PRN

## 2024-04-25 MED ORDER — SODIUM CHLORIDE 0.9% FLUSH: 3.0000 mL | INTRAVENOUS | Status: DC | PRN

## 2024-04-25 MED ORDER — DIPHENHYDRAMINE HCL 25 MG PO CAPS: 25.0000 mg | ORAL_CAPSULE | Freq: Four times a day (QID) | ORAL | Status: DC | PRN

## 2024-04-25 MED ORDER — MORPHINE SULFATE (PF) 0.5 MG/ML IJ SOLN
INTRAMUSCULAR | Status: AC
  Filled 2024-04-25: qty 10

## 2024-04-25 MED ORDER — SIMETHICONE 80 MG PO CHEW: 80.0000 mg | CHEWABLE_TABLET | ORAL | Status: DC | PRN

## 2024-04-25 MED ORDER — NALOXONE HCL 0.4 MG/ML IJ SOLN: 0.4000 mg | INTRAMUSCULAR | Status: DC | PRN

## 2024-04-25 MED ORDER — SOD CITRATE-CITRIC ACID 500-334 MG/5ML PO SOLN
ORAL | Status: AC
  Filled 2024-04-25: qty 30

## 2024-04-25 MED ORDER — KETOROLAC TROMETHAMINE 30 MG/ML IJ SOLN: 30.0000 mg | Freq: Four times a day (QID) | INTRAMUSCULAR | Status: DC

## 2024-04-25 MED ORDER — DIPHENHYDRAMINE HCL 25 MG PO CAPS: 25.0000 mg | ORAL_CAPSULE | ORAL | Status: DC | PRN

## 2024-04-25 MED ORDER — OXYCODONE HCL 5 MG PO TABS: 5.0000 mg | ORAL_TABLET | Freq: Once | ORAL | Status: DC | PRN

## 2024-04-25 MED ORDER — ONDANSETRON HCL 4 MG/2ML IJ SOLN
INTRAMUSCULAR | Status: DC | PRN
  Administered 2024-04-25: 4 mg via INTRAVENOUS

## 2024-04-25 MED ORDER — BUPIVACAINE IN DEXTROSE 0.75-8.25 % IT SOLN
INTRATHECAL | Status: DC | PRN
  Administered 2024-04-25: 1.6 mL via INTRATHECAL

## 2024-04-25 MED ORDER — FENTANYL CITRATE (PF) 100 MCG/2ML IJ SOLN: 25.0000 ug | INTRAMUSCULAR | Status: DC | PRN

## 2024-04-25 MED ORDER — ENOXAPARIN SODIUM 60 MG/0.6ML IJ SOSY
60.0000 mg | PREFILLED_SYRINGE | INTRAMUSCULAR | Status: DC
  Administered 2024-04-26 – 2024-04-27 (×2): 60 mg via SUBCUTANEOUS
  Filled 2024-04-25 (×2): qty 0.6

## 2024-04-25 MED ORDER — DIPHENHYDRAMINE HCL 50 MG/ML IJ SOLN: 12.5000 mg | INTRAMUSCULAR | Status: DC | PRN

## 2024-04-25 MED ORDER — SENNOSIDES-DOCUSATE SODIUM 8.6-50 MG PO TABS
2.0000 | ORAL_TABLET | Freq: Every day | ORAL | Status: DC
  Administered 2024-04-26: 2 via ORAL
  Filled 2024-04-25: qty 2

## 2024-04-25 MED ORDER — IBUPROFEN 600 MG PO TABS
600.0000 mg | ORAL_TABLET | Freq: Four times a day (QID) | ORAL | Status: DC
  Administered 2024-04-26 – 2024-04-27 (×4): 600 mg via ORAL
  Filled 2024-04-25 (×4): qty 1

## 2024-04-25 MED ORDER — STERILE WATER FOR IRRIGATION IR SOLN
Status: DC | PRN
  Administered 2024-04-25: 1

## 2024-04-25 MED ORDER — SIMETHICONE 80 MG PO CHEW
80.0000 mg | CHEWABLE_TABLET | Freq: Three times a day (TID) | ORAL | Status: DC
  Administered 2024-04-25 – 2024-04-27 (×5): 80 mg via ORAL
  Filled 2024-04-25 (×5): qty 1

## 2024-04-25 MED ORDER — DIBUCAINE (PERIANAL) 1 % EX OINT: 1.0000 | TOPICAL_OINTMENT | CUTANEOUS | Status: DC | PRN

## 2024-04-25 MED ORDER — ZOLPIDEM TARTRATE 5 MG PO TABS: 5.0000 mg | ORAL_TABLET | Freq: Every evening | ORAL | Status: DC | PRN

## 2024-04-25 MED ORDER — FENTANYL CITRATE (PF) 100 MCG/2ML IJ SOLN
INTRAMUSCULAR | Status: AC
  Filled 2024-04-25: qty 2

## 2024-04-25 SURGICAL SUPPLY — 33 items
BENZOIN TINCTURE PRP APPL 2/3 (GAUZE/BANDAGES/DRESSINGS) ×1 IMPLANT
CHLORAPREP W/TINT 26ML (MISCELLANEOUS) ×2 IMPLANT
CLAMP UMBILICAL CORD (MISCELLANEOUS) ×1 IMPLANT
CLOTH BEACON ORANGE TIMEOUT ST (SAFETY) ×1 IMPLANT
DRESSING PREVENA PLUS CUSTOM (GAUZE/BANDAGES/DRESSINGS) IMPLANT
DRSG OPSITE POSTOP 4X10 (GAUZE/BANDAGES/DRESSINGS) ×1 IMPLANT
ELECTRODE REM PT RTRN 9FT ADLT (ELECTROSURGICAL) ×1 IMPLANT
EXTRACTOR VACUUM BELL STYLE (SUCTIONS) IMPLANT
GLOVE BIOGEL PI IND STRL 6.5 (GLOVE) ×1 IMPLANT
GLOVE BIOGEL PI IND STRL 7.0 (GLOVE) ×2 IMPLANT
GLOVE ECLIPSE 6.5 STRL STRAW (GLOVE) ×1 IMPLANT
GOWN STRL REUS W/TWL LRG LVL3 (GOWN DISPOSABLE) ×3 IMPLANT
KIT ABG SYR 3ML LUER SLIP (SYRINGE) IMPLANT
NDL HYPO 25X1 1.5 SAFETY (NEEDLE) IMPLANT
NEEDLE HYPO 22GX1.5 SAFETY (NEEDLE) ×1 IMPLANT
NEEDLE HYPO 25X1 1.5 SAFETY (NEEDLE) IMPLANT
NS IRRIG 1000ML POUR BTL (IV SOLUTION) ×1 IMPLANT
PACK C SECTION WH (CUSTOM PROCEDURE TRAY) ×1 IMPLANT
PAD OB MATERNITY 4.3X12.25 (PERSONAL CARE ITEMS) ×1 IMPLANT
RTRCTR C-SECT PINK 25CM LRG (MISCELLANEOUS) ×1 IMPLANT
STRIP CLOSURE SKIN 1/2X4 (GAUZE/BANDAGES/DRESSINGS) IMPLANT
SUT MON AB 4-0 PS1 27 (SUTURE) ×1 IMPLANT
SUT PDS AB 0 CTX 60 (SUTURE) IMPLANT
SUT PLAIN 0 NONE (SUTURE) IMPLANT
SUT PLAIN ABS 2-0 CT1 27XMFL (SUTURE) ×1 IMPLANT
SUT VIC AB 0 CT1 36 (SUTURE) ×2 IMPLANT
SUT VIC AB 0 CTX36XBRD ANBCTRL (SUTURE) IMPLANT
SUT VIC AB 2-0 CT1 TAPERPNT 27 (SUTURE) ×1 IMPLANT
SUT VIC AB 2-0 SH 27XBRD (SUTURE) IMPLANT
SYR CONTROL 10ML LL (SYRINGE) ×1 IMPLANT
TOWEL OR 17X24 6PK STRL BLUE (TOWEL DISPOSABLE) ×1 IMPLANT
TRAY FOLEY W/BAG SLVR 14FR LF (SET/KITS/TRAYS/PACK) ×1 IMPLANT
WATER STERILE IRR 1000ML POUR (IV SOLUTION) ×1 IMPLANT

## 2024-04-25 NOTE — Anesthesia Procedure Notes (Signed)
 Spinal  Patient location during procedure: OR Start time: 04/25/2024 9:43 AM End time: 04/25/2024 9:46 AM Reason for block: surgical anesthesia Staffing Performed: anesthesiologist  Anesthesiologist: Boone Fess, MD Performed by: Boone Fess, MD Authorized by: Boone Fess, MD   Preanesthetic Checklist Completed: patient identified, IV checked, site marked, risks and benefits discussed, surgical consent, monitors and equipment checked, pre-op evaluation and timeout performed Spinal Block Patient position: sitting Prep: ChloraPrep and site prepped and draped Patient monitoring: heart rate, continuous pulse ox, blood pressure and cardiac monitor Approach: midline Location: L3-4 Injection technique: single-shot Needle Needle type: Whitacre and Introducer  Needle gauge: 24 G Needle length: 9 cm Assessment Sensory level: T10 Events: paresthesia and CSF return Additional Notes Transient left leg paresthesia; needle pulled back and redirected with resolution. Meticulous sterile technique used throughout (CHG prep, sterile gloves, sterile drape). Negative blood return. Positive free-flowing CSF. Expiration date of kit checked and confirmed. Patient tolerated procedure well, without complications.

## 2024-04-25 NOTE — Transfer of Care (Signed)
 Immediate Anesthesia Transfer of Care Note  Patient: Stephanie Johns  Procedure(s) Performed: CESAREAN SECTION, WITH BILATERAL TUBAL LIGATION  Patient Location: PACU  Anesthesia Type:Spinal  Level of Consciousness: awake, alert , and oriented  Airway & Oxygen Therapy: Patient Spontanous Breathing  Post-op Assessment: Report given to RN and Post -op Vital signs reviewed and stable  Post vital signs: Reviewed and stable  Last Vitals:  Vitals Value Taken Time  BP 108/73 04/25/24 11:30  Temp    Pulse 68 04/25/24 11:33  Resp 14 04/25/24 11:33  SpO2 99 % 04/25/24 11:33  Vitals shown include unfiled device data.  Last Pain:  Vitals:   04/25/24 0751  TempSrc: Oral  PainSc: 0-No pain         Complications: No notable events documented.

## 2024-04-25 NOTE — Discharge Summary (Signed)
 Postpartum Discharge Summary       Patient Name: Stephanie Johns DOB: [birth year] MRN: [id number]  Date of admission: 04/25/2024 Delivery date:04/25/2024 Delivering provider: NICHOLAUS BURNARD HERO Date of discharge: 04/25/2024  Admitting diagnosis: Maternal care due to low transverse uterine scar from previous cesarean delivery [O34.211] Encounter for sterilization [Z30.2] S/P repeat low transverse C-section [S01.108] Intrauterine pregnancy: [redacted]w[redacted]d     Secondary diagnosis:  Principal Problem:   S/P repeat low transverse C-section  Additional problems: GDM    Discharge diagnosis: Term Pregnancy Delivered, s/p bilateral salpingectomy                                              Post partum procedures: None Augmentation: N/A Complications: None  Hospital course: Sceduled C/S   28 y.o. yo [id number] at [redacted]w[redacted]d was admitted to the hospital 04/25/2024 for scheduled cesarean section with the following indication:Elective Repeat.Delivery details are as follows:  Membrane Rupture Time/Date: 10:18 AM,04/25/2024  Delivery Method:C-Section, Low Transverse Operative Delivery:N/A Details of operation can be found in separate operative note.  Patient had a postpartum course that was uncomplicated.  She is ambulating, tolerating a regular diet, passing flatus, and urinating well. Patient is discharged home in stable condition on  04/25/24        Newborn Data: Birth date:04/25/2024 Birth time:10:18 AM Gender:Female Living status:Living Apgars:9 ,9  Weight:3290 g    Magnesium Sulfate received: No BMZ received: No Rhophylac:N/A MMR:N/A T-DaP:Given prenatally Flu: No RSV Vaccine received: No Transfusion:No  Immunizations received: Immunization History  Administered Date(s) Administered   Influenza, Mdck, Trivalent,PF 6+ MOS(egg free) 06/14/2023   Tdap 06/13/2018, 02/07/2024    Physical exam  Vitals:   04/25/24 0751  BP: 114/84  Pulse: 80  Resp: 17  Temp: 98.3 F (36.8 C)  TempSrc: Oral   SpO2: 100%  Weight: 93 kg  Height: 5' 7 (1.702 m)   General: alert, cooperative, and no distress Lochia: appropriate Uterine Fundus: firm Incision: Covered with wound vac DVT Evaluation: No evidence of DVT seen on physical exam. Negative Homan's sign. No cords or calf tenderness. Labs: Lab Results  Component Value Date   WBC 11.1 (H) 04/23/2024   HGB 12.5 04/23/2024   HCT 38.1 04/23/2024   MCV 94.1 04/23/2024   PLT 233 04/23/2024      Latest Ref Rng & Units 04/10/2024    8:10 AM  CMP  Glucose 70 - 99 mg/dL 95   BUN 6 - 20 mg/dL 11   Creatinine 9.42 - 1.00 mg/dL 9.34   Sodium 865 - 855 mmol/L 138   Potassium 3.5 - 5.2 mmol/L 4.5   Chloride 96 - 106 mmol/L 106   CO2 20 - 29 mmol/L 17   Calcium  8.7 - 10.2 mg/dL 9.2   Total Protein 6.0 - 8.5 g/dL 6.2   Total Bilirubin 0.0 - 1.2 mg/dL <9.7   Alkaline Phos 44 - 121 IU/L 126   AST 0 - 40 IU/L 16   ALT 0 - 32 IU/L 16    Edinburgh Score:    10/21/2018   11:20 AM  Edinburgh Postnatal Depression Scale Screening Tool  I have been able to laugh and see the funny side of things. 0  I have looked forward with enjoyment to things. 0  I have blamed myself unnecessarily when things went wrong. 0  I have been anxious or worried for  no good reason. 1  I have felt scared or panicky for no good reason. 1  Things have been getting on top of me. 0  I have been so unhappy that I have had difficulty sleeping. 0  I have felt sad or miserable. 0  I have been so unhappy that I have been crying. 0  The thought of harming myself has occurred to me. 0  Edinburgh Postnatal Depression Scale Total 2      Data saved with a previous flowsheet row definition   No data recorded  After visit meds:  Allergies as of 04/27/2024   No Known Allergies      Medication List     STOP taking these medications    Accu-Chek Guide Me w/Device Kit   Accu-Chek Softclix Lancets lancets   glucose blood test strip   onetouch ultrasoft lancets    OneTouch Verio Reflect w/Device Kit       TAKE these medications    acetaminophen  500 MG tablet Commonly known as: TYLENOL  Take 2 tablets (1,000 mg total) by mouth every 6 (six) hours as needed for mild pain (pain score 1-3) or moderate pain (pain score 4-6).   docusate sodium  100 MG capsule Commonly known as: COLACE Take 100 mg by mouth daily as needed for moderate constipation.   enoxaparin  60 MG/0.6ML injection Commonly known as: LOVENOX  Inject 0.6 mLs (60 mg total) into the skin daily.   ibuprofen  600 MG tablet Commonly known as: ADVIL  Take 1 tablet (600 mg total) by mouth every 6 (six) hours.   polyethylene glycol 17 g packet Commonly known as: MiraLax  Take 17 g by mouth daily.   PRENATAL VITAMIN PO Take 1 tablet by mouth daily.         Discharge home in stable condition Infant Feeding: Bottle and Breast Infant Disposition:home with mother Discharge instruction: per After Visit Summary and Postpartum booklet. Activity: Advance as tolerated. Pelvic rest for 6 weeks.  Diet: routine diet Future Appointments:No future appointments. Follow up Visit: Message sent to FT on 04/25/24    04/27/2024 Charlie DELENA Courts, MD

## 2024-04-25 NOTE — Op Note (Addendum)
 Stephanie Johns PROCEDURE DATE: 04/25/2024  PREOPERATIVE DIAGNOSES: Intrauterine pregnancy at [redacted]w[redacted]d weeks gestation; patient declines vag del attempt, GDM, undesired fertility  POSTOPERATIVE DIAGNOSES: The same  PROCEDURE: Repeat Low Transverse Cesarean Section, bilateral salpingectomy  SURGEON:  LOIS Yolanda Moats, MD  ASSISTANT:  Leeroy Pouch, MD  An experienced assistant was required given the standard of surgical care given the complexity of the case.  This assistant was needed for exposure, dissection, suctioning, retraction, instrument exchange, assisting with delivery with administration of fundal pressure, and for overall help during the procedure.  ANESTHESIOLOGY TEAM: Anesthesiologist: Boone Fess, MD CRNA: Ellison Elida RAMAN, CRNA  INDICATIONS: Stephanie Johns is a 28 y.o. [phone] at [redacted]w[redacted]d here for cesarean section secondary to the indications listed under preoperative diagnoses; please see preoperative note for further details.  The risks of cesarean section were discussed with the patient including but were not limited to: bleeding which may require transfusion or reoperation; infection which may require antibiotics; injury to bowel, bladder, ureters or other surrounding organs; injury to the fetus; need for additional procedures including hysterectomy in the event of a life-threatening hemorrhage; placental abnormalities wth subsequent pregnancies, incisional problems, thromboembolic phenomenon and other postoperative/anesthesia complications.  She consents to blood transfusion in the event of an emergency. The patient verbalized understanding of the plan, giving informed written consent for the procedure.    FINDINGS:  Viable female infant in cephalic presentation.  Apgars 9 and 9. Weight: 3290g. Clear amniotic fluid.  Intact placenta, three vessel cord.  Normal uterus, fallopian tubes and ovaries bilaterally.  ANESTHESIA: Spinal INTRAVENOUS FLUIDS: 1600 ml   ESTIMATED BLOOD  LOSS: 259 ml URINE OUTPUT:  700 ml SPECIMENS: Placenta sent to L&D and fallopian tubes sent to pathology  COMPLICATIONS: None immediate  PROCEDURE IN DETAIL:  The patient preoperatively received intravenous antibiotics and had sequential compression devices applied to her lower extremities.  She was then taken to the operating room where spinal anesthesia was administered to surgical level and was found to be adequate. She was then placed in a dorsal supine position with a leftward tilt. She was prepped and draped in a sterile manner.  A foley catheter was placed into her bladder with sterile technique and attached to constant gravity.  After a timeout was performed, a Pfannenstiel skin incision was made with scalpel over her preexisting scar and carried through to the underlying layer of fascia. The fascia was incised in the midline, and this incision was extended bilaterally using the Mayo scissors. Kocher clamps were applied to the superior aspect of the fascial incision and the underlying rectus muscles were dissected off bluntly and sharply. The rectus muscles were separated in the midline bluntly and the peritoneum was entered bluntly. The peritoneal incision was carefully extended sharply laterally and caudad with good visualization of the bladder. The uterus appeared normal. The Alexis O-ring retractor was placed into the incision, taking care not to incorporate bowel or omentum. The bladder blade was inserted. Attention was turned to the lower uterine segment where a low transverse hysterotomy was made with a scalpel and extended bilaterally bluntly.  The infant was delivered from vertex straight OA position, nose and mouth were bulb suctioned, and the cord clamped and cut after 1 minute. The infant was then handed over to the waiting nursery team. Uterine massage was then performed, and the placenta delivered spontaneously intact with a three-vessel cord. The uterus was then gently exteriorized and  cleared of clots and debris.  The hysterotomy was closed  with 0 Vicryl in a running locked fashion. Hemostasis noted.  Attention was turned to the fallopian tubes. The right fallopian tube was grasped with babcocks and followed out to the fimbriated end. The distal 5 cm portion of the tube and mesosalpinz was doubly clamped using Lavinia Mcneely clamps and the distal portion of the tube removed using Metzenbaum scissors. The remaining pedicle was suture ligated using a 2-0 Vicryl free tie and a subsequent suture ligation with 2-0 Vicryl. The pedicle was inspected and found to be hemostatic. The same procedure was completed with the left tube. The excised portions of tubes were sent to pathology.   Attention returned to the hysterotomy. Figure-of-eight 0 Vicryl serosal stitches were placed to help with hemostasis. The fallopian tubes and ovaries were visualized bilaterally and normal appearing. The uterus was then gently replaced within the abdomen. The Alexis retractor was removed.  The pelvis was cleared of all clot and debris. Hemostasis was again confirmed on all surfaces. The peritoneum was re-approximated using 2-0 Vicryl suture. The fascia was then closed using 0 PDS in a running fashion.  The subcutaneous layer was irrigated, then reapproximated with 2-0 plain gut. The skin was closed with a 4-0 Monocryl subcuticular stitch. The patient tolerated the procedure well. Sponge, lap, instrument and needle counts were correct x 3.  She was taken to the recovery room in stable condition.    Leeroy KATHEE Pouch, MD  FM/OB Fellow Faculty Practice Center for [facility], [facility]    Attestation of Attending Supervision of OB Fellow: Evaluation, management, and procedures were performed by the [facility] Fellow under my supervision and collaboration. I was scrubbed and present for all portions of this procedure. I agree with the documentation and plan.  LOIS Yolanda Moats, MD, [facility] Attending Center  for [employer] (Faculty Practice)  04/25/2024 2:27 PM

## 2024-04-25 NOTE — Anesthesia Preprocedure Evaluation (Signed)
 Anesthesia Evaluation  Patient identified by MRN, date of birth, ID band Patient awake    Reviewed: Allergy & Precautions, NPO status , Patient's Chart, lab work & pertinent test results  History of Anesthesia Complications Negative for: history of anesthetic complications  Airway Mallampati: II  TM Distance: >3 FB Neck ROM: Full    Dental no notable dental hx. (+) Teeth Intact   Pulmonary neg pulmonary ROS, neg sleep apnea, neg COPD, Patient abstained from smoking.Not current smoker   Pulmonary exam normal breath sounds clear to auscultation       Cardiovascular Exercise Tolerance: Good METS(-) hypertension+ DVT  (-) CAD and (-) Past MI (-) dysrhythmias  Rhythm:Regular Rate:Normal - Systolic murmurs    Neuro/Psych  PSYCHIATRIC DISORDERS Anxiety     negative neurological ROS     GI/Hepatic ,neg GERD  ,,(+)     (-) substance abuse    Endo/Other  diabetes, Gestational    Renal/GU negative Renal ROS     Musculoskeletal   Abdominal   Peds  Hematology  (+) Blood dyscrasia Unspecified clotting disorder, patient says hematology workup did not yield any named diagnoses. She has been on lovenox  this pregnancy and last dose was two days ago.    Anesthesia Other Findings Recent labs reviewed. Past Medical History: No date: Anxiety No date: Dysrhythmia No date: Gestational diabetes No date: High cholesterol No date: History of DVT of lower extremity No date: Vitamin D  deficiency   Reproductive/Obstetrics (+) Pregnancy                              Anesthesia Physical Anesthesia Plan  ASA: 2  Anesthesia Plan: Spinal   Post-op Pain Management: Ofirmev  IV (intra-op)* and Toradol  IV (intra-op)*   Induction:   PONV Risk Score and Plan: 4 or greater and Ondansetron  and Dexamethasone   Airway Management Planned: Natural Airway  Additional Equipment:   Intra-op Plan:   Post-operative  Plan:   Informed Consent: I have reviewed the patients History and Physical, chart, labs and discussed the procedure including the risks, benefits and alternatives for the proposed anesthesia with the patient or authorized representative who has indicated his/her understanding and acceptance.       Plan Discussed with: CRNA and Surgeon  Anesthesia Plan Comments: (Lovenox  dose held appropriately, >24hrs per ASRA guidelines.  Discussed R/B/A of neuraxial anesthesia technique with patient: - rare risks of spinal/epidural hematoma, nerve damage, infection - Risk of PDPH - Risk of itching - Risk of nausea and vomiting - Risk of conversion to general anesthesia and its associated risks, including sore throat, damage to lips/teeth/oropharynx, and rare risks such as cardiac and respiratory events. - Risk of surgical bleeding requiring blood products - Risk of allergic reactions Discussed the role of CRNA in patient's perioperative care.  Patient voiced understanding.)        Anesthesia Quick Evaluation

## 2024-04-25 NOTE — Anesthesia Postprocedure Evaluation (Signed)
 Anesthesia Post Note  Patient: Stephanie Johns  Procedure(s) Performed: CESAREAN SECTION, WITH BILATERAL TUBAL LIGATION     Patient location during evaluation: L&D Anesthesia Type: Spinal Level of consciousness: oriented and awake and alert Pain management: pain level controlled Vital Signs Assessment: post-procedure vital signs reviewed and stable Respiratory status: spontaneous breathing, respiratory function stable and patient connected to nasal cannula oxygen Cardiovascular status: blood pressure returned to baseline and stable Postop Assessment: no headache, no backache, no apparent nausea or vomiting and spinal receding Anesthetic complications: no   No notable events documented.  Last Vitals:  Vitals:   04/25/24 1200 04/25/24 1215  BP: (!) 107/59 109/77  Pulse: 67 (!) 58  Resp: 19 (!) 22  Temp:    SpO2: 100% 99%    Last Pain:  Vitals:   04/25/24 1215  TempSrc:   PainSc: 0-No pain   Pain Goal:                   Rome Ade

## 2024-04-25 NOTE — Lactation Note (Signed)
 This note was copied from a baby's chart. Lactation Consultation Note  Patient Name: Stephanie Johns Unijb'd Date: 04/25/2024 Age:28 hours Reason for consult: Initial assessment  MOB declines LC services. MOB may still call for assistance as needed.  Feeding Mother's Current Feeding Choice: Breast Milk and Formula Nipple Type: Slow - flow  Consult Status Consult Status: Complete (mother declined follow up)    Recardo Hoit BS, IBCLC 04/25/2024, 3:05 PM

## 2024-04-25 NOTE — H&P (Signed)
 Obstetric Preoperative History and Physical  Stephanie Johns is a 28 y.o. G3P1011 with IUP at [redacted]w[redacted]d presenting for scheduled repeat cesarean section and bilateral tubal ligation.  No acute concerns.   Prenatal Course Source of Care: [facility] with onset of care at 13 weeks Pregnancy complications or risks: Patient Active Problem List   Diagnosis Date Noted   S/P repeat low transverse C-section 04/25/2024   GDM (gestational diabetes mellitus) 02/12/2024   Palpitations 11/20/2023   High cholesterol    History of cesarean delivery 10/14/2023   Encounter for supervision of high risk pregnancy in third trimester, antepartum 10/14/2023   History of DVT (deep vein thrombosis) 09/14/2016   She plans to breastfeed She desires bilateral tubal ligation for postpartum contraception.   Prenatal labs and studies: ABO, Rh: --/--/A POS (09/11 0920) Antibody: NEG (09/11 0920) Rubella: 3.38 (03/17 1634) RPR: NON REACTIVE (09/11 1024)  HBsAg: Negative (03/17 1634)  HIV: NON REACTIVE (09/11 1024)  HAD:Wzhjupcz/-- (08/25 1521) 2 hr Glucola  elevated Genetic screening normal Anatomy US  normal  Prenatal Transfer Tool  Fetal Ultrasounds or other Referrals:  None Maternal Substance Abuse:  No Significant Maternal Medications:  Meds include: Other: lovenox  Significant Maternal Lab Results: None  Past Medical History:  Diagnosis Date   Anxiety    Dysrhythmia    Gestational diabetes    High cholesterol    History of DVT of lower extremity    Vitamin D  deficiency     Past Surgical History:  Procedure Laterality Date   CESAREAN SECTION N/A 09/13/2018   Procedure: CESAREAN SECTION;  Surgeon: Fredirick Glenys RAMAN, MD;  Location: [facility]IRTH[facility] SUIT[facility];  Service: Obstetrics;  Laterality: N/A;   NO PAST SURGERIES      OB History  Gravida Para Term Preterm AB Living  3 1 1  1 1   SAB IAB Ectopic Multiple Live Births  1    1    # Outcome Date GA Lbr Len/2nd Weight Sex Type Anes PTL Lv  3 Current            2 Term 09/13/18 [redacted]w[redacted]d  3600 g M CS-LTranv Spinal  LIV     Birth Comments: ROM/breech  1 SAB             Social History   Socioeconomic History   Marital status: Married    Spouse name: Not on file   Number of children: 1   Years of education: Not on file   Highest education level: Not on file  Occupational History   Not on file  Tobacco Use   Smoking status: Never   Smokeless tobacco: Never  Vaping Use   Vaping status: Never Used  Substance and Sexual Activity   Alcohol use: Not Currently    Comment: rarely   Drug use: No   Sexual activity: Yes    Birth control/protection: None  Other Topics Concern   Not on file  Social History Narrative   Not on file   Social Drivers of Health   Financial Resource Strain: Low Risk  (02/28/2023)   Overall Financial Resource Strain (CARDIA)    Difficulty of Paying Living Expenses: Not hard at all  Food Insecurity: No Food Insecurity (04/25/2024)   Hunger Vital Sign    Worried About Running Out of Food in the Last Year: Never true    Ran Out of Food in the Last Year: Never true  Transportation Needs: No Transportation Needs (04/25/2024)   PRAPARE - Transportation  Lack of Transportation (Medical): No    Lack of Transportation (Non-Medical): No  Physical Activity: Insufficiently Active (02/28/2023)   Exercise Vital Sign    Days of Exercise per Week: 4 days    Minutes of Exercise per Session: 30 min  Stress: Stress Concern Present (02/28/2023)   [employer] of Occupational Health - Occupational Stress Questionnaire    Feeling of Stress : Very much  Social Connections: Moderately Integrated (02/28/2023)   Social Connection and Isolation Panel    Frequency of Communication with Friends and Family: More than three times a week    Frequency of Social Gatherings with Friends and Family: Three times a week    Attends Religious Services: 1 to 4 times per year    Active Member of Clubs or Organizations: No    Attends Museum/gallery exhibitions officer: Never    Marital Status: Married    Family History  Problem Relation Age of Onset   Rheum arthritis Mother    Other Mother        fatty liver   Hypertension Mother    Autism Son    Cancer Maternal Grandmother    Hyperlipidemia Maternal Grandmother    Hyperlipidemia Maternal Grandfather    Hypertension Maternal Grandfather    Heart attack Maternal Grandfather    Diabetes Maternal Grandfather    Pneumonia Maternal Grandfather     Medications Prior to Admission  Medication Sig Dispense Refill Last Dose/Taking   Accu-Chek Softclix Lancets lancets Use as instructed to check blood sugar 4 times daily 100 each 12 04/25/2024 Morning   Blood Glucose Monitoring Suppl (ACCU-CHEK GUIDE ME) w/Device KIT 1 each by Does not apply route 4 (four) times daily. 1 kit 0 04/25/2024 Morning   Blood Glucose Monitoring Suppl (ONETOUCH VERIO REFLECT) w/Device KIT Use to check blood glucose four times daily 1 kit 0 04/25/2024 Morning   docusate sodium  (COLACE) 100 MG capsule Take 100 mg by mouth daily as needed for moderate constipation.   Past Week   enoxaparin  (LOVENOX ) 60 MG/0.6ML injection Inject 0.6 mLs (60 mg total) into the skin daily. 18 mL 6 Past Week   glucose blood test strip Use as instructed to check blood sugar four times daily 100 each 12 04/25/2024 Morning   glucose blood test strip Use as instructed to check blood sugar four times daily 100 each 12 04/25/2024 Morning   Lancets (ONETOUCH ULTRASOFT) lancets Use as directed to check blood sugar 4 times daily 100 each 12 04/25/2024 Morning   Prenatal Vit-Fe Fumarate-FA (PRENATAL VITAMIN PO) Take 1 tablet by mouth daily.   Past Week    No Known Allergies  Review of Systems: Negative except for what is mentioned in HPI.  Physical Exam: BP 114/84   Pulse 80   Temp 98.3 F (36.8 C) (Oral)   Resp 17   Ht 5' 7 (1.702 m)   Wt 93 kg   LMP 07/25/2023   SpO2 100%   BMI 32.12 kg/m  FHR by Doppler: 150 bpm CONSTITUTIONAL:  Well-developed, well-nourished female in no acute distress.  HENT:  Normocephalic, atraumatic, External right and left ear normal. Oropharynx is clear and moist EYES: Conjunctivae and EOM are normal. Pupils are equal, round, and reactive to light. No scleral icterus.  NECK: Normal range of motion, supple, no masses SKIN: Skin is warm and dry. No rash noted. Not diaphoretic. No erythema. No pallor. NEUROLGIC: Alert and oriented to person, place, and time. Normal reflexes, muscle tone coordination. No cranial nerve  deficit noted. PSYCHIATRIC: Normal mood and affect. Normal behavior. Normal judgment and thought content. CARDIOVASCULAR: Normal heart rate noted, regular rhythm RESPIRATORY: Effort and breath sounds normal, no problems with respiration noted ABDOMEN: Soft, nontender, nondistended, gravid. Well-healed Pfannenstiel incision. PELVIC: Deferred MUSCULOSKELETAL: Normal range of motion. No edema and no tenderness.    Pertinent Labs/Studies:   Results for orders placed or performed during the hospital encounter of 04/25/24 (from the past 72 hours)  Glucose, capillary     Status: None   Collection Time: 04/25/24  8:40 AM  Result Value Ref Range   Glucose-Capillary 80 70 - 99 mg/dL    Comment: Glucose reference range applies only to samples taken after fasting for at least 8 hours.    Assessment and Plan :Stephanie Johns is a 42 y.o. G3P1011 at [redacted]w[redacted]d being admitted for scheduled repeat cesarean section and bilateral tubal ligation. No acute issues today  *h/o DVT unprovoked on lovenox , last dose Wednesday night ~ 60 hours ago *gDMA1, CBGs well controlled *BMI 32  The risks of cesarean section were discussed with the patient; including but not limited to: infection which may require antibiotics; bleeding which may require transfusion or re-operation; injury to bowel, bladder, ureters or other surrounding organs; injury to the fetus; need for additional procedures including hysterectomy  in the event of a life-threatening hemorrhage; placental abnormalities wth subsequent pregnancies,  risk of needing c-sections in future pregnancies, incisional problems, thromboembolic phenomenon and other postoperative/anesthesia complications.   Reviewed risks of bilateral tubal ligation including infection, hemorrhage, damage to surrounding tissue and organs, risk of regret. Reviewed bilateral tubal ligation failure rate of ~1%, and that there is a slightly higher risk of ectopic after tubal ligation; if she has any reason to believe she is pregnant, she should take a pregnancy test. She understands this is an elective procedure and again affirms her desire. Answered all questions. The patient verbalized understanding of the plan, giving informed consent for the procedure. She is agreeable to blood transfusion in the event of emergency.   Patient has been NPO since midnight, she will remain NPO for procedure Anesthesia and OR aware Preoperative prophylactic antibiotics and SCDs ordered on call to the OR  To OR when ready   K. Yolanda Moats, M.D. Attending Obstetrician & Gynecologist, [facility] [employer], [facility]

## 2024-04-26 LAB — CBC
HCT: 30.7 % — ABNORMAL LOW (ref 36.0–46.0)
Hemoglobin: 10.5 g/dL — ABNORMAL LOW (ref 12.0–15.0)
MCH: 31.4 pg (ref 26.0–34.0)
MCHC: 34.2 g/dL (ref 30.0–36.0)
MCV: 91.9 fL (ref 80.0–100.0)
Platelets: 160 K/uL (ref 150–400)
RBC: 3.34 MIL/uL — ABNORMAL LOW (ref 3.87–5.11)
RDW: 14 % (ref 11.5–15.5)
WBC: 10 K/uL (ref 4.0–10.5)
nRBC: 0 % (ref 0.0–0.2)

## 2024-04-26 LAB — GLUCOSE, RANDOM: Glucose, Bld: 88 mg/dL (ref 70–99)

## 2024-04-26 MED ORDER — ACETAMINOPHEN 500 MG PO TABS
1000.0000 mg | ORAL_TABLET | Freq: Four times a day (QID) | ORAL | Status: DC | PRN
  Administered 2024-04-26 – 2024-04-27 (×3): 1000 mg via ORAL
  Filled 2024-04-26 (×3): qty 2

## 2024-04-26 NOTE — Progress Notes (Signed)
 CSW received consult for hx of Anxiety. CSW met with MOB to offer support and complete assessment. When CSW entered the room, MOB was observed sitting in hospital bed changing infant Ivy. MOB's mother and FOB were present. CSW introduced self and offered privacy. MOB provided verbal consent for CSW to continue with family members present. CSW explained reason for visit. MOB presented as calm, was agreeable to CSW visit, and remained engaged during encounter.   CSW inquired about MOB's mental health history. MOB acknowledged a history of anxiety, stating I have always had really bad anxiety. CSW inquired about symptoms during pregnancy. MOB states she has been managing symptoms on her own without medication or therapy but feels that she tends to worry about more things than (she) should. MOB reports symptoms feel manageable and shares she feels comfortable contacting her OBGYN if her symptoms worsen. MOB recalls experiencing the baby blues after the birth of her first child and felt she needed to be around others to minimize feelings of worry but denied endorsing postpartum depression/anxiety. MOB states she was prescribed mental health medication in the past years ago but did not like the way it made her feel. CSW inquired about supports. MOB identified FOB, her mother, sister, and sister-in-law as supportive. MOB denied current SI/HI. Domestic violence was not assessed due to FOB being present.  CSW provided education regarding the baby blues period vs. perinatal mood disorders, discussed treatment and gave resources for mental health follow up if concerns arise.  CSW recommends self-evaluation during the postpartum time period using the New Mom Checklist from Postpartum Progress and encouraged MOB to contact a medical professional if symptoms are noted at any time.    MOB reports she has all needed items for infant, including a car seat and bassinet. MOB has chosen [facility] for infant's  follow up care.  CSW provided review of Sudden Infant Death Syndrome (SIDS) precautions.    CSW identifies no further need for intervention and no barriers to discharge at this time.  Signed,  Sharyne LOIS Roulette, MSW, LCSWA, LCASA November 16, 2023 12:42 PM

## 2024-04-26 NOTE — Progress Notes (Signed)
 POSTPARTUM PROGRESS NOTE  Post Operative Day 1  Subjective:  Stephanie Johns is a 28 y.o. H6E7987 s/p elective rLTCS with BLTL (salpingectomy) at [redacted]w[redacted]d.  She reports she is doing well. No acute events overnight. She denies any problems with ambulating, voiding or po intake. Denies nausea or vomiting.  Pain is well controlled.  Lochia is Normal.  Objective: Blood pressure 107/70, pulse 84, temperature 97.9 F (36.6 C), temperature source Oral, resp. rate 18, height 5' 7 (1.702 m), weight 93 kg, last menstrual period 07/25/2023, SpO2 98%, unknown if currently breastfeeding.  BP Readings from Last 3 Encounters:  04/26/24 107/70  04/22/24 122/79  04/21/24 118/73    Physical Exam:  General: alert, cooperative and no distress Chest: no respiratory distress Heart:regular rate, distal pulses intact Uterine Fundus: firm, appropriately tender DVT Evaluation: No calf swelling or tenderness Extremities: none edema Skin: warm, dry  Recent Labs    04/25/24 1414 04/26/24 0510  HGB 11.6* 10.5*  HCT 33.2* 30.7*    Assessment/Plan: Stephanie Johns is a 71 y.o. H6E7987 s/p RCS w BLTL at [redacted]w[redacted]d   POD# 1 - Doing well  Routine postpartum care  Delivery Complications: none and gestational diabetes class A1 Blood Pressure: normal Anemia/Hb Status: Stable, appropriate postpartum Hb, no intervention indicated Contraception: Tubal Ligation Feeding: breast feeding and bottle feeding  #GDMA1: nl fasting glucose, needs PP 2hr GTT  Dispo: Plan for discharge tomorrow.   LOS: 1 day   Leeroy KATHEE Pouch, MD OB Fellow  04/26/2024, 3:58 PM

## 2024-04-27 ENCOUNTER — Other Ambulatory Visit (HOSPITAL_COMMUNITY): Payer: Self-pay

## 2024-04-27 ENCOUNTER — Encounter (HOSPITAL_COMMUNITY): Payer: Self-pay | Admitting: Obstetrics and Gynecology

## 2024-04-27 MED ORDER — POLYETHYLENE GLYCOL 3350 17 GM/SCOOP PO POWD
17.0000 g | Freq: Every day | ORAL | 0 refills | Status: DC
  Filled 2024-04-27: qty 476, 28d supply, fill #0

## 2024-04-27 MED ORDER — ACETAMINOPHEN 500 MG PO TABS
1000.0000 mg | ORAL_TABLET | Freq: Four times a day (QID) | ORAL | 0 refills | Status: DC | PRN
  Filled 2024-04-27: qty 30, 4d supply, fill #0

## 2024-04-27 MED ORDER — IBUPROFEN 600 MG PO TABS
600.0000 mg | ORAL_TABLET | Freq: Four times a day (QID) | ORAL | 0 refills | Status: DC
  Filled 2024-04-27: qty 30, 8d supply, fill #0

## 2024-04-28 ENCOUNTER — Telehealth: Payer: Self-pay

## 2024-04-28 NOTE — Telephone Encounter (Signed)
 Pt thinks that something is wrong with her wound vac, it is making a sucking noise and she can see water  in the lines. Pt wants to know what she needs to do.

## 2024-04-28 NOTE — Telephone Encounter (Signed)
 Pt states that she took a shower with her back to the water . After her shower she noticed water  in the lines and it had a sucking sound. She's concerned that she got water  in the incision and it will get infected. States that there is no alarm going off. Advised I would send this message to Dr. Jayne and let her know if he wanted her to do anything.

## 2024-04-28 NOTE — Telephone Encounter (Signed)
 Called patient back and informed her of Dr. Regis recommendation.

## 2024-04-29 ENCOUNTER — Encounter: Payer: Self-pay | Admitting: Women's Health

## 2024-04-29 LAB — SURGICAL PATHOLOGY

## 2024-04-30 ENCOUNTER — Ambulatory Visit: Payer: Self-pay | Admitting: Obstetrics and Gynecology

## 2024-05-01 ENCOUNTER — Inpatient Hospital Stay (EMERGENCY_DEPARTMENT_HOSPITAL)
Admission: AD | Admit: 2024-05-01 | Discharge: 2024-05-01 | Disposition: A | Discharge status: Home / Self Care | Source: Home / Self Care | Attending: Obstetrics & Gynecology | Admitting: Obstetrics & Gynecology

## 2024-05-01 ENCOUNTER — Other Ambulatory Visit: Payer: Self-pay

## 2024-05-01 ENCOUNTER — Telehealth: Payer: Self-pay | Admitting: *Deleted

## 2024-05-01 ENCOUNTER — Encounter: Payer: Self-pay | Admitting: *Deleted

## 2024-05-01 ENCOUNTER — Encounter (HOSPITAL_BASED_OUTPATIENT_CLINIC_OR_DEPARTMENT_OTHER): Payer: Self-pay

## 2024-05-01 DIAGNOSIS — O9081 Anemia of the puerperium: Secondary | ICD-10-CM | POA: Diagnosis not present

## 2024-05-01 DIAGNOSIS — O8620 Urinary tract infection following delivery, unspecified: Secondary | ICD-10-CM | POA: Diagnosis not present

## 2024-05-01 DIAGNOSIS — R7401 Elevation of levels of liver transaminase levels: Secondary | ICD-10-CM | POA: Insufficient documentation

## 2024-05-01 DIAGNOSIS — R0602 Shortness of breath: Secondary | ICD-10-CM | POA: Diagnosis not present

## 2024-05-01 DIAGNOSIS — N390 Urinary tract infection, site not specified: Secondary | ICD-10-CM | POA: Diagnosis not present

## 2024-05-01 DIAGNOSIS — Z7901 Long term (current) use of anticoagulants: Secondary | ICD-10-CM | POA: Diagnosis not present

## 2024-05-01 DIAGNOSIS — D649 Anemia, unspecified: Secondary | ICD-10-CM | POA: Diagnosis not present

## 2024-05-01 DIAGNOSIS — F419 Anxiety disorder, unspecified: Secondary | ICD-10-CM | POA: Diagnosis not present

## 2024-05-01 DIAGNOSIS — Z86718 Personal history of other venous thrombosis and embolism: Secondary | ICD-10-CM | POA: Insufficient documentation

## 2024-05-01 DIAGNOSIS — Z98891 History of uterine scar from previous surgery: Secondary | ICD-10-CM

## 2024-05-01 DIAGNOSIS — R001 Bradycardia, unspecified: Secondary | ICD-10-CM | POA: Insufficient documentation

## 2024-05-01 DIAGNOSIS — O9089 Other complications of the puerperium, not elsewhere classified: Secondary | ICD-10-CM | POA: Diagnosis present

## 2024-05-01 DIAGNOSIS — R0789 Other chest pain: Secondary | ICD-10-CM | POA: Insufficient documentation

## 2024-05-01 DIAGNOSIS — O906 Postpartum mood disturbance: Secondary | ICD-10-CM

## 2024-05-01 DIAGNOSIS — R079 Chest pain, unspecified: Secondary | ICD-10-CM

## 2024-05-01 LAB — BASIC METABOLIC PANEL WITH GFR
Anion gap: 14 (ref 5–15)
BUN: 13 mg/dL (ref 6–20)
CO2: 21 mmol/L — ABNORMAL LOW (ref 22–32)
Calcium: 9.6 mg/dL (ref 8.9–10.3)
Chloride: 106 mmol/L (ref 98–111)
Creatinine, Ser: 0.79 mg/dL (ref 0.44–1.00)
GFR, Estimated: >60 mL/min (ref >60)
Glucose, Bld: 101 mg/dL — ABNORMAL HIGH (ref 70–99)
Potassium: 4 mmol/L (ref 3.5–5.1)
Sodium: 140 mmol/L (ref 135–145)

## 2024-05-01 LAB — TROPONIN T, HIGH SENSITIVITY: Troponin T High Sensitivity: <15 ng/L (ref 0–19)

## 2024-05-01 LAB — CBC
HCT: 34.5 % — ABNORMAL LOW (ref 36.0–46.0)
Hemoglobin: 11.4 g/dL — ABNORMAL LOW (ref 12.0–15.0)
MCH: 30.9 pg (ref 26.0–34.0)
MCHC: 33 g/dL (ref 30.0–36.0)
MCV: 93.5 fL (ref 80.0–100.0)
Platelets: 257 K/uL (ref 150–400)
RBC: 3.69 MIL/uL — ABNORMAL LOW (ref 3.87–5.11)
RDW: 13.4 % (ref 11.5–15.5)
WBC: 7.5 K/uL (ref 4.0–10.5)
nRBC: 0 % (ref 0.0–0.2)

## 2024-05-01 MED ORDER — HYDROXYZINE HCL 10 MG PO TABS: 10.0000 mg | ORAL_TABLET | Freq: Three times a day (TID) | ORAL | 0 refills | Status: AC | PRN

## 2024-05-01 NOTE — MAU Provider Note (Addendum)
 Chief Complaint:  Chest Pain   HPI  Stephanie Johns is a 28 year old G42P2012 female postpartum day 6 who presents to the MAU for chest pain. Patient endorses having chest pain since yesterday (9/18) that comes and goes, but today has been more constant. She says that her pain starts in the middle of her chest and radiates across her chest, but denies radiation in the neck, shoulders, arms, or back. She describes the pain as a heaviness and tightness. She also reported a higher blood pressure reading of 148/86 last night and pulse rates in the 50s. Endorsed SOB last night when she laid back and also sat up, but denies any SOB this morning. Denies any lower extremity swelling, nausea, vomiting, acid reflux, headaches, vision changes, heavy vaginal bleeding, and has not had this chest pain before. Of note, during the interview patient was crying and endorsing a lot of PP blues. Reports being very anxious and crying all of the time. Endorses having PP blues with her past births, but none that were this severe. Patient endorsed sometimes feeling chest pain more when she is anxious. Denies SI or HI.  Pregnancy Course: Receives care at [facility]. Prenatal records reviewed.   Past Medical History:  Diagnosis Date   Anxiety    Dysrhythmia    Gestational diabetes    High cholesterol    History of DVT of lower extremity    Vitamin D  deficiency    OB History  Gravida Para Term Preterm AB Living  3 2 2  1 2   SAB IAB Ectopic Multiple Live Births  1   0 2    # Outcome Date GA Lbr Len/2nd Weight Sex Type Anes PTL Lv  3 Term 04/25/24 106w2d  3290 g F CS-LTranv Spinal  LIV  2 Term 09/13/18 [redacted]w[redacted]d  3600 g M CS-LTranv Spinal  LIV     Birth Comments: ROM/breech  1 SAB            Past Surgical History:  Procedure Laterality Date   CESAREAN SECTION N/A 09/13/2018   Procedure: CESAREAN SECTION;  Surgeon: Fredirick Glenys RAMAN, MD;  Location: [facility] BIRTH[facility] SUITES;  Service: Obstetrics;  Laterality: N/A;    CESAREAN SECTION WITH BILATERAL TUBAL LIGATION N/A 04/25/2024   Procedure: CESAREAN SECTION, WITH BILATERAL TUBAL LIGATION;  Surgeon: Nicholaus Burnard HERO, MD;  Location: [facility] LD ORS;  Service: Obstetrics;  Laterality: N/A;   NO PAST SURGERIES     Family History  Problem Relation Age of Onset   Rheum arthritis Mother    Other Mother        fatty liver   Hypertension Mother    Autism Son    Cancer Maternal Grandmother    Hyperlipidemia Maternal Grandmother    Hyperlipidemia Maternal Grandfather    Hypertension Maternal Grandfather    Heart attack Maternal Grandfather    Diabetes Maternal Grandfather    Pneumonia Maternal Grandfather    Social History   Tobacco Use   Smoking status: Never   Smokeless tobacco: Never  Vaping Use   Vaping status: Never Used  Substance Use Topics   Alcohol use: Not Currently    Comment: rarely   Drug use: No   No Known Allergies No medications prior to admission.    I have reviewed patient's Past Medical Hx, Surgical Hx, Family Hx, Social Hx, medications and allergies.   ROS  Pertinent items noted in HPI and remainder of comprehensive ROS otherwise negative.   PHYSICAL EXAM  Patient Vitals for the past 24 hrs:  BP Temp Temp src Pulse Resp SpO2 Height Weight  05/01/24 1217 126/82 -- -- 88 18 99 % -- --  05/01/24 1035 113/65 98.6 F (37 C) Oral 68 20 -- -- --  05/01/24 1029 -- -- -- -- -- -- 5' 7 (1.702 m) 87.2 kg     Constitutional: Well-developed, well-nourished female in no acute distress.  HEENT: atraumatic, normocephalic.  Cardiovascular: normal rate & rhythm, warm and well-perfused.  Respiratory: normal effort, no problems with respiration noted, lungs clear bilaterally  GI: Abd soft, non-tender, non-distended MSK: Chest slightly tender to palpation centrally, non-radiating. No lower extremity edema. No tenderness to palpation in bilateral calves. Skin: warm and dry. Acyanotic, no jaundice or pallor. Neurologic: Alert and oriented x 4.  No abnormal coordination. Psychiatric: Dysphoric, anxious, crying during interview       Labs: No results found for this or any previous visit (from the past 24 hours).  Imaging:  No results found.  EKG: EKG: normal EKG, normal sinus rhythm. I personally reviewed this EKG for intrepretation.  MDM & MAU COURSE  MDM: Moderate  MAU Course: Patient came in with chest pain. Ordered EKG and was normal.   Differential diagnosis considered for anxiety, pulmonary embolism, stable angina, ACS, heart failure exacerbation, costochondritis, pneumothorax, chest wall contusion, pericarditis, myocarditis, preeclampsia. Given she is currently on Lovenox  for unprovoked DVTs and normal EKG, suspicion is higher for anxiety causing her symptoms.   Orders Placed This Encounter  Procedures   ED EKG   Discharge patient   Meds ordered this encounter  Medications   hydrOXYzine  (ATARAX ) 10 MG tablet    Sig: Take 1 tablet (10 mg total) by mouth 3 (three) times daily as needed for anxiety.    Dispense:  30 tablet    Refill:  0    ASSESSMENT   1. Transitory postpartum mood disturbance   2. S/P cesarean section   3. Central chest pain     Stephanie Johns is a 28 year old G75P2012 female postpartum day 6 who presents to the MAU for chest pain. Physical exam normal, EKG normal. Chest pain most likely a result of anxiety from transitory PP mood disturbance. Other diagnoses to consider but are less likely are unstable angina, PE, PP CM, preeclampsia. These diagnoses are less likely due to normal EKG, no current SOB or LE swelling, normal vitals.   PLAN  -Hydroxyzine  10 mg 3 times daily PRN for anxiety  Discharge home in stable condition with return precautions for worsening chest pain and shortness of breath.      Allergies as of 05/01/2024   No Known Allergies      Medication List     TAKE these medications    Acetaminophen  Extra Strength 500 MG Tabs Take 2 tablets (1,000 mg total) by mouth  every 6 (six) hours as needed for mild pain (pain score 1-3) or moderate pain (pain score 4-6).   docusate sodium  100 MG capsule Commonly known as: COLACE Take 100 mg by mouth daily as needed for moderate constipation.   enoxaparin  60 MG/0.6ML injection Commonly known as: LOVENOX  Inject 0.6 mLs (60 mg total) into the skin daily.   hydrOXYzine  10 MG tablet Commonly known as: ATARAX  Take 1 tablet (10 mg total) by mouth 3 (three) times daily as needed for anxiety.   ibuprofen  600 MG tablet Commonly known as: ADVIL  Take 1 tablet (600 mg total) by mouth every 6 (six) hours.   polyethylene  glycol powder 17 GM/SCOOP powder Commonly known as: GLYCOLAX /MIRALAX  Dissolve 1 capful (17g) in 4-8 ounces of liquid and take by mouth daily.   PRENATAL VITAMIN PO Take 1 tablet by mouth daily.       Stephanie Johns, Medical Student   I was personally present and re-performed the exam and medical decision making and verified the service and findings are accurately documented in the student's note.  Charlie DELENA Courts, MD 05/01/2024 1:36 PM

## 2024-05-01 NOTE — Discharge Instructions (Signed)
 Please look at the attached information on mood changes after pregnancy, as well as blood clots. Please come back to the MAU if you note worsening chest pain, worsening shortness of breath, or other symptoms as mentioned in the handout. I do hope you feel better soon and congratulations again!

## 2024-05-01 NOTE — Telephone Encounter (Signed)
 Spoke with patient regarding her symptoms. She reports low heart rates in 40's-50's. BP was 140's/80's last night but normal today. She feels very sluggish and has chest heaviness. I advised that she go to MAU for evaluation. No other questions at this time.

## 2024-05-01 NOTE — MAU Note (Signed)
 Stephanie Johns is a 28 y.o. at Unknown here in MAU reporting: she has chest pain that began yesterday, states pain is located in the center of her chest.  States the pain is currently constant, although has been intermittent.  Repots pain feels like a heaviness and tightening.  Also reports pulse was 45 bpm via BP cuff and her palpation.    S/P Cesarean 04/25/2024  LMP: NA Onset of complaint: yesterday Pain score: 7 Vitals:   05/01/24 1035  BP: 113/65  Pulse: 68  Resp: 20  Temp: 98.6 F (37 C)     FHT: NA  Lab orders placed from triage: EKG

## 2024-05-01 NOTE — ED Triage Notes (Addendum)
 Csection 9/13. Chest pain staring this morning. Was seen in MAU, Normal EKG. Now pain is radiating down L arm. Also having facial pain. States HR was in the 30s at home. Denies any pregnancy/ postpartum complications. States she is very emotional. Was given hydroxyzine  for PPD but has not started it yet.

## 2024-05-02 ENCOUNTER — Emergency Department (HOSPITAL_BASED_OUTPATIENT_CLINIC_OR_DEPARTMENT_OTHER)
Admission: EM | Admit: 2024-05-02 | Discharge: 2024-05-02 | Disposition: A | Discharge status: Home / Self Care | Attending: Emergency Medicine | Admitting: Emergency Medicine

## 2024-05-02 ENCOUNTER — Emergency Department (HOSPITAL_BASED_OUTPATIENT_CLINIC_OR_DEPARTMENT_OTHER): Admitting: Radiology

## 2024-05-02 ENCOUNTER — Emergency Department (HOSPITAL_BASED_OUTPATIENT_CLINIC_OR_DEPARTMENT_OTHER)

## 2024-05-02 DIAGNOSIS — R079 Chest pain, unspecified: Secondary | ICD-10-CM

## 2024-05-02 DIAGNOSIS — N390 Urinary tract infection, site not specified: Secondary | ICD-10-CM

## 2024-05-02 DIAGNOSIS — R7401 Elevation of levels of liver transaminase levels: Secondary | ICD-10-CM

## 2024-05-02 DIAGNOSIS — R001 Bradycardia, unspecified: Secondary | ICD-10-CM

## 2024-05-02 LAB — URINALYSIS, ROUTINE W REFLEX MICROSCOPIC
Bilirubin Urine: NEGATIVE
Glucose, UA: NEGATIVE mg/dL
Ketones, ur: NEGATIVE mg/dL
Nitrite: NEGATIVE
Protein, ur: 30 mg/dL — AB
RBC / HPF: >50 RBC/hpf (ref 0–5)
Specific Gravity, Urine: 1.026 (ref 1.005–1.030)
WBC, UA: >50 WBC/hpf (ref 0–5)
pH: 6 (ref 5.0–8.0)

## 2024-05-02 LAB — PRO BRAIN NATRIURETIC PEPTIDE: Pro Brain Natriuretic Peptide: 180 pg/mL (ref <300.0)

## 2024-05-02 LAB — HEPATIC FUNCTION PANEL
ALT: 48 U/L — ABNORMAL HIGH (ref 0–44)
AST: 29 U/L (ref 15–41)
Albumin: 4.3 g/dL (ref 3.5–5.0)
Alkaline Phosphatase: 106 U/L (ref 38–126)
Bilirubin, Direct: 0.1 mg/dL (ref 0.0–0.2)
Indirect Bilirubin: 0.2 mg/dL — ABNORMAL LOW (ref 0.3–0.9)
Total Bilirubin: 0.3 mg/dL (ref 0.0–1.2)
Total Protein: 7.8 g/dL (ref 6.5–8.1)

## 2024-05-02 LAB — TROPONIN T, HIGH SENSITIVITY: Troponin T High Sensitivity: <15 ng/L (ref 0–19)

## 2024-05-02 LAB — PREGNANCY, URINE: Preg Test, Ur: POSITIVE — AB

## 2024-05-02 MED ORDER — NITROFURANTOIN MONOHYD MACRO 100 MG PO CAPS: 100.0000 mg | ORAL_CAPSULE | Freq: Two times a day (BID) | ORAL | 0 refills | Status: DC

## 2024-05-02 MED ORDER — NITROFURANTOIN MONOHYD MACRO 100 MG PO CAPS
100.0000 mg | ORAL_CAPSULE | Freq: Once | ORAL | Status: AC
  Administered 2024-05-02: 100 mg via ORAL
  Filled 2024-05-02: qty 1

## 2024-05-02 MED ORDER — IOHEXOL 350 MG/ML SOLN
75.0000 mL | Freq: Once | INTRAVENOUS | Status: AC | PRN
  Administered 2024-05-02: 75 mL via INTRAVENOUS

## 2024-05-02 NOTE — ED Provider Notes (Signed)
 [place] EMERGENCY DEPARTMENT AT [facility] Provider Note   CSN: [id number] Arrival date & time: 05/01/24  2314     Patient presents with: Chest Pain   Stephanie Johns is a 28 y.o. female.   The history is provided by the patient.  Chest Pain  She has history of hyperlipidemia, gestational diabetes, DVT, and is 1 week postpartum and comes in because of chest pain and shortness of breath.  She describes a tight, squeezing feeling in her chest with some radiation to the neck and head and into her left arm.  Pain is constant, not affected by body position or exertion.  She has also noted that her heart rate has decreased into the 50s and 40s and seems to get slower if she lays back.  She gets lightheaded when she lays back.  She also notes dyspnea when laying flat.  She denies cough, nausea, vomiting.  She did not have problems with preeclampsia during her pregnancy.  She is not breast-feeding.  Episode of DVT was 5 years ago.    Prior to Admission medications   Medication Sig Start Date End Date Taking? Authorizing Provider  acetaminophen  (TYLENOL ) 500 MG tablet Take 2 tablets (1,000 mg total) by mouth every 6 (six) hours as needed for mild pain (pain score 1-3) or moderate pain (pain score 4-6). 04/27/24   Jomarie Charlie LABOR, MD  docusate sodium  (COLACE) 100 MG capsule Take 100 mg by mouth daily as needed for moderate constipation.    [provider]  enoxaparin  (LOVENOX ) 60 MG/0.6ML injection Inject 0.6 mLs (60 mg total) into the skin daily. 09/06/23   Signa Delon LABOR, NP  hydrOXYzine  (ATARAX ) 10 MG tablet Take 1 tablet (10 mg total) by mouth 3 (three) times daily as needed for anxiety. 05/01/24   Jomarie Charlie LABOR, MD  ibuprofen  (ADVIL ) 600 MG tablet Take 1 tablet (600 mg total) by mouth every 6 (six) hours. 04/27/24   Jomarie Charlie LABOR, MD  polyethylene glycol powder (GLYCOLAX /MIRALAX ) 17 GM/SCOOP powder Dissolve 1 capful (17g) in 4-8 ounces of liquid and take by  mouth daily. 04/27/24   Jomarie Charlie LABOR, MD  Prenatal Vit-Fe Fumarate-FA (PRENATAL VITAMIN PO) Take 1 tablet by mouth daily.    [provider]    Allergies: Patient has no known allergies.    Review of Systems  Cardiovascular:  Positive for chest pain.  All other systems reviewed and are negative.   Updated Vital Signs BP (!) 141/93   Pulse (!) 51   Temp 98 F (36.7 C)   Resp 18   SpO2 100%   Breastfeeding No   Physical Exam Vitals and nursing note reviewed.   28 year old female, resting comfortably and in no acute distress. Vital signs are significant for borderline elevated blood pressure and slow heart rate. Oxygen saturation is 100%, which is normal. Head is normocephalic and atraumatic. PERRLA, EOMI. Oropharynx is clear. Neck is nontender and supple without adenopathy or JVD. Back is nontender.  There is no presacral edema. Lungs are clear without rales, wheezes, or rhonchi. Chest is nontender. Heart is bradycardic without murmur. Abdomen is soft, flat, nontender. Extremities have 1+ edema, full range of motion is present. Skin is warm and dry without rash. Neurologic: Mental status is normal, cranial nerves are intact, moves all extremities equally.  (all labs ordered are listed, but only abnormal results are displayed) Labs Reviewed  BASIC METABOLIC PANEL WITH GFR - Abnormal; Notable for the following components:  Result Value   CO2 21 (*)    Glucose, Bld 101 (*)    All other components within normal limits  CBC - Abnormal; Notable for the following components:   RBC 3.69 (*)    Hemoglobin 11.4 (*)    HCT 34.5 (*)    All other components within normal limits  PREGNANCY, URINE - Abnormal; Notable for the following components:   Preg Test, Ur POSITIVE (*)    All other components within normal limits  TROPONIN T, HIGH SENSITIVITY  TROPONIN T, HIGH SENSITIVITY    EKG: EKG Interpretation Date/Time:  Friday May 01 2024 23:21:10  EDT Ventricular Rate:  49 PR Interval:  144 QRS Duration:  74 QT Interval:  434 QTC Calculation: 392 R Axis:   -36  Text Interpretation: Sinus bradycardia Left axis deviation Abnormal ECG Baseline wander When compared with ECG of 01-May-2024 10:39, No significant change was found Confirmed by Raford Lenis ([id number]) on 05/01/2024 11:28:11 PM  ED ECG REPORT   Date: 05/02/2024  Rate: 44  Rhythm: sinus bradycardia and junctional escape beats  QRS Axis: normal  Intervals: normal  ST/T Wave abnormalities: normal  Conduction Disutrbances:none  Narrative Interpretation: Sinus bradycardia with junctional escape beats, unchanged from prior  Old EKG Reviewed: unchanged  I have personally reviewed the EKG tracing and agree with the computerized printout as noted.  Radiology: DG Chest 2 View Result Date: 05/02/2024 EXAM: 2 VIEW(S) XRAY OF THE CHEST 05/02/2024 12:46:00 AM COMPARISON: None available. CLINICAL HISTORY: Chest pain. Csection 9/13. Chest pain staring this morning. Was seen in [facility], Normal EKG. Now pain is radiating down L arm. Also having facial pain. States HR was in the 30s at home. Denies any pregnancy/postpartum complications. States she is very emotional. Was given hydroxyzine  for PPD but has not started it yet. FINDINGS: LUNGS AND PLEURA: No focal pulmonary opacity. No pulmonary edema. No pleural effusion. No pneumothorax. HEART AND MEDIASTINUM: No acute abnormality of the cardiac and mediastinal silhouettes. BONES AND SOFT TISSUES: No acute osseous abnormality. IMPRESSION: 1. No acute process. Electronically signed by: Pinkie Pebbles MD 05/02/2024 12:59 AM EDT RP Workstation: [id number]     Procedures  Cardiac monitor shows sinus bradycardia with occasional dropped beats, per my interpretation Medications Ordered in the ED - No data to display                                  Medical Decision Making Amount and/or Complexity of Data Reviewed Labs: ordered. Radiology:  ordered.  Risk Prescription drug management.   Chest pain and shortness of breath in the postpartum period this is a presentation with wide range of treatment options and carries with it a high risk of morbidity and complications.  Differential diagnosis includes, but is not limited to, peripartum cardiomyopathy, pulmonary embolism, pneumonia, preeclampsia.  I have low index of suspicion for ACS.  I have reviewed her electrocardiogram, my interpretation is sinus bradycardia without ST or T changes.  Repeat ECG shows sinus bradycardia with some junctional escape beats, per my interpretation.  Chest x-ray shows no acute cardiopulmonary process.  Have independently viewed the images, and agree with the radiologist's interpretation.  I have reviewed her laboratory tests, my interpretation is anemia which is improving over baseline, normal platelet count, borderline elevated random glucose level and otherwise normal basic metabolic panel, normal troponin x 2.  Positive pregnancy test does not indicate she is pregnant, it can take several  weeks for hCG level to drop to levels that would not be picked up by routine pregnancy test.  To evaluate for possible preeclampsia, I have ordered urinalysis to look for proteinuria and hepatic function panel.  To evaluate for possible cardiomyopathy I have ordered a BNP level.  I have ordered CT angiogram to evaluate for possible pulmonary embolism.  I have reviewed her past records, and do note hospital admission for C-section on 04/25/2024, MA visit this morning at which time ECG was unremarkable.  Note to state that she is on enoxaparin  which would make pulmonary embolism unlikely.  CT angiogram shows no evidence of pulmonary embolism or pneumonia or other acute process.  Have independently viewed the images, and agree with the radiologist's interpretation.  Possible gallbladder sludge but this is not relevant to her current complaints.  I have reviewed her additional  laboratory tests, and my interpretation is normal BNP, borderline elevated ALT which is not felt to be clinically significant.  Her heart rate has continued to stay in the low 40s, but with normal blood pressure.  I had told the patient I would give her an ambulatory referral to cardiology but she states that she has a friend who is a cardiologist and she will follow-up with him.  I have reassured her regarding negative workup, but advised to return if she has new or concerning symptoms.     Final diagnoses:  Nonspecific chest pain  Urinary tract infection with hematuria, site unspecified  Bradycardia  Elevated ALT measurement    ED Discharge Orders     None          Raford Lenis, MD 05/02/24 [phone]

## 2024-05-02 NOTE — Discharge Instructions (Addendum)
 Your evaluation did not show any sign of blood clot, heart failure, or other serious problem.  Please follow-up with your cardiologist regarding your slow heart rate.  At any point, you are welcome to return for further evaluation.

## 2024-05-04 ENCOUNTER — Encounter: Payer: Self-pay | Admitting: Cardiology

## 2024-05-04 ENCOUNTER — Encounter: Admitting: Obstetrics and Gynecology

## 2024-05-04 ENCOUNTER — Ambulatory Visit (INDEPENDENT_AMBULATORY_CARE_PROVIDER_SITE_OTHER): Admitting: Cardiology

## 2024-05-04 VITALS — BP 108/70 | HR 66 | Ht 66.0 in | Wt 189.0 lb

## 2024-05-04 DIAGNOSIS — R0602 Shortness of breath: Secondary | ICD-10-CM | POA: Diagnosis not present

## 2024-05-04 DIAGNOSIS — R079 Chest pain, unspecified: Secondary | ICD-10-CM | POA: Insufficient documentation

## 2024-05-04 DIAGNOSIS — R001 Bradycardia, unspecified: Secondary | ICD-10-CM | POA: Insufficient documentation

## 2024-05-04 NOTE — Progress Notes (Signed)
 Cardiology Office Note   Date:  05/04/2024   ID:  Jamil, Castillo 1995/08/22, MRN [id number]  PCP:  Orlean Alan HERO, FNP  Cardiologist:  Jeoffrey Pollen, NP      History of Present Illness: Stephanie Johns is a 28 y.o. female who presents for  Chief Complaint  Patient presents with   Hospitalization Follow-up    Hospital Follow up/ Low HR/ Post Birth    Patient in office for hospital follow up. Patient went to ED twice on 05/01/24 with complaints of chest pain, shortness of breath, and low heart rate. Patient post c-section on 04/25/24. Patient on Lovenox  for history of DVT. CTA chest on 05/02/24 negative for PE. Troponin levels normal, BNP normal. CBC abnormal, slightly anemic post c-section.      Past Medical History:  Diagnosis Date   Anxiety    Dysrhythmia    Gestational diabetes    High cholesterol    History of DVT of lower extremity    Vitamin D  deficiency      Past Surgical History:  Procedure Laterality Date   CESAREAN SECTION N/A 09/13/2018   Procedure: CESAREAN SECTION;  Surgeon: Fredirick Glenys RAMAN, MD;  Location: [facility];  Service: Obstetrics;  Laterality: N/A;   CESAREAN SECTION WITH BILATERAL TUBAL LIGATION N/A 04/25/2024   Procedure: CESAREAN SECTION, WITH BILATERAL TUBAL LIGATION;  Surgeon: Nicholaus Burnard HERO, MD;  Location: [facility] LD ORS;  Service: Obstetrics;  Laterality: N/A;   NO PAST SURGERIES       Current Outpatient Medications  Medication Sig Dispense Refill   enoxaparin  (LOVENOX ) 60 MG/0.6ML injection Inject 0.6 mLs (60 mg total) into the skin daily. 18 mL 6   hydrOXYzine  (ATARAX ) 10 MG tablet Take 1 tablet (10 mg total) by mouth 3 (three) times daily as needed for anxiety. 30 tablet 0   No current facility-administered medications for this visit.    Allergies:   Patient has no known allergies.    Social History:   reports that she has never smoked. She has never used smokeless tobacco. She reports that she does not currently use  alcohol. She reports that she does not use drugs.   Family History:  family history includes Autism in her son; Cancer in her maternal grandmother; Diabetes in her maternal grandfather; Heart attack in her maternal grandfather; Hyperlipidemia in her maternal grandfather and maternal grandmother; Hypertension in her maternal grandfather and mother; Other in her mother; Pneumonia in her maternal grandfather; Rheum arthritis in her mother.    ROS:     Review of Systems  Constitutional: Negative.   HENT: Negative.    Eyes: Negative.   Respiratory:  Positive for shortness of breath.   Cardiovascular:  Positive for chest pain.  Gastrointestinal: Negative.   Genitourinary: Negative.   Musculoskeletal: Negative.   Skin: Negative.   Neurological: Negative.   Endo/Heme/Allergies: Negative.   Psychiatric/Behavioral: Negative.    All other systems reviewed and are negative.     All other systems are reviewed and negative.    PHYSICAL EXAM: VS:  BP 108/70   Pulse 66   Ht 5' 6 (1.676 m)   Wt 189 lb (85.7 kg)   SpO2 98%   BMI 30.51 kg/m  , BMI Body mass index is 30.51 kg/m. Last weight:  Wt Readings from Last 3 Encounters:  05/04/24 189 lb (85.7 kg)  05/01/24 192 lb 3.2 oz (87.2 kg)  04/25/24 205 lb 1.6 oz (93 kg)  Physical Exam Vitals and nursing note reviewed.  Constitutional:      Appearance: Normal appearance. She is normal weight.  HENT:     Head: Normocephalic and atraumatic.     Nose: Nose normal.     Mouth/Throat:     Mouth: Mucous membranes are moist.     Pharynx: Oropharynx is clear.  Eyes:     Conjunctiva/sclera: Conjunctivae normal.     Pupils: Pupils are equal, round, and reactive to light.  Cardiovascular:     Rate and Rhythm: Normal rate and regular rhythm.     Pulses: Normal pulses.     Heart sounds: Normal heart sounds.  Pulmonary:     Effort: Pulmonary effort is normal.     Breath sounds: Normal breath sounds.  Abdominal:     General: Abdomen is  flat. Bowel sounds are normal.     Palpations: Abdomen is soft.  Musculoskeletal:        General: Normal range of motion.     Cervical back: Normal range of motion.  Skin:    General: Skin is warm and dry.  Neurological:     General: No focal deficit present.     Mental Status: She is alert and oriented to person, place, and time. Mental status is at baseline.  Psychiatric:        Mood and Affect: Mood normal.        Behavior: Behavior normal.     EKG: none today  Recent Labs: 05/01/2024: BUN 13; Creatinine, Ser 0.79; Hemoglobin 11.4; Platelets 257; Potassium 4.0; Sodium 140 05/02/2024: ALT 48; Pro Brain Natriuretic Peptide 180.0    Lipid Panel    Component Value Date/Time   CHOL 205 (H) 05/01/2023 1202   TRIG 160 (H) 05/01/2023 1202   HDL 46 05/01/2023 1202   CHOLHDL 4.5 (H) 05/01/2023 1202   LDLCALC 130 (H) 05/01/2023 1202      ASSESSMENT AND PLAN:    ICD-10-CM   1. Chest pain, unspecified type  R07.9 Magnesium    CBC with Diff    Iron , TIBC and Ferritin Panel    TSH+T4F+T3Free    2. Bradycardia  R00.1 PCV ECHOCARDIOGRAM COMPLETE    CARDIAC EVENT MONITOR    Magnesium    CBC with Diff    Iron , TIBC and Ferritin Panel    TSH+T4F+T3Free    3. Shortness of breath  R06.02 Magnesium    CBC with Diff    Iron , TIBC and Ferritin Panel    TSH+T4F+T3Free       Problem List Items Addressed This Visit       Other   Bradycardia   Heart rate normal in office today. Will order an event monitor.      Relevant Orders   PCV ECHOCARDIOGRAM COMPLETE   CARDIAC EVENT MONITOR   Magnesium   CBC with Diff   Iron , TIBC and Ferritin Panel   TSH+T4F+T3Free   Chest pain - Primary   Normal EKG and troponin levels during recent ED visit. Will order an echocardiogram to rule out post partum cardiomyopathy. Will get lab work today to check thyroid , blood count and magnesium.       Relevant Orders   Magnesium   CBC with Diff   Iron , TIBC and Ferritin Panel   TSH+T4F+T3Free    Shortness of breath   Relevant Orders   Magnesium   CBC with Diff   Iron , TIBC and Ferritin Panel   TSH+T4F+T3Free     Disposition:   Return  in about 2 weeks (around 05/18/2024) for with SK.    Total time spent: 25 minutes  Signed,  Jeoffrey Pollen, NP  05/04/2024 11:39 AM    [facility]

## 2024-05-04 NOTE — Assessment & Plan Note (Addendum)
 Normal EKG and troponin levels during recent ED visit. Will order an echocardiogram to rule out post partum cardiomyopathy. Will get lab work today to check thyroid , blood count and magnesium.

## 2024-05-04 NOTE — Assessment & Plan Note (Signed)
 Heart rate normal in office today. Will order an event monitor.

## 2024-05-05 ENCOUNTER — Ambulatory Visit: Payer: Self-pay | Admitting: Cardiology

## 2024-05-05 ENCOUNTER — Ambulatory Visit: Admitting: Women's Health

## 2024-05-05 ENCOUNTER — Encounter: Payer: Self-pay | Admitting: Women's Health

## 2024-05-05 VITALS — BP 122/85 | HR 64 | Ht 79.0 in | Wt 188.2 lb

## 2024-05-05 DIAGNOSIS — O9943 Diseases of the circulatory system complicating the puerperium: Secondary | ICD-10-CM

## 2024-05-05 DIAGNOSIS — R001 Bradycardia, unspecified: Secondary | ICD-10-CM

## 2024-05-05 DIAGNOSIS — O906 Postpartum mood disturbance: Secondary | ICD-10-CM | POA: Diagnosis not present

## 2024-05-05 DIAGNOSIS — Z48816 Encounter for surgical aftercare following surgery on the genitourinary system: Secondary | ICD-10-CM

## 2024-05-05 DIAGNOSIS — Z3A00 Weeks of gestation of pregnancy not specified: Secondary | ICD-10-CM

## 2024-05-05 DIAGNOSIS — Z4889 Encounter for other specified surgical aftercare: Secondary | ICD-10-CM

## 2024-05-05 LAB — IRON,TIBC AND FERRITIN PANEL
Ferritin: 62 ng/mL (ref 15–150)
Iron Saturation: 16 % (ref 15–55)
Iron: 66 ug/dL (ref 27–159)
Total Iron Binding Capacity: 424 ug/dL (ref 250–450)
UIBC: 358 ug/dL (ref 131–425)

## 2024-05-05 LAB — CBC WITH DIFFERENTIAL/PLATELET
Basophils Absolute: 0 x10E3/uL (ref 0.0–0.2)
Basos: 1 %
EOS (ABSOLUTE): 0 x10E3/uL (ref 0.0–0.4)
Eos: 0 %
Hematocrit: 40.3 % (ref 34.0–46.6)
Hemoglobin: 13.2 g/dL (ref 11.1–15.9)
Immature Grans (Abs): 0 x10E3/uL (ref 0.0–0.1)
Immature Granulocytes: 0 %
Lymphocytes Absolute: 1.4 x10E3/uL (ref 0.7–3.1)
Lymphs: 20 %
MCH: 31.4 pg (ref 26.6–33.0)
MCHC: 32.8 g/dL (ref 31.5–35.7)
MCV: 96 fL (ref 79–97)
Monocytes Absolute: 0.4 x10E3/uL (ref 0.1–0.9)
Monocytes: 6 %
Neutrophils Absolute: 5.2 x10E3/uL (ref 1.4–7.0)
Neutrophils: 73 %
Platelets: 353 x10E3/uL (ref 150–450)
RBC: 4.21 x10E6/uL (ref 3.77–5.28)
RDW: 13.2 % (ref 11.7–15.4)
WBC: 7 x10E3/uL (ref 3.4–10.8)

## 2024-05-05 LAB — TSH+T4F+T3FREE
Free T4: 1.2 ng/dL (ref 0.82–1.77)
T3, Free: 2.4 pg/mL (ref 2.0–4.4)
TSH: 1.4 u[IU]/mL (ref 0.450–4.500)

## 2024-05-05 LAB — MAGNESIUM: Magnesium: 2.1 mg/dL (ref 1.6–2.3)

## 2024-05-05 NOTE — Patient Instructions (Signed)
You will have your sugar test next visit.  Please do not eat or drink anything after midnight the night before you come, not even water.  You will be here for at least two hours.  Please make an appointment online for the bloodwork at [email or web address] for 8:30am (or as close to this as possible). Make sure you select the [address] service center. The day of the appointment, check in with our office first, then you will go to [facility] to start the sugar test.   

## 2024-05-05 NOTE — Progress Notes (Signed)
 GYN VISIT Patient name: Stephanie Johns MRN [id number]  Date of birth: [birth year] Chief Complaint:   Post-op Follow-up (1 wk incision check)  History of Present Illness:   Stephanie Johns is a 28 y.o. G100P2012 Caucasian female 10d s/p RCS w/ BTS being seen today for incision check. Bottlefeeding. Went to ED 9/20 w/ CP, neg workup, saw cardiology yesterday, has episodes of bradycardia, has echo on Fri and f/u again w/ them next Monday.  Some pp blues, taking vistaril  prn anxiety and it helps.   No LMP recorded.     10/28/2023    3:52 PM 02/28/2023    3:31 PM 09/28/2021    9:35 AM 10/23/2019   11:07 AM 02/19/2018    3:02 PM  Depression screen PHQ 2/9  Decreased Interest 0 0 0 0 0  Down, Depressed, Hopeless 0 1 2 0 0  PHQ - 2 Score 0 1 2 0 0  Altered sleeping 2 2 2  1   Tired, decreased energy 0 1 0  0  Change in appetite 2 2 2   0  Feeling bad or failure about yourself  1 1 1   0  Trouble concentrating 2 3 1   0  Moving slowly or fidgety/restless 0 0 0  0  Suicidal thoughts 0 0 0  0  PHQ-9 Score 7 10 8  1         10/28/2023    3:52 PM 02/28/2023    3:31 PM 09/28/2021    9:35 AM  GAD 7 : Generalized Anxiety Score  Nervous, Anxious, on Edge 3 2 2   Control/stop worrying 3 2 2   Worry too much - different things 3 2 2   Trouble relaxing 3 2 2   Restless 3 2 2   Easily annoyed or irritable 3 3 2   Afraid - awful might happen 2 2 1   Total GAD 7 Score 20 15 13      Review of Systems:   Pertinent items are noted in HPI Denies fever/chills, dizziness, headaches, visual disturbances, fatigue, shortness of breath, chest pain, abdominal pain, vomiting, abnormal vaginal discharge/itching/odor/irritation, problems with periods, bowel movements, urination, or intercourse unless otherwise stated above.  Pertinent History Reviewed:  Reviewed past medical,surgical, social, obstetrical and family history.  Reviewed problem list, medications and allergies. Physical Assessment:   Vitals:   05/05/24  0916  BP: 122/85  Pulse: 64  Weight: 188 lb 3.2 oz (85.4 kg)  Height: 6' 7 (2.007 m)  Body mass index is 21.2 kg/m.       Physical Examination:   General appearance: alert, well appearing, and in no distress  Mental status: alert, oriented to person, place, and time  Skin: warm & dry   Cardiovascular: normal heart rate noted  Respiratory: normal respiratory effort, no distress  Abdomen: soft, non-tender, c/s incision healing well, no s/s infection  Pelvic: examination not indicated  Extremities: no edema   Chaperone: N/A  Results for orders placed or performed in visit on 05/04/24 (from the past 24 hours)  Magnesium   Collection Time: 05/04/24 11:34 AM  Result Value Ref Range   Magnesium 2.1 1.6 - 2.3 mg/dL  CBC with Diff   Collection Time: 05/04/24 11:34 AM  Result Value Ref Range   WBC 7.0 3.4 - 10.8 x10E3/uL   RBC 4.21 3.77 - 5.28 x10E6/uL   Hemoglobin 13.2 11.1 - 15.9 g/dL   Hematocrit 59.6 65.9 - 46.6 %   MCV 96 79 - 97 fL   MCH 31.4 26.6 -  33.0 pg   MCHC 32.8 31.5 - 35.7 g/dL   RDW 86.7 88.2 - 84.5 %   Platelets 353 150 - 450 x10E3/uL   Neutrophils 73 Not Estab. %   Lymphs 20 Not Estab. %   Monocytes 6 Not Estab. %   Eos 0 Not Estab. %   Basos 1 Not Estab. %   Neutrophils Absolute 5.2 1.4 - 7.0 x10E3/uL   Lymphocytes Absolute 1.4 0.7 - 3.1 x10E3/uL   Monocytes Absolute 0.4 0.1 - 0.9 x10E3/uL   EOS (ABSOLUTE) 0.0 0.0 - 0.4 x10E3/uL   Basophils Absolute 0.0 0.0 - 0.2 x10E3/uL   Immature Granulocytes 0 Not Estab. %   Immature Grans (Abs) 0.0 0.0 - 0.1 x10E3/uL  Iron , TIBC and Ferritin Panel   Collection Time: 05/04/24 11:34 AM  Result Value Ref Range   Total Iron  Binding Capacity 424 250 - 450 ug/dL   UIBC 641 868 - 574 ug/dL   Iron  66 27 - 159 ug/dL   Iron  Saturation 16 15 - 55 %   Ferritin 62 15 - 150 ng/mL  TSH+T4F+T3Free   Collection Time: 05/04/24 11:34 AM  Result Value Ref Range   TSH 1.400 0.450 - 4.500 uIU/mL   T3, Free 2.4 2.0 - 4.4 pg/mL    Free T4 1.20 0.82 - 1.77 ng/dL    Assessment & Plan:  1) 10d s/p RCS w/ BTS> bottlefeeding  2) Incision check> healing well, keep clean and dry  3) Episodes of bradycardia> has echo Fri and f/u again w/ cards on Mon, keep as scheduled, if anything worsens before go back to ED  4) PP blues> prn vistaril  helps  Meds: No orders of the defined types were placed in this encounter.   No orders of the defined types were placed in this encounter.   Return for add 2hr pp GTT to pp visit please.  Suzen JONELLE Fetters CNM, [facility] 05/05/2024 9:48 AM

## 2024-05-05 NOTE — Progress Notes (Signed)
 Patient informed.

## 2024-05-08 ENCOUNTER — Other Ambulatory Visit

## 2024-05-08 DIAGNOSIS — R001 Bradycardia, unspecified: Secondary | ICD-10-CM

## 2024-05-08 DIAGNOSIS — I340 Nonrheumatic mitral (valve) insufficiency: Secondary | ICD-10-CM | POA: Diagnosis not present

## 2024-05-11 ENCOUNTER — Ambulatory Visit

## 2024-05-11 ENCOUNTER — Encounter: Payer: Self-pay | Admitting: Cardiovascular Disease

## 2024-05-11 ENCOUNTER — Ambulatory Visit: Admitting: Cardiovascular Disease

## 2024-05-11 VITALS — BP 112/70 | HR 74 | Ht 66.0 in | Wt 187.6 lb

## 2024-05-11 DIAGNOSIS — R079 Chest pain, unspecified: Secondary | ICD-10-CM | POA: Diagnosis not present

## 2024-05-11 DIAGNOSIS — R0602 Shortness of breath: Secondary | ICD-10-CM | POA: Diagnosis not present

## 2024-05-11 DIAGNOSIS — R002 Palpitations: Secondary | ICD-10-CM | POA: Diagnosis not present

## 2024-05-11 DIAGNOSIS — R001 Bradycardia, unspecified: Secondary | ICD-10-CM | POA: Diagnosis not present

## 2024-05-11 DIAGNOSIS — Z0130 Encounter for examination of blood pressure without abnormal findings: Secondary | ICD-10-CM

## 2024-05-11 MED ORDER — MIDODRINE HCL 5 MG PO TABS: 5.0000 mg | ORAL_TABLET | Freq: Three times a day (TID) | ORAL | 3 refills | Status: AC

## 2024-05-11 NOTE — Progress Notes (Signed)
 Cardiology Office Note   Date:  05/11/2024   ID:  Stephanie Johns, DOB 1995/11/10, MRN [id number]  PCP:  Orlean Alan HERO, FNP  Cardiologist:  Denyse Bathe, MD      History of Present Illness: Stephanie Johns is a 28 y.o. female who presents for  Chief Complaint  Patient presents with   Follow-up    Echo results    HR fluctuates from 40s to 130, and feels dizzy, and off balance.      Past Medical History:  Diagnosis Date   Anxiety    Dysrhythmia    Gestational diabetes    High cholesterol    History of DVT of lower extremity    Vitamin D  deficiency      Past Surgical History:  Procedure Laterality Date   CESAREAN SECTION N/A 09/13/2018   Procedure: CESAREAN SECTION;  Surgeon: Fredirick Glenys RAMAN, MD;  Location: [facility];  Service: Obstetrics;  Laterality: N/A;   CESAREAN SECTION WITH BILATERAL TUBAL LIGATION N/A 04/25/2024   Procedure: CESAREAN SECTION, WITH BILATERAL TUBAL LIGATION;  Surgeon: Nicholaus Burnard HERO, MD;  Location: [facility];  Service: Obstetrics;  Laterality: N/A;   NO PAST SURGERIES       Current Outpatient Medications  Medication Sig Dispense Refill   enoxaparin  (LOVENOX ) 60 MG/0.6ML injection Inject 0.6 mLs (60 mg total) into the skin daily. 18 mL 6   hydrOXYzine  (ATARAX ) 10 MG tablet Take 1 tablet (10 mg total) by mouth 3 (three) times daily as needed for anxiety. 30 tablet 0   midodrine (PROAMATINE) 5 MG tablet Take 1 tablet (5 mg total) by mouth 3 (three) times daily with meals. 90 tablet 3   No current facility-administered medications for this visit.    Allergies:   Patient has no known allergies.    Social History:   reports that she has never smoked. She has never used smokeless tobacco. She reports that she does not currently use alcohol. She reports that she does not use drugs.   Family History:  family history includes Autism in her son; Cancer in her maternal grandmother; Diabetes in her maternal grandfather; Heart attack in  her maternal grandfather; Hyperlipidemia in her maternal grandfather and maternal grandmother; Hypertension in her maternal grandfather and mother; Other in her mother; Pneumonia in her maternal grandfather; Rheum arthritis in her mother.    ROS:     Review of Systems  Constitutional: Negative.   HENT: Negative.    Eyes: Negative.   Respiratory: Negative.    Gastrointestinal: Negative.   Genitourinary: Negative.   Musculoskeletal: Negative.   Skin: Negative.   Neurological: Negative.   Endo/Heme/Allergies: Negative.   Psychiatric/Behavioral: Negative.    All other systems reviewed and are negative.     All other systems are reviewed and negative.    PHYSICAL EXAM: VS:  BP 112/70   Pulse 74   Ht 5' 6 (1.676 m)   Wt 187 lb 9.6 oz (85.1 kg)   SpO2 98%   BMI 30.28 kg/m  , BMI Body mass index is 30.28 kg/m. Last weight:  Wt Readings from Last 3 Encounters:  05/11/24 187 lb 9.6 oz (85.1 kg)  05/05/24 188 lb 3.2 oz (85.4 kg)  05/04/24 189 lb (85.7 kg)     Physical Exam Constitutional:      Appearance: Normal appearance.  Cardiovascular:     Rate and Rhythm: Normal rate and regular rhythm.     Heart sounds: Normal heart sounds.  Pulmonary:     Effort: Pulmonary effort is normal.     Breath sounds: Normal breath sounds.  Musculoskeletal:     Right lower leg: No edema.     Left lower leg: No edema.  Neurological:     Mental Status: She is alert.       EKG:   Recent Labs: 05/01/2024: BUN 13; Creatinine, Ser 0.79; Potassium 4.0; Sodium 140 05/02/2024: ALT 48; Pro Brain Natriuretic Peptide 180.0 05/04/2024: Hemoglobin 13.2; Magnesium 2.1; Platelets 353; TSH 1.400    Lipid Panel    Component Value Date/Time   CHOL 205 (H) 05/01/2023 1202   TRIG 160 (H) 05/01/2023 1202   HDL 46 05/01/2023 1202   CHOLHDL 4.5 (H) 05/01/2023 1202   LDLCALC 130 (H) 05/01/2023 1202      Other studies Reviewed: Additional studies/ records that were reviewed today include:   Review of the above records demonstrates:       No data to display            ASSESSMENT AND PLAN:    ICD-10-CM   1. Bradycardia  R00.1 midodrine (PROAMATINE) 5 MG tablet   WILL START MIDRODINE    2. Shortness of breath  R06.02 midodrine (PROAMATINE) 5 MG tablet    3. Chest pain, unspecified type  R07.9 midodrine (PROAMATINE) 5 MG tablet   LVEF on echo 55% mild aortic regurgitation mild tricuspid regurgitation no evidence of postpartum cardiomyopathy.    4. Palpitations  R00.2 midodrine (PROAMATINE) 5 MG tablet       Problem List Items Addressed This Visit       Other   Palpitations   Relevant Medications   midodrine (PROAMATINE) 5 MG tablet   Bradycardia - Primary   Relevant Medications   midodrine (PROAMATINE) 5 MG tablet   Chest pain   Relevant Medications   midodrine (PROAMATINE) 5 MG tablet   Shortness of breath   Relevant Medications   midodrine (PROAMATINE) 5 MG tablet       Disposition:   Return in about 3 weeks (around 06/01/2024).    Total time spent: 35 minutes  Signed,  Denyse Bathe, MD  05/11/2024 10:03 AM    [facility]

## 2024-05-25 DIAGNOSIS — R001 Bradycardia, unspecified: Secondary | ICD-10-CM | POA: Diagnosis not present

## 2024-05-27 ENCOUNTER — Encounter: Payer: Self-pay | Admitting: Women's Health

## 2024-05-29 ENCOUNTER — Ambulatory Visit: Admitting: Obstetrics and Gynecology

## 2024-05-29 ENCOUNTER — Encounter: Payer: Self-pay | Admitting: Obstetrics and Gynecology

## 2024-05-29 VITALS — BP 124/91 | HR 65 | Ht 67.0 in | Wt 191.0 lb

## 2024-05-29 DIAGNOSIS — Z98891 History of uterine scar from previous surgery: Secondary | ICD-10-CM | POA: Diagnosis not present

## 2024-05-29 NOTE — Progress Notes (Signed)
    PostOp Visit Note  Stephanie Johns is a 28 y.o. G8P2012 female who presents for a postoperative visit. She is 4 weeks postop following a cesarean section. She felt something pop the other day, noticed some clear and bloody drainage. She denies pain or foul odor   Denies fever or chills.  Tolerating gen diet.  +Flatus, Regular BMs.   Overall doing well and reports no acute complaints   Review of Systems Pertinent items are noted in HPI.    Objective:  BP (!) 124/91 (BP Location: Right Arm, Patient Position: Sitting, Cuff Size: Normal)   Pulse 65   Ht 5' 7 (1.702 m)   Wt 191 lb (86.6 kg)   Breastfeeding No   BMI 29.91 kg/m    Physical Examination:  GENERAL ASSESSMENT: well developed and well nourished SKIN: normal color, no lesions CHEST: respiratory effort normal  ABDOMEN: soft, non-distended INCISION: well approximated, no drainage, no erythema, evidence of skin peeling   PSYCH: mood appropriate, normal affect       Assessment:    1. S/P cesarean section (Primary) Well approximated, monitor s&s, continue washing, can use Vaseline for peeling skin    Plan:   Return for pp visit    Nidia Daring, FNP [facility] [employer], [facility]

## 2024-06-01 ENCOUNTER — Ambulatory Visit: Admitting: Cardiovascular Disease

## 2024-06-02 ENCOUNTER — Other Ambulatory Visit

## 2024-06-02 ENCOUNTER — Ambulatory Visit (INDEPENDENT_AMBULATORY_CARE_PROVIDER_SITE_OTHER): Admitting: Women's Health

## 2024-06-02 ENCOUNTER — Encounter: Payer: Self-pay | Admitting: Women's Health

## 2024-06-02 DIAGNOSIS — Z98891 History of uterine scar from previous surgery: Secondary | ICD-10-CM | POA: Diagnosis not present

## 2024-06-02 DIAGNOSIS — Z86718 Personal history of other venous thrombosis and embolism: Secondary | ICD-10-CM

## 2024-06-02 DIAGNOSIS — Z8632 Personal history of gestational diabetes: Secondary | ICD-10-CM | POA: Diagnosis not present

## 2024-06-02 NOTE — Progress Notes (Signed)
 POSTPARTUM VISIT Patient name: Stephanie Johns MRN [id number]  Date of birth: 03/24/1996 Chief Complaint:   Postpartum Care (C-Section no problem)  History of Present Illness:   Stephanie Johns is a 28 y.o. G37P2012 Caucasian female being seen today for a postpartum visit. She is 5 weeks postpartum following a repeat cesarean section, low transverse incision and and BTS at 39.2 gestational weeks. IOL: no, for n/a. Anesthesia: spinal.  Laceration: n/a.  Complications: none. Inpatient contraception: yes BTS.   Pregnancy complicated by A1DM, h/o DVT. Tobacco use: no. Substance use disorder: no. Last pap smear: 02/28/23 and results were NILM w/ HRHPV negative. Next pap smear due: 2027 Patient's last menstrual period was 07/25/2023.  Postpartum course has been complicated by ED visit 9/20 w/ CP, neg workup, saw cardiology 9/22 & 9/29, has episodes of bradycardia; echo LVEF 55% mild aortic regurgitation mild tricuspid regurgitation no evidence of postpartum cardiomyopathy, was started on midodrine 9/29-didn't start. Waiting on results from Holter monitor then f/u.  Bleeding none. Bowel function is normal. Bladder function is normal. Urinary incontinence? no, fecal incontinence? no Patient is not sexually active. Last sexual activity: prior to birth of baby. Desired contraception: s/p BTS. Patient does not want a pregnancy in the future.  Desired family size is 2 children.   Upstream - 06/02/24 1052       Pregnancy Intention Screening   Does the patient want to become pregnant in the next year? No    Does the patient's partner want to become pregnant in the next year? No    Would the patient like to discuss contraceptive options today? No      Contraception Wrap Up   Current Method Female Sterilization    End Method Female Sterilization    Contraception Counseling Provided No         The pregnancy intention screening data noted above was reviewed. Potential methods of contraception were  discussed. The patient elected to proceed with Female Sterilization.  Edinburgh Postpartum Depression Screening: negative  Edinburgh Postnatal Depression Scale - 06/02/24 1052       Edinburgh Postnatal Depression Scale:  In the Past 7 Days   I have been able to laugh and see the funny side of things. 0    I have looked forward with enjoyment to things. 0    I have blamed myself unnecessarily when things went wrong. 2    I have been anxious or worried for no good reason. 2    I have felt scared or panicky for no good reason. 2    Things have been getting on top of me. 0    I have been so unhappy that I have had difficulty sleeping. 0    I have felt sad or miserable. 1    I have been so unhappy that I have been crying. 1    The thought of harming myself has occurred to me. 0    Edinburgh Postnatal Depression Scale Total 8             10/28/2023    3:52 PM 02/28/2023    3:31 PM 09/28/2021    9:35 AM  GAD 7 : Generalized Anxiety Score  Nervous, Anxious, on Edge 3 2 2   Control/stop worrying 3 2 2   Worry too much - different things 3 2 2   Trouble relaxing 3 2 2   Restless 3 2 2   Easily annoyed or irritable 3 3 2   Afraid - awful might happen 2 2  1  Total GAD 7 Score 20 15 13      Baby's course has been uncomplicated. Baby is feeding by bottle. Infant has a pediatrician/family doctor? Yes.  Childcare strategy if returning to work/school: n/a-working from home.  Pt has material needs met for her and baby: Yes.   Review of Systems:   Pertinent items are noted in HPI Denies Abnormal vaginal discharge w/ itching/odor/irritation, headaches, visual changes, shortness of breath, chest pain, abdominal pain, severe nausea/vomiting, or problems with urination or bowel movements. Pertinent History Reviewed:  Reviewed past medical,surgical, obstetrical and family history.  Reviewed problem list, medications and allergies. OB History  Gravida Para Term Preterm AB Living  3 2 2  1 2   SAB IAB  Ectopic Multiple Live Births  1   0 2    # Outcome Date GA Lbr Len/2nd Weight Sex Type Anes PTL Lv  3 Term 04/25/24 [redacted]w[redacted]d  7 lb 4.1 oz (3.29 kg) F CS-LTranv Spinal  LIV  2 Term 09/13/18 [redacted]w[redacted]d  7 lb 15 oz (3.6 kg) M CS-LTranv Spinal  LIV     Birth Comments: ROM/breech  1 SAB            Physical Assessment:   Vitals:   06/02/24 1046  BP: 115/76  Pulse: 65  Weight: 191 lb (86.6 kg)  Height: 5' 7 (1.702 m)  Body mass index is 29.91 kg/m.       Physical Examination:   General appearance: alert, well appearing, and in no distress  Mental status: alert, oriented to person, place, and time  Skin: warm & dry   Cardiovascular: normal heart rate noted   Respiratory: normal respiratory effort, no distress   Breasts: deferred, no complaints   Abdomen: soft, non-tender, c/s incision well healed, no s/s infection  Pelvic: examination not indicated. Thin prep pap obtained: No  Rectal: not examined  Extremities: Edema: none   Chaperone: N/A       No results found for this or any previous visit (from the past 24 hours).  Assessment & Plan:  1) Postpartum exam 2) 5 wks s/p repeat cesarean section, low transverse incision w/ BTS 3) bottle feeding 4) Depression screening 5) Contraception s/p BTS 6) A1DM during pregnancy> GTT today 7) Bradycardia> being follwed by cardiology, reports improving w/o rx 8) H/O DVT> stop lovenox  on 10/25 (6w pp), transition back to Xarelto w/ PCP  Essential components of care per ACOG recommendations:  1.  Mood and well being:  If positive depression screen, discussed and plan developed.  If using tobacco we discussed reduction/cessation and risk of relapse If current substance abuse, we discussed and referral to local resources was offered.   2. Infant care and feeding:  If breastfeeding, discussed returning to work, pumping, breastfeeding-associated pain, guidance regarding return to fertility while lactating if not using another method. If needed,  patient was provided with a letter to be allowed to pump q 2-3hrs to support lactation in a private location with access to a refrigerator to store breastmilk.   Recommended that all caregivers be immunized for flu, pertussis and other preventable communicable diseases If pt does not have material needs met for her/baby, referred to local resources for help obtaining these.  3. Sexuality, contraception and birth spacing Provided guidance regarding sexuality, management of dyspareunia, and resumption of intercourse Discussed avoiding interpregnancy interval <25mths and recommended birth spacing of 18 months  4. Sleep and fatigue Discussed coping options for fatigue and sleep disruption Encouraged family/partner/community support of 4  hrs of uninterrupted sleep to help with mood and fatigue  5. Physical recovery  If pt had a C/S, assessed incisional pain and providing guidance on normal vs prolonged recovery If pt had a laceration, perineal healing and pain reviewed.  If urinary or fecal incontinence, discussed management and referred to PT or uro/gyn if indicated  Patient is safe to resume physical activity. Discussed attainment of healthy weight.  6.  Chronic disease management Discussed pregnancy complications if any, and their implications for future childbearing and long-term maternal health. Review recommendations for prevention of recurrent pregnancy complications, such as 17 hydroxyprogesterone caproate to reduce risk for recurrent PTB not applicable, or aspirin to reduce risk of preeclampsia not applicable. Pt had GDM: yes. If yes, 2hr GTT scheduled: yes. Reviewed medications and non-pregnant dosing including consideration of whether pt is breastfeeding using a reliable resource such as LactMed: not applicable Referred for f/u w/ PCP or subspecialist providers as indicated: yes  7. Health maintenance Mammogram at 28yo or earlier if indicated Pap smears as indicated  Meds: No orders  of the defined types were placed in this encounter.   Follow-up: Return in about 1 year (around 06/02/2025) for Physical.   No orders of the defined types were placed in this encounter.   Suzen JONELLE Fetters CNM, [facility] 06/02/2024 11:13 AM

## 2024-06-03 ENCOUNTER — Ambulatory Visit: Payer: Self-pay | Admitting: Women's Health

## 2024-06-03 DIAGNOSIS — Z8632 Personal history of gestational diabetes: Secondary | ICD-10-CM

## 2024-06-03 LAB — GLUCOSE TOLERANCE, 2 HOURS W/ 1HR
Glucose, 1 hour: 126 mg/dL (ref 70–179)
Glucose, 2 hour: 83 mg/dL (ref 70–152)
Glucose, Fasting: 83 mg/dL (ref 70–91)

## 2024-06-19 ENCOUNTER — Telehealth: Payer: Self-pay

## 2024-06-19 NOTE — Telephone Encounter (Signed)
 Patient called in asking for samples of xarelto states that he just finished her Lovenox  injections, do you want her to stay on the xarelto or can she switch to eliquis, we still get samples of the eliquis but not longer get the xarelto

## 2024-06-22 ENCOUNTER — Other Ambulatory Visit: Payer: Self-pay

## 2024-06-22 MED ORDER — RIVAROXABAN 20 MG PO TABS: 20.0000 mg | ORAL_TABLET | Freq: Every day | ORAL | 11 refills | Status: DC

## 2024-06-22 NOTE — Telephone Encounter (Signed)
Rx sent per pt request 

## 2024-07-07 ENCOUNTER — Encounter: Payer: Self-pay | Admitting: Women's Health

## 2024-07-21 ENCOUNTER — Encounter: Payer: Self-pay | Admitting: Family

## 2024-07-24 ENCOUNTER — Other Ambulatory Visit: Payer: Self-pay

## 2024-07-24 MED ORDER — CLOPIDOGREL BISULFATE 75 MG PO TABS: 75.0000 mg | ORAL_TABLET | Freq: Every day | ORAL | 11 refills | Status: AC

## 2024-08-20 ENCOUNTER — Encounter: Payer: Self-pay | Admitting: Family

## 2024-08-21 ENCOUNTER — Other Ambulatory Visit: Payer: Self-pay

## 2024-08-21 MED ORDER — AMOXICILLIN-POT CLAVULANATE 875-125 MG PO TABS: 1.0000 | ORAL_TABLET | Freq: Two times a day (BID) | ORAL | 0 refills | Status: AC

## 2024-08-31 ENCOUNTER — Other Ambulatory Visit: Payer: Self-pay

## 2024-08-31 DIAGNOSIS — E039 Hypothyroidism, unspecified: Secondary | ICD-10-CM

## 2024-08-31 DIAGNOSIS — I10 Essential (primary) hypertension: Secondary | ICD-10-CM

## 2024-08-31 DIAGNOSIS — R7303 Prediabetes: Secondary | ICD-10-CM

## 2024-08-31 DIAGNOSIS — E782 Mixed hyperlipidemia: Secondary | ICD-10-CM

## 2024-09-05 LAB — LIPID PANEL
Chol/HDL Ratio: 3.4 ratio (ref 0.0–4.4)
Cholesterol, Total: 193 mg/dL (ref 100–199)
HDL: 57 mg/dL
LDL Chol Calc (NIH): 119 mg/dL — ABNORMAL HIGH (ref 0–99)
Triglycerides: 93 mg/dL (ref 0–149)
VLDL Cholesterol Cal: 17 mg/dL (ref 5–40)

## 2024-09-05 LAB — CBC WITH DIFFERENTIAL/PLATELET
Basophils Absolute: 0 10*3/uL (ref 0.0–0.2)
Basos: 1 %
EOS (ABSOLUTE): 0.1 10*3/uL (ref 0.0–0.4)
Eos: 1 %
Hematocrit: 40.1 % (ref 34.0–46.6)
Hemoglobin: 13.2 g/dL (ref 11.1–15.9)
Immature Grans (Abs): 0 10*3/uL (ref 0.0–0.1)
Immature Granulocytes: 0 %
Lymphocytes Absolute: 1.4 10*3/uL (ref 0.7–3.1)
Lymphs: 17 %
MCH: 30.2 pg (ref 26.6–33.0)
MCHC: 32.9 g/dL (ref 31.5–35.7)
MCV: 92 fL (ref 79–97)
Monocytes Absolute: 0.6 10*3/uL (ref 0.1–0.9)
Monocytes: 8 %
Neutrophils Absolute: 6.2 10*3/uL (ref 1.4–7.0)
Neutrophils: 73 %
Platelets: 253 10*3/uL (ref 150–450)
RBC: 4.37 x10E6/uL (ref 3.77–5.28)
RDW: 12.5 % (ref 11.7–15.4)
WBC: 8.3 10*3/uL (ref 3.4–10.8)

## 2024-09-05 LAB — CMP14+EGFR
ALT: 19 [IU]/L (ref 0–32)
AST: 20 [IU]/L (ref 0–40)
Albumin: 4.6 g/dL (ref 4.0–5.0)
Alkaline Phosphatase: 91 [IU]/L (ref 41–116)
BUN/Creatinine Ratio: 16 (ref 9–23)
BUN: 10 mg/dL (ref 6–20)
Bilirubin Total: 0.5 mg/dL (ref 0.0–1.2)
CO2: 23 mmol/L (ref 20–29)
Calcium: 9.6 mg/dL (ref 8.7–10.2)
Chloride: 99 mmol/L (ref 96–106)
Creatinine, Ser: 0.64 mg/dL (ref 0.57–1.00)
Globulin, Total: 2.8 g/dL (ref 1.5–4.5)
Glucose: 80 mg/dL (ref 70–99)
Potassium: 4.2 mmol/L (ref 3.5–5.2)
Sodium: 136 mmol/L (ref 134–144)
Total Protein: 7.4 g/dL (ref 6.0–8.5)
eGFR: 123 mL/min/{1.73_m2}

## 2024-09-05 LAB — TSH: TSH: 2.4 u[IU]/mL (ref 0.450–4.500)

## 2024-09-05 LAB — ACUTE HEP PANEL AND HEP B SURFACE AB
Hep A IgM: NEGATIVE
Hep B C IgM: NEGATIVE
Hep C Virus Ab: NONREACTIVE
Hepatitis B Surf Ab Quant: 354 m[IU]/mL
Hepatitis B Surface Ag: NEGATIVE

## 2024-09-05 LAB — HEMOGLOBIN A1C
Est. average glucose Bld gHb Est-mCnc: 108 mg/dL
Hgb A1c MFr Bld: 5.4 % (ref 4.8–5.6)

## 2024-09-17 ENCOUNTER — Ambulatory Visit: Payer: Self-pay
